# Patient Record
Sex: Male | Born: 1960 | Race: White | Hispanic: No | Marital: Married | Smoking: Former smoker
Health system: Southern US, Community
[De-identification: ages and names within clinical notes are randomized; demographics above are authoritative.]

## PROBLEM LIST (undated history)

## (undated) DIAGNOSIS — I480 Paroxysmal atrial fibrillation: Secondary | ICD-10-CM

## (undated) DIAGNOSIS — R918 Other nonspecific abnormal finding of lung field: Secondary | ICD-10-CM

## (undated) DIAGNOSIS — I340 Nonrheumatic mitral (valve) insufficiency: Secondary | ICD-10-CM

## (undated) DIAGNOSIS — I700 Atherosclerosis of aorta: Secondary | ICD-10-CM

## (undated) DIAGNOSIS — I499 Cardiac arrhythmia, unspecified: Secondary | ICD-10-CM

## (undated) DIAGNOSIS — I493 Ventricular premature depolarization: Secondary | ICD-10-CM

## (undated) DIAGNOSIS — I350 Nonrheumatic aortic (valve) stenosis: Secondary | ICD-10-CM

## (undated) DIAGNOSIS — C801 Malignant (primary) neoplasm, unspecified: Secondary | ICD-10-CM

## (undated) DIAGNOSIS — J189 Pneumonia, unspecified organism: Secondary | ICD-10-CM

## (undated) DIAGNOSIS — I4891 Unspecified atrial fibrillation: Secondary | ICD-10-CM

## (undated) DIAGNOSIS — I10 Essential (primary) hypertension: Secondary | ICD-10-CM

## (undated) DIAGNOSIS — I77810 Thoracic aortic ectasia: Secondary | ICD-10-CM

## (undated) DIAGNOSIS — K219 Gastro-esophageal reflux disease without esophagitis: Secondary | ICD-10-CM

## (undated) DIAGNOSIS — R931 Abnormal findings on diagnostic imaging of heart and coronary circulation: Secondary | ICD-10-CM

## (undated) DIAGNOSIS — R221 Localized swelling, mass and lump, neck: Secondary | ICD-10-CM

## (undated) DIAGNOSIS — E119 Type 2 diabetes mellitus without complications: Secondary | ICD-10-CM

## (undated) DIAGNOSIS — I6529 Occlusion and stenosis of unspecified carotid artery: Secondary | ICD-10-CM

## (undated) DIAGNOSIS — E7800 Pure hypercholesterolemia, unspecified: Secondary | ICD-10-CM

## (undated) DIAGNOSIS — I2510 Atherosclerotic heart disease of native coronary artery without angina pectoris: Secondary | ICD-10-CM

## (undated) DIAGNOSIS — Z87442 Personal history of urinary calculi: Secondary | ICD-10-CM

## (undated) HISTORY — DX: Cardiac arrhythmia, unspecified: I49.9

## (undated) HISTORY — PX: BACK SURGERY: SHX140

## (undated) HISTORY — DX: Occlusion and stenosis of unspecified carotid artery: I65.29

## (undated) HISTORY — DX: Abnormal findings on diagnostic imaging of heart and coronary circulation: R93.1

## (undated) HISTORY — DX: Malignant (primary) neoplasm, unspecified: C80.1

---

## 2003-06-22 ENCOUNTER — Ambulatory Visit: Admission: RE | Admit: 2003-06-22 | Discharge: 2003-06-22 | Payer: Self-pay | Admitting: Neurosurgery

## 2003-07-04 ENCOUNTER — Ambulatory Visit (HOSPITAL_COMMUNITY): Admission: RE | Admit: 2003-07-04 | Discharge: 2003-07-04 | Payer: Self-pay | Admitting: Neurosurgery

## 2003-08-01 ENCOUNTER — Encounter (HOSPITAL_COMMUNITY): Admission: RE | Admit: 2003-08-01 | Discharge: 2003-08-31 | Payer: Self-pay | Admitting: Neurosurgery

## 2003-09-18 ENCOUNTER — Encounter (HOSPITAL_COMMUNITY): Admission: RE | Admit: 2003-09-18 | Discharge: 2003-10-18 | Payer: Self-pay | Admitting: Neurosurgery

## 2003-10-19 ENCOUNTER — Encounter (HOSPITAL_COMMUNITY): Admission: RE | Admit: 2003-10-19 | Discharge: 2003-11-18 | Payer: Self-pay | Admitting: Neurosurgery

## 2003-11-20 ENCOUNTER — Encounter (HOSPITAL_COMMUNITY): Admission: RE | Admit: 2003-11-20 | Discharge: 2003-12-20 | Payer: Self-pay | Admitting: Neurosurgery

## 2003-12-22 ENCOUNTER — Encounter (HOSPITAL_COMMUNITY): Admission: RE | Admit: 2003-12-22 | Discharge: 2004-01-21 | Payer: Self-pay | Admitting: Neurosurgery

## 2004-01-22 ENCOUNTER — Encounter (HOSPITAL_COMMUNITY): Admission: RE | Admit: 2004-01-22 | Discharge: 2004-02-02 | Payer: Self-pay | Admitting: Neurosurgery

## 2012-11-23 ENCOUNTER — Other Ambulatory Visit (HOSPITAL_COMMUNITY): Payer: Self-pay | Admitting: Internal Medicine

## 2012-11-23 DIAGNOSIS — R319 Hematuria, unspecified: Secondary | ICD-10-CM

## 2012-11-24 ENCOUNTER — Ambulatory Visit (HOSPITAL_COMMUNITY)
Admission: RE | Admit: 2012-11-24 | Discharge: 2012-11-24 | Disposition: A | Discharge status: Home / Self Care | Payer: BC Managed Care – PPO | Source: Ambulatory Visit | Attending: Internal Medicine | Admitting: Internal Medicine

## 2012-11-24 DIAGNOSIS — R933 Abnormal findings on diagnostic imaging of other parts of digestive tract: Secondary | ICD-10-CM | POA: Insufficient documentation

## 2012-11-24 DIAGNOSIS — E278 Other specified disorders of adrenal gland: Secondary | ICD-10-CM | POA: Insufficient documentation

## 2012-11-24 DIAGNOSIS — R319 Hematuria, unspecified: Secondary | ICD-10-CM

## 2012-11-24 DIAGNOSIS — N400 Enlarged prostate without lower urinary tract symptoms: Secondary | ICD-10-CM | POA: Insufficient documentation

## 2012-11-24 LAB — POCT I-STAT, CHEM 8
Calcium, Ion: 1.19 mmol/L (ref 1.12–1.23)
Glucose, Bld: 118 mg/dL — ABNORMAL HIGH (ref 70–99)
HCT: 47 % (ref 39.0–52.0)
Hemoglobin: 16 g/dL (ref 13.0–17.0)
TCO2: 27 mmol/L (ref 0–100)

## 2012-11-24 MED ORDER — IOHEXOL 300 MG/ML  SOLN: 150.0000 mL | Freq: Once | INTRAMUSCULAR | Status: DC | PRN

## 2012-11-24 MED ORDER — IOHEXOL 300 MG/ML  SOLN
125.0000 mL | Freq: Once | INTRAMUSCULAR | Status: AC | PRN
  Administered 2012-11-24: 125 mL via INTRAVENOUS

## 2015-10-18 ENCOUNTER — Encounter (HOSPITAL_COMMUNITY): Payer: Self-pay

## 2015-10-18 ENCOUNTER — Emergency Department (HOSPITAL_COMMUNITY): Payer: BLUE CROSS/BLUE SHIELD

## 2015-10-18 ENCOUNTER — Emergency Department (HOSPITAL_COMMUNITY)
Admission: EM | Admit: 2015-10-18 | Discharge: 2015-10-18 | Disposition: A | Discharge status: Home / Self Care | Payer: BLUE CROSS/BLUE SHIELD | Attending: Emergency Medicine | Admitting: Emergency Medicine

## 2015-10-18 DIAGNOSIS — Z79891 Long term (current) use of opiate analgesic: Secondary | ICD-10-CM | POA: Insufficient documentation

## 2015-10-18 DIAGNOSIS — W182XXA Fall in (into) shower or empty bathtub, initial encounter: Secondary | ICD-10-CM | POA: Insufficient documentation

## 2015-10-18 DIAGNOSIS — F17200 Nicotine dependence, unspecified, uncomplicated: Secondary | ICD-10-CM | POA: Insufficient documentation

## 2015-10-18 DIAGNOSIS — S0081XA Abrasion of other part of head, initial encounter: Secondary | ICD-10-CM | POA: Diagnosis not present

## 2015-10-18 DIAGNOSIS — Y939 Activity, unspecified: Secondary | ICD-10-CM | POA: Diagnosis not present

## 2015-10-18 DIAGNOSIS — S299XXA Unspecified injury of thorax, initial encounter: Secondary | ICD-10-CM | POA: Diagnosis present

## 2015-10-18 DIAGNOSIS — E119 Type 2 diabetes mellitus without complications: Secondary | ICD-10-CM | POA: Diagnosis not present

## 2015-10-18 DIAGNOSIS — Z79899 Other long term (current) drug therapy: Secondary | ICD-10-CM | POA: Diagnosis not present

## 2015-10-18 DIAGNOSIS — S2242XA Multiple fractures of ribs, left side, initial encounter for closed fracture: Secondary | ICD-10-CM | POA: Diagnosis not present

## 2015-10-18 DIAGNOSIS — Y929 Unspecified place or not applicable: Secondary | ICD-10-CM | POA: Insufficient documentation

## 2015-10-18 DIAGNOSIS — Z7984 Long term (current) use of oral hypoglycemic drugs: Secondary | ICD-10-CM | POA: Diagnosis not present

## 2015-10-18 DIAGNOSIS — S2232XA Fracture of one rib, left side, initial encounter for closed fracture: Secondary | ICD-10-CM

## 2015-10-18 DIAGNOSIS — Y999 Unspecified external cause status: Secondary | ICD-10-CM | POA: Insufficient documentation

## 2015-10-18 HISTORY — DX: Type 2 diabetes mellitus without complications: E11.9

## 2015-10-18 MED ORDER — METHOCARBAMOL 500 MG PO TABS
1000.0000 mg | ORAL_TABLET | Freq: Once | ORAL | Status: AC
  Administered 2015-10-18: 1000 mg via ORAL
  Filled 2015-10-18: qty 2

## 2015-10-18 MED ORDER — HYDROCODONE-ACETAMINOPHEN 7.5-325 MG PO TABS: 1.0000 | ORAL_TABLET | ORAL | Status: DC | PRN

## 2015-10-18 MED ORDER — METHOCARBAMOL 500 MG PO TABS: 500.0000 mg | ORAL_TABLET | Freq: Three times a day (TID) | ORAL | Status: DC

## 2015-10-18 MED ORDER — HYDROCODONE-ACETAMINOPHEN 5-325 MG PO TABS
2.0000 | ORAL_TABLET | Freq: Once | ORAL | Status: AC
  Administered 2015-10-18: 2 via ORAL
  Filled 2015-10-18: qty 2

## 2015-10-18 NOTE — ED Provider Notes (Signed)
CSN: [id number]     Arrival date & time 10/18/15  1253 History   First MD Initiated Contact with Patient 10/18/15 1432     Chief Complaint  Patient presents with  . Fall     (Consider location/radiation/quality/duration/timing/severity/associated sxs/prior Treatment) HPI Comments: Patient is a 55 year old male who presents to the emergency department with a complaint of left rib pain, an abrasion to the left face.  The patient states that on last evening he slipped in the bathtub, fell and injured the left ribs. He also struck his left face and sustained an abrasion. He states that today he has a very minimal discomfort as far as his face is concerned, but has increasing pain through the night and into today in the left rib area. He denies any hemoptysis. He denies any difficulty with breathing, but states that it hurts to take a deep breath or cough. He denies being on any anticoagulation medications.  Patient is a 55 y.o. male presenting with fall. The history is provided by the patient.  Fall This is a new problem. The current episode started yesterday.    Past Medical History  Diagnosis Date  . Diabetes mellitus without complication [facility])    Past Surgical History  Procedure Laterality Date  . Back surgery     No family history on file. Social History  Substance Use Topics  . Smoking status: Current Every Day Smoker  . Smokeless tobacco: None  . Alcohol Use: No    Review of Systems  Respiratory:       Chest wall pain  Musculoskeletal: Positive for back pain.  All other systems reviewed and are negative.     Allergies  Review of patient's allergies indicates no known allergies.  Home Medications   Prior to Admission medications   Medication Sig Start Date End Date Taking? Authorizing Provider  ALPRAZolam Duanne Moron) 1 MG tablet Take 1 mg by mouth 3 (three) times daily.  09/25/15  Yes Historical Provider, MD  atorvastatin (LIPITOR) 10 MG tablet Take 10 mg by mouth  daily. 10/10/15  Yes Historical Provider, MD  esomeprazole (NEXIUM) 40 MG capsule Take 40 mg by mouth daily. 10/10/15  Yes Historical Provider, MD  HYDROcodone-acetaminophen (NORCO) 10-325 MG tablet take 1 tablet by mouth every 6 hours if needed 09/18/15  Yes Historical Provider, MD  lisinopril (PRINIVIL,ZESTRIL) 20 MG tablet Take 20 mg by mouth at bedtime.  09/28/15  Yes Historical Provider, MD  metFORMIN (GLUCOPHAGE) 1000 MG tablet Take 1,000 mg by mouth 2 (two) times daily. 10/10/15  Yes Historical Provider, MD  pioglitazone (ACTOS) 15 MG tablet Take 15 mg by mouth at bedtime.  09/21/15  Yes Historical Provider, MD   BP 140/78 mmHg  Pulse 83  Temp(Src) 97.8 F (36.6 C) (Oral)  Resp 18  Ht 5\' 10"  (1.778 m)  Wt 96.163 kg  BMI 30.42 kg/m2  SpO2 98% Physical Exam  Constitutional: He is oriented to person, place, and time. He appears well-developed and well-nourished.  Non-toxic appearance.  HENT:  Head: Normocephalic.  Right Ear: Tympanic membrane and external ear normal.  Left Ear: Tympanic membrane and external ear normal.  There is an abrasion of the left cheek. There is no deformity appreciated.     Eyes: EOM and lids are normal. Pupils are equal, round, and reactive to light.  Neck: Normal range of motion. Neck supple. Carotid bruit is not present.  Cardiovascular: Normal rate, regular rhythm, normal heart sounds, intact distal pulses and normal pulses.  Pulmonary/Chest: Breath sounds normal. No respiratory distress.  There is a bruise to the left lateral chest wall on. There is pain to palpation of the left lateral and anterior ribs on. There is symmetrical rise and fall of the chest. There is pain with deep breathing on the left side.  Abdominal: Soft. Bowel sounds are normal. There is no tenderness. There is no guarding.  Musculoskeletal: Normal range of motion.  Lymphadenopathy:       Head (right side): No submandibular adenopathy present.       Head (left side): No submandibular  adenopathy present.    He has no cervical adenopathy.  Neurological: He is alert and oriented to person, place, and time. He has normal strength. No cranial nerve deficit or sensory deficit.  Skin: Skin is warm and dry.  Psychiatric: He has a normal mood and affect. His speech is normal.  Nursing note and vitals reviewed.   ED Course  Procedures (including critical care time)  FRACTURE CARE Patient sustained a fall on last evening and unfortunately broke to possibly 3 ribs on the left. I discussed the fractures with the patient in terms which he understands. His pulse oximetry is 98% on room air. He speaks in complete sentences without problem.  Discussed with the patient the need for incentive spirometry as part of his treatment for the rib fractures. The patient identified by arm band. Incentive spirometer ordered for the patient. Patient treated for pain with Robaxin and Norco. The patient expresses understanding of the discharge instructions. Labs Review Labs Reviewed - No data to display  Imaging Review Dg Ribs Unilateral W/chest Left  10/18/2015  CLINICAL DATA:  [place] out of tub last night, left posterior rib pain EXAM: LEFT RIBS AND CHEST - 3+ VIEW COMPARISON:  None. FINDINGS: Three views left ribs submitted. No infiltrate or pulmonary edema. There is no pneumothorax. There is nondisplaced fracture of the left seventh rib. Mild displaced fracture of the left eighth rib. Question nondisplaced fracture of of the left ninth rib air. IMPRESSION: No infiltrate or pulmonary edema. Nondisplaced fracture of the left seventh rib. Mild displaced fracture of the left eighth rib. Question nondisplaced fracture of the left ninth rib. No pneumothorax. Electronically Signed   By: Lahoma Crocker M.D.   On: 10/18/2015 13:29   I have personally reviewed and evaluated these images and lab results as part of my medical decision-making.   EKG Interpretation None      MDM  Vital signs within normal  limits. The pulse oximetry is 98% on room air. The x-ray reveals a fracture of the left seventh and eighth rib. There is also a question of fracture of the ninth rib.  The patient will be treated with an incentive spirometer, antispasmodic, and pain medication. I discussed with the patient the importance of frequent deep breaths. Also discussed with him that it'll probably take 6-8 weeks for this to heal. The patient is to follow-up with his primary physician to monitor this while healing. The patient is in agreement with this discharge plan.    Final diagnoses:  None    *I have reviewed nursing notes, vital signs, and all appropriate lab and imaging results for this patient.**    Lily Kocher, PA-C 10/18/15 [place], PA-C 10/18/15 1517  Virgel Manifold, MD 10/30/15 [phone]

## 2015-10-18 NOTE — ED Notes (Signed)
Patient stating he is wanting to leave. Nurse made multiple attempts to obtain incentive spirometer from respiratory. Instructed patient to cough turn and deep breath every 3-4 hours. Patient verbalizes understanding.

## 2015-10-18 NOTE — Discharge Instructions (Signed)
Your x-ray reveals a fracture of the left seventh and eighth rib. There is also a suspicion of an area on the ninth rib. Please use the incentive spirometer 3 or 4 times each hour. Please see Dr. Willey Blade, or member of his team to monitor your healing, and also to assist you with pain management. Please use 600 mg of ibuprofen with breakfast, lunch, and dinner. Use Robaxin 3 times daily for the spasm pain associated with your injury. May use Norco for more severe pain. Robaxin and Norco may cause drowsiness, please use these medications with caution. Rib Fracture A rib fracture is a break or crack in one of the bones of the ribs. The ribs are like a cage that goes around your upper chest. A broken or cracked rib is often painful, but most do not cause other problems. Most rib fractures heal on their own in 1-3 months. HOME CARE  Avoid activities that cause pain to the injured area. Protect your injured area.  Slowly increase activity as told by your doctor.  Take medicine as told by your doctor.  Put ice on the injured area for the first 1-2 days after you have been treated or as told by your doctor.  Put ice in a plastic bag.  Place a towel between your skin and the bag.  Leave the ice on for 15-20 minutes at a time, every 2 hours while you are awake.  Do deep breathing as told by your doctor. You may be told to:  Take deep breaths many times a day.  Cough many times a day while hugging a pillow.  Use a device (incentive spirometer) to perform deep breathing many times a day.  Drink enough fluids to keep your pee (urine) clear or pale yellow.   Do not wear a rib belt or binder. These do not allow you to breathe deeply. GET HELP RIGHT AWAY IF:   You have a fever.  You have trouble breathing.   You cannot stop coughing.  You cough up thick or bloody spit (mucus).   You feel sick to your stomach (nauseous), throw up (vomit), or have belly (abdominal) pain.   Your pain gets  worse and medicine does not help.  MAKE SURE YOU:   Understand these instructions.  Will watch your condition.  Will get help right away if you are not doing well or get worse.   This information is not intended to replace advice given to you by your health care provider. Make sure you discuss any questions you have with your health care provider.   Document Released: 01/29/2008 Document Revised: 08/16/2012 Document Reviewed: 06/23/2012 Elsevier Interactive Patient Education [employer].

## 2015-10-18 NOTE — ED Notes (Signed)
Pt reports slipped in the tub and fell and c/o pain to left ribs.  Pt also has abrasion to left side of face but says the pain in his side is worse.  Denies any LOC.

## 2015-10-24 ENCOUNTER — Emergency Department (HOSPITAL_COMMUNITY): Payer: BLUE CROSS/BLUE SHIELD

## 2015-10-24 ENCOUNTER — Encounter (HOSPITAL_COMMUNITY): Payer: Self-pay

## 2015-10-24 ENCOUNTER — Emergency Department (HOSPITAL_COMMUNITY)
Admission: EM | Admit: 2015-10-24 | Discharge: 2015-10-24 | Disposition: A | Discharge status: Home / Self Care | Payer: BLUE CROSS/BLUE SHIELD | Attending: Emergency Medicine | Admitting: Emergency Medicine

## 2015-10-24 DIAGNOSIS — S299XXA Unspecified injury of thorax, initial encounter: Secondary | ICD-10-CM | POA: Diagnosis present

## 2015-10-24 DIAGNOSIS — S2242XD Multiple fractures of ribs, left side, subsequent encounter for fracture with routine healing: Secondary | ICD-10-CM | POA: Insufficient documentation

## 2015-10-24 DIAGNOSIS — Y92002 Bathroom of unspecified non-institutional (private) residence single-family (private) house as the place of occurrence of the external cause: Secondary | ICD-10-CM | POA: Insufficient documentation

## 2015-10-24 DIAGNOSIS — R109 Unspecified abdominal pain: Secondary | ICD-10-CM | POA: Diagnosis not present

## 2015-10-24 DIAGNOSIS — W19XXXD Unspecified fall, subsequent encounter: Secondary | ICD-10-CM | POA: Insufficient documentation

## 2015-10-24 DIAGNOSIS — Z7984 Long term (current) use of oral hypoglycemic drugs: Secondary | ICD-10-CM | POA: Insufficient documentation

## 2015-10-24 DIAGNOSIS — Y9301 Activity, walking, marching and hiking: Secondary | ICD-10-CM | POA: Diagnosis not present

## 2015-10-24 DIAGNOSIS — F17200 Nicotine dependence, unspecified, uncomplicated: Secondary | ICD-10-CM | POA: Insufficient documentation

## 2015-10-24 DIAGNOSIS — Y999 Unspecified external cause status: Secondary | ICD-10-CM | POA: Insufficient documentation

## 2015-10-24 DIAGNOSIS — E119 Type 2 diabetes mellitus without complications: Secondary | ICD-10-CM | POA: Diagnosis not present

## 2015-10-24 LAB — CBC WITH DIFFERENTIAL/PLATELET
BASOS ABS: 0 10*3/uL (ref 0.0–0.1)
BASOS PCT: 0 %
EOS ABS: 0.9 10*3/uL — AB (ref 0.0–0.7)
Eosinophils Relative: 8 %
HEMATOCRIT: 44.6 % (ref 39.0–52.0)
HEMOGLOBIN: 15.5 g/dL (ref 13.0–17.0)
Lymphocytes Relative: 24 %
Lymphs Abs: 2.7 10*3/uL (ref 0.7–4.0)
MCH: 31.8 pg (ref 26.0–34.0)
MCHC: 34.8 g/dL (ref 30.0–36.0)
MCV: 91.6 fL (ref 78.0–100.0)
Monocytes Absolute: 0.7 10*3/uL (ref 0.1–1.0)
Monocytes Relative: 6 %
NEUTROS ABS: 7 10*3/uL (ref 1.7–7.7)
NEUTROS PCT: 62 %
Platelets: 288 10*3/uL (ref 150–400)
RBC: 4.87 MIL/uL (ref 4.22–5.81)
RDW: 13.5 % (ref 11.5–15.5)
WBC: 11.3 10*3/uL — AB (ref 4.0–10.5)

## 2015-10-24 LAB — COMPREHENSIVE METABOLIC PANEL
ALBUMIN: 4.4 g/dL (ref 3.5–5.0)
ALK PHOS: 71 U/L (ref 38–126)
ALT: 27 U/L (ref 17–63)
AST: 22 U/L (ref 15–41)
Anion gap: 10 (ref 5–15)
BILIRUBIN TOTAL: 0.6 mg/dL (ref 0.3–1.2)
BUN: 17 mg/dL (ref 6–20)
CALCIUM: 9.3 mg/dL (ref 8.9–10.3)
CO2: 25 mmol/L (ref 22–32)
Chloride: 99 mmol/L — ABNORMAL LOW (ref 101–111)
Creatinine, Ser: 0.95 mg/dL (ref 0.61–1.24)
GFR calc Af Amer: >60 mL/min (ref >60)
GFR calc non Af Amer: >60 mL/min (ref >60)
GLUCOSE: 171 mg/dL — AB (ref 65–99)
POTASSIUM: 4.3 mmol/L (ref 3.5–5.1)
SODIUM: 134 mmol/L — AB (ref 135–145)
TOTAL PROTEIN: 8.1 g/dL (ref 6.5–8.1)

## 2015-10-24 LAB — URINALYSIS, ROUTINE W REFLEX MICROSCOPIC
BILIRUBIN URINE: NEGATIVE
GLUCOSE, UA: NEGATIVE mg/dL
HGB URINE DIPSTICK: NEGATIVE
KETONES UR: NEGATIVE mg/dL
LEUKOCYTES UA: NEGATIVE
Nitrite: NEGATIVE
PH: 5.5 (ref 5.0–8.0)
Protein, ur: NEGATIVE mg/dL
Specific Gravity, Urine: 1.025 (ref 1.005–1.030)

## 2015-10-24 MED ORDER — IOPAMIDOL (ISOVUE-300) INJECTION 61%
100.0000 mL | Freq: Once | INTRAVENOUS | Status: AC | PRN
  Administered 2015-10-24: 100 mL via INTRAVENOUS

## 2015-10-24 NOTE — ED Notes (Signed)
Pt left ED, ambulatory with no signs of distress. Pt verbalized discharge instructions. 

## 2015-10-24 NOTE — ED Notes (Signed)
Pt has large dark bruise to left lower back and another bruise to left mid back.

## 2015-10-24 NOTE — ED Provider Notes (Signed)
CSN: [id number]     Arrival date & time 10/24/15  0755 History   First MD Initiated Contact with Patient 10/24/15 0804     Chief Complaint  Patient presents with  . Back Pain     (Consider location/radiation/quality/duration/timing/severity/associated sxs/prior Treatment) HPI   Trevor Steele is a 55 y.o. male who presents to the Emergency Department complaining of new onset of bruising to the left flank secondary to a fall in the bathroom that occurred one week ago.  He was seen here previously and diagnosed with left rib fractures.  He noticed a large bruise develop to his left lower back and states the area has a "knot" there.  He has been taking pain medications with significant relief.  He denies abdominal pain, vomiting, hematuria, difficulty urinating, pain numbness or weakness of the LE's.     Past Medical History  Diagnosis Date  . Diabetes mellitus without complication [facility])    Past Surgical History  Procedure Laterality Date  . Back surgery     No family history on file. Social History  Substance Use Topics  . Smoking status: Current Every Day Smoker  . Smokeless tobacco: None  . Alcohol Use: No    Review of Systems  Constitutional: Negative for fever and chills.  Respiratory: Negative for chest tightness and shortness of breath.   Cardiovascular: Negative for chest pain (left rib pain).  Gastrointestinal: Negative for nausea, vomiting and abdominal pain.  Genitourinary: Positive for flank pain. Negative for dysuria, hematuria, penile swelling, scrotal swelling, difficulty urinating and testicular pain.  Skin: Negative for rash.  Neurological: Negative for dizziness, weakness and numbness.  All other systems reviewed and are negative.     Allergies  Review of patient's allergies indicates no known allergies.  Home Medications   Prior to Admission medications   Medication Sig Start Date End Date Taking? Authorizing Provider  ALPRAZolam Duanne Moron) 1 MG  tablet Take 1 mg by mouth 3 (three) times daily.  09/25/15   Historical Provider, MD  atorvastatin (LIPITOR) 10 MG tablet Take 10 mg by mouth daily. 10/10/15   Historical Provider, MD  esomeprazole (NEXIUM) 40 MG capsule Take 40 mg by mouth daily. 10/10/15   Historical Provider, MD  HYDROcodone-acetaminophen (NORCO) 7.5-325 MG tablet Take 1 tablet by mouth every 4 (four) hours as needed. 10/18/15   Lily Kocher, PA-C  lisinopril (PRINIVIL,ZESTRIL) 20 MG tablet Take 20 mg by mouth at bedtime.  09/28/15   Historical Provider, MD  metFORMIN (GLUCOPHAGE) 1000 MG tablet Take 1,000 mg by mouth 2 (two) times daily. 10/10/15   Historical Provider, MD  methocarbamol (ROBAXIN) 500 MG tablet Take 1 tablet (500 mg total) by mouth 3 (three) times daily. 10/18/15   Lily Kocher, PA-C  pioglitazone (ACTOS) 15 MG tablet Take 15 mg by mouth at bedtime.  09/21/15   Historical Provider, MD   BP 122/82 mmHg  Pulse 72  Temp(Src) 97.7 F (36.5 C) (Oral)  Resp 18  Ht 5\' 10"  (1.778 m)  Wt 96.163 kg  BMI 30.42 kg/m2  SpO2 96% Physical Exam  Constitutional: He is oriented to person, place, and time. He appears well-developed and well-nourished. No distress.  HENT:  Head: Normocephalic and atraumatic.  Neck: Normal range of motion. Neck supple.  Cardiovascular: Normal rate, regular rhythm and intact distal pulses.   No murmur heard. Pulmonary/Chest: Effort normal and breath sounds normal. No respiratory distress. He exhibits tenderness (ttp of the left lateral chest wall, no guarding or crepitus).  Abdominal:  Soft. He exhibits no distension. There is no rebound and no guarding.  ttp of the left flank with large ecchymotic area just above the iliac crest with small central area of induration.  No tenderness over the spleen   Musculoskeletal: He exhibits tenderness. He exhibits no edema.       Lumbar back: He exhibits tenderness and pain. He exhibits normal range of motion, no swelling, no deformity, no laceration and normal  pulse.   No spinal tenderness.  DP pulses are brisk and symmetrical.  Distal sensation intact.  Neg SLR bilaterally.  Pt has 5/5 strength against resistance of bilateral lower extremities.     Neurological: He is alert and oriented to person, place, and time. He has normal strength. No sensory deficit. He exhibits normal muscle tone. Coordination and gait normal.  Reflex Scores:      Patellar reflexes are 2+ on the right side and 2+ on the left side.      Achilles reflexes are 2+ on the right side and 2+ on the left side. Skin: Skin is warm and dry. No rash noted.  Nursing note and vitals reviewed.   ED Course  Procedures (including critical care time) Labs Review Labs Reviewed  CBC WITH DIFFERENTIAL/PLATELET - Abnormal; Notable for the following:    WBC 11.3 (*)    Eosinophils Absolute 0.9 (*)    All other components within normal limits  COMPREHENSIVE METABOLIC PANEL - Abnormal; Notable for the following:    Sodium 134 (*)    Chloride 99 (*)    Glucose, Bld 171 (*)    All other components within normal limits  URINALYSIS, ROUTINE W REFLEX MICROSCOPIC (NOT AT [facility])    Imaging Review Ct Abdomen Pelvis W Contrast  10/24/2015  CLINICAL DATA:  Status post fall, pain, bruising, swelling of the low back EXAM: CT ABDOMEN AND PELVIS WITH CONTRAST TECHNIQUE: Multidetector CT imaging of the abdomen and pelvis was performed using the standard protocol following bolus administration of intravenous contrast. CONTRAST:  1100mL ISOVUE-300 IOPAMIDOL (ISOVUE-300) INJECTION 61% COMPARISON:  CT abdomen 11/24/2012 FINDINGS: Lower chest:  Small left pleural effusion.  Normal heart size. Hepatobiliary: Normal liver.  Normal gallbladder. Pancreas: Normal. Spleen: Normal. Adrenals/Urinary Tract: . Stable 2.6 x 2.8 cm left adrenal mass most consistent with an adrenal adenoma. Bilateral adrenal thickening consistent with adrenal hyperplasia. Normal kidneys. No obstructive uropathy. No urolithiasis or obstructive  uropathy. Normal bladder. Stomach/Bowel: No bowel wall thickening or bowel dilatation. No pneumatosis, pneumoperitoneum or portal venous gas. No abdominal or pelvic free fluid. Vascular/Lymphatic: Normal caliber abdominal aorta with atherosclerosis. No lymphadenopathy. Other: No fluid collection or hematoma. Musculoskeletal: No aggressive lytic or sclerotic osseous lesion. Nondisplaced fracture of the left posterior twelfth rib at the costovertebral junction. Nondisplaced fracture of the left posterolateral ninth rib. Displaced fracture of the left posterolateral eighth rib. Nondisplaced fracture of the left posterolateral seventh rib. Degenerative changes of the left SI joint with a small anterior bridging osteophyte. Degenerative disc disease with disc height loss and broad-based disc osteophyte complex at L4-5 and L5-S1. Bilateral facet arthropathy at L4-5 and L5-S1. IMPRESSION: 1. Nondisplaced fracture of the left posterior twelfth rib at the costovertebral junction. Nondisplaced fracture of the left posterolateral ninth rib. Displaced fracture of the left posterolateral eighth rib. Nondisplaced fracture of the left posterolateral seventh rib. Small left pleural effusion. 2. Stable left adrenal mass consistent with adrenal adenoma. Electronically Signed   By: Kathreen Devoid   On: 10/24/2015 11:03   I have personally  reviewed and evaluated these images and lab results as part of my medical decision-making.   EKG Interpretation None      MDM   Final diagnoses:  Multiple rib fractures, left, with routine healing, subsequent encounter    Previous ER records reviewed by me and inserted my MDM.  Patient is well-appearing and nontoxic. No distress. CT scan of abdomen and pelvis revealed fractures of seventh eighth and ninth rib as well as the 12th rib. No pneumothorax.  Patient appears stable for discharge, he has pain medication and Robaxin at home. He agrees to PMD follow-up in ER return if needed,  advised to take deep breaths and use firm pressure when coughing.  Kem Parkinson, PA-C 10/26/15 1404  Julianne Rice, MD 10/27/15 [phone]

## 2015-10-24 NOTE — ED Notes (Signed)
Patient ambulated to bathroom with no assistance or difficulty. 

## 2015-10-24 NOTE — ED Notes (Signed)
Pt fell a week ago and has noticed pain, bruising, and swelling to lower back.  Pt wants back evaluated.  Reports has rib fracture.

## 2015-10-24 NOTE — Discharge Instructions (Signed)

## 2016-05-26 DIAGNOSIS — F17210 Nicotine dependence, cigarettes, uncomplicated: Secondary | ICD-10-CM | POA: Diagnosis not present

## 2016-05-26 DIAGNOSIS — Z6830 Body mass index (BMI) 30.0-30.9, adult: Secondary | ICD-10-CM | POA: Diagnosis not present

## 2016-05-26 DIAGNOSIS — I10 Essential (primary) hypertension: Secondary | ICD-10-CM | POA: Diagnosis not present

## 2016-05-26 DIAGNOSIS — E119 Type 2 diabetes mellitus without complications: Secondary | ICD-10-CM | POA: Diagnosis not present

## 2016-05-26 DIAGNOSIS — Z23 Encounter for immunization: Secondary | ICD-10-CM | POA: Diagnosis not present

## 2016-09-25 DIAGNOSIS — Z79899 Other long term (current) drug therapy: Secondary | ICD-10-CM | POA: Diagnosis not present

## 2016-09-25 DIAGNOSIS — Z125 Encounter for screening for malignant neoplasm of prostate: Secondary | ICD-10-CM | POA: Diagnosis not present

## 2016-09-25 DIAGNOSIS — E119 Type 2 diabetes mellitus without complications: Secondary | ICD-10-CM | POA: Diagnosis not present

## 2016-09-25 DIAGNOSIS — I10 Essential (primary) hypertension: Secondary | ICD-10-CM | POA: Diagnosis not present

## 2016-10-02 DIAGNOSIS — I10 Essential (primary) hypertension: Secondary | ICD-10-CM | POA: Diagnosis not present

## 2016-10-02 DIAGNOSIS — E119 Type 2 diabetes mellitus without complications: Secondary | ICD-10-CM | POA: Diagnosis not present

## 2016-12-23 DIAGNOSIS — M25512 Pain in left shoulder: Secondary | ICD-10-CM | POA: Diagnosis not present

## 2017-02-21 DIAGNOSIS — E119 Type 2 diabetes mellitus without complications: Secondary | ICD-10-CM | POA: Diagnosis not present

## 2017-02-27 DIAGNOSIS — L723 Sebaceous cyst: Secondary | ICD-10-CM | POA: Diagnosis not present

## 2017-02-27 DIAGNOSIS — Z23 Encounter for immunization: Secondary | ICD-10-CM | POA: Diagnosis not present

## 2017-02-27 DIAGNOSIS — E119 Type 2 diabetes mellitus without complications: Secondary | ICD-10-CM | POA: Diagnosis not present

## 2017-07-18 DIAGNOSIS — E119 Type 2 diabetes mellitus without complications: Secondary | ICD-10-CM | POA: Diagnosis not present

## 2017-07-24 DIAGNOSIS — E119 Type 2 diabetes mellitus without complications: Secondary | ICD-10-CM | POA: Diagnosis not present

## 2017-07-24 DIAGNOSIS — I10 Essential (primary) hypertension: Secondary | ICD-10-CM | POA: Diagnosis not present

## 2017-07-24 DIAGNOSIS — Z6831 Body mass index (BMI) 31.0-31.9, adult: Secondary | ICD-10-CM | POA: Diagnosis not present

## 2018-01-13 DIAGNOSIS — M791 Myalgia, unspecified site: Secondary | ICD-10-CM | POA: Diagnosis not present

## 2018-01-14 DIAGNOSIS — M2550 Pain in unspecified joint: Secondary | ICD-10-CM | POA: Diagnosis not present

## 2018-03-01 DIAGNOSIS — Z125 Encounter for screening for malignant neoplasm of prostate: Secondary | ICD-10-CM | POA: Diagnosis not present

## 2018-03-01 DIAGNOSIS — K219 Gastro-esophageal reflux disease without esophagitis: Secondary | ICD-10-CM | POA: Diagnosis not present

## 2018-03-01 DIAGNOSIS — E119 Type 2 diabetes mellitus without complications: Secondary | ICD-10-CM | POA: Diagnosis not present

## 2018-03-01 DIAGNOSIS — E785 Hyperlipidemia, unspecified: Secondary | ICD-10-CM | POA: Diagnosis not present

## 2018-03-01 DIAGNOSIS — F419 Anxiety disorder, unspecified: Secondary | ICD-10-CM | POA: Diagnosis not present

## 2018-03-01 DIAGNOSIS — I10 Essential (primary) hypertension: Secondary | ICD-10-CM | POA: Diagnosis not present

## 2018-03-08 DIAGNOSIS — Z23 Encounter for immunization: Secondary | ICD-10-CM | POA: Diagnosis not present

## 2018-03-08 DIAGNOSIS — E1169 Type 2 diabetes mellitus with other specified complication: Secondary | ICD-10-CM | POA: Diagnosis not present

## 2018-03-08 DIAGNOSIS — E785 Hyperlipidemia, unspecified: Secondary | ICD-10-CM | POA: Diagnosis not present

## 2018-08-13 DIAGNOSIS — E1159 Type 2 diabetes mellitus with other circulatory complications: Secondary | ICD-10-CM | POA: Diagnosis not present

## 2018-08-19 DIAGNOSIS — N529 Male erectile dysfunction, unspecified: Secondary | ICD-10-CM | POA: Diagnosis not present

## 2018-08-19 DIAGNOSIS — E1159 Type 2 diabetes mellitus with other circulatory complications: Secondary | ICD-10-CM | POA: Diagnosis not present

## 2018-12-08 DIAGNOSIS — H6692 Otitis media, unspecified, left ear: Secondary | ICD-10-CM | POA: Diagnosis not present

## 2019-01-03 ENCOUNTER — Other Ambulatory Visit: Payer: Self-pay

## 2019-01-03 DIAGNOSIS — Z20822 Contact with and (suspected) exposure to covid-19: Secondary | ICD-10-CM

## 2019-01-04 DIAGNOSIS — J111 Influenza due to unidentified influenza virus with other respiratory manifestations: Secondary | ICD-10-CM | POA: Diagnosis not present

## 2019-01-05 LAB — NOVEL CORONAVIRUS, NAA: SARS-CoV-2, NAA: NOT DETECTED

## 2019-02-09 DIAGNOSIS — Z79899 Other long term (current) drug therapy: Secondary | ICD-10-CM | POA: Diagnosis not present

## 2019-02-09 DIAGNOSIS — E1159 Type 2 diabetes mellitus with other circulatory complications: Secondary | ICD-10-CM | POA: Diagnosis not present

## 2019-02-15 DIAGNOSIS — E1159 Type 2 diabetes mellitus with other circulatory complications: Secondary | ICD-10-CM | POA: Diagnosis not present

## 2019-02-15 DIAGNOSIS — Z23 Encounter for immunization: Secondary | ICD-10-CM | POA: Diagnosis not present

## 2019-02-15 DIAGNOSIS — I10 Essential (primary) hypertension: Secondary | ICD-10-CM | POA: Diagnosis not present

## 2019-03-08 ENCOUNTER — Other Ambulatory Visit: Payer: Self-pay | Admitting: *Deleted

## 2019-03-08 DIAGNOSIS — Z20822 Contact with and (suspected) exposure to covid-19: Secondary | ICD-10-CM

## 2019-03-09 LAB — NOVEL CORONAVIRUS, NAA: SARS-CoV-2, NAA: NOT DETECTED

## 2019-06-15 ENCOUNTER — Ambulatory Visit: Payer: BLUE CROSS/BLUE SHIELD | Attending: Internal Medicine

## 2019-06-15 ENCOUNTER — Other Ambulatory Visit: Payer: Self-pay

## 2019-06-15 DIAGNOSIS — Z20822 Contact with and (suspected) exposure to covid-19: Secondary | ICD-10-CM

## 2019-06-16 LAB — NOVEL CORONAVIRUS, NAA: SARS-CoV-2, NAA: NOT DETECTED

## 2019-07-29 ENCOUNTER — Ambulatory Visit: Payer: BLUE CROSS/BLUE SHIELD | Attending: Internal Medicine

## 2019-07-29 DIAGNOSIS — Z23 Encounter for immunization: Secondary | ICD-10-CM

## 2019-07-29 NOTE — Progress Notes (Signed)
   Covid-19 Vaccination Clinic  Name:  Trevor Steele    MRN: [id number] DOB: 06-Dec-1960  07/29/2019  [name] was observed post Covid-19 immunization for 15 minutes without incident. He was provided with Vaccine Information Sheet and instruction to access the V-Safe system.   [name] was instructed to call 911 with any severe reactions post vaccine: Marland Kitchen Difficulty breathing  . Swelling of face and throat  . A fast heartbeat  . A bad rash all over body  . Dizziness and weakness   Immunizations Administered    Name Date Dose VIS Date Route   Moderna COVID-19 Vaccine 07/29/2019  9:25 AM 0.5 mL 04/05/2019 Intramuscular   Manufacturer: Moderna   Lot: [id number]   [id number]

## 2019-08-31 ENCOUNTER — Ambulatory Visit: Payer: BLUE CROSS/BLUE SHIELD | Attending: Internal Medicine

## 2019-08-31 DIAGNOSIS — Z23 Encounter for immunization: Secondary | ICD-10-CM

## 2019-08-31 NOTE — Progress Notes (Signed)
   Covid-19 Vaccination Clinic  Name:  Trevor Steele    MRN: [id number] DOB: 1960/07/15  08/31/2019  [name] was observed post Covid-19 immunization for 15 minutes without incident. He was provided with Vaccine Information Sheet and instruction to access the V-Safe system.   [name] was instructed to call 911 with any severe reactions post vaccine: Marland Kitchen Difficulty breathing  . Swelling of face and throat  . A fast heartbeat  . A bad rash all over body  . Dizziness and weakness   Immunizations Administered    Name Date Dose VIS Date Route   Moderna COVID-19 Vaccine 08/31/2019  9:45 AM 0.5 mL 04/2019 Intramuscular   Manufacturer: Moderna   Lot: [id number]   [id number]

## 2019-12-09 DIAGNOSIS — M25512 Pain in left shoulder: Secondary | ICD-10-CM | POA: Diagnosis not present

## 2020-01-06 DIAGNOSIS — Z79899 Other long term (current) drug therapy: Secondary | ICD-10-CM | POA: Diagnosis not present

## 2020-01-06 DIAGNOSIS — N529 Male erectile dysfunction, unspecified: Secondary | ICD-10-CM | POA: Diagnosis not present

## 2020-01-06 DIAGNOSIS — Z125 Encounter for screening for malignant neoplasm of prostate: Secondary | ICD-10-CM | POA: Diagnosis not present

## 2020-01-06 DIAGNOSIS — E1159 Type 2 diabetes mellitus with other circulatory complications: Secondary | ICD-10-CM | POA: Diagnosis not present

## 2020-01-06 DIAGNOSIS — E785 Hyperlipidemia, unspecified: Secondary | ICD-10-CM | POA: Diagnosis not present

## 2020-01-13 DIAGNOSIS — E785 Hyperlipidemia, unspecified: Secondary | ICD-10-CM | POA: Diagnosis not present

## 2020-01-13 DIAGNOSIS — R7309 Other abnormal glucose: Secondary | ICD-10-CM | POA: Diagnosis not present

## 2020-01-13 DIAGNOSIS — E1100 Type 2 diabetes mellitus with hyperosmolarity without nonketotic hyperglycemic-hyperosmolar coma (NKHHC): Secondary | ICD-10-CM | POA: Diagnosis not present

## 2020-01-13 DIAGNOSIS — I10 Essential (primary) hypertension: Secondary | ICD-10-CM | POA: Diagnosis not present

## 2020-04-11 DIAGNOSIS — Z79899 Other long term (current) drug therapy: Secondary | ICD-10-CM | POA: Diagnosis not present

## 2020-04-11 DIAGNOSIS — E1159 Type 2 diabetes mellitus with other circulatory complications: Secondary | ICD-10-CM | POA: Diagnosis not present

## 2020-04-11 DIAGNOSIS — E785 Hyperlipidemia, unspecified: Secondary | ICD-10-CM | POA: Diagnosis not present

## 2020-04-11 DIAGNOSIS — I10 Essential (primary) hypertension: Secondary | ICD-10-CM | POA: Diagnosis not present

## 2020-04-16 DIAGNOSIS — E785 Hyperlipidemia, unspecified: Secondary | ICD-10-CM | POA: Diagnosis not present

## 2020-04-16 DIAGNOSIS — R7309 Other abnormal glucose: Secondary | ICD-10-CM | POA: Diagnosis not present

## 2020-04-16 DIAGNOSIS — M5412 Radiculopathy, cervical region: Secondary | ICD-10-CM | POA: Diagnosis not present

## 2020-04-16 DIAGNOSIS — E1122 Type 2 diabetes mellitus with diabetic chronic kidney disease: Secondary | ICD-10-CM | POA: Diagnosis not present

## 2020-05-01 ENCOUNTER — Other Ambulatory Visit (HOSPITAL_COMMUNITY): Payer: Self-pay | Admitting: Internal Medicine

## 2020-05-01 DIAGNOSIS — M5412 Radiculopathy, cervical region: Secondary | ICD-10-CM | POA: Diagnosis not present

## 2020-05-01 DIAGNOSIS — M25512 Pain in left shoulder: Secondary | ICD-10-CM

## 2020-10-26 DIAGNOSIS — M5412 Radiculopathy, cervical region: Secondary | ICD-10-CM | POA: Diagnosis not present

## 2020-11-06 DIAGNOSIS — E1159 Type 2 diabetes mellitus with other circulatory complications: Secondary | ICD-10-CM | POA: Diagnosis not present

## 2020-11-09 ENCOUNTER — Other Ambulatory Visit (HOSPITAL_COMMUNITY): Payer: Self-pay | Admitting: Internal Medicine

## 2020-11-09 DIAGNOSIS — I10 Essential (primary) hypertension: Secondary | ICD-10-CM | POA: Diagnosis not present

## 2020-11-09 DIAGNOSIS — E1100 Type 2 diabetes mellitus with hyperosmolarity without nonketotic hyperglycemic-hyperosmolar coma (NKHHC): Secondary | ICD-10-CM | POA: Diagnosis not present

## 2020-11-09 DIAGNOSIS — R7309 Other abnormal glucose: Secondary | ICD-10-CM | POA: Diagnosis not present

## 2020-11-09 DIAGNOSIS — F17210 Nicotine dependence, cigarettes, uncomplicated: Secondary | ICD-10-CM

## 2020-12-04 ENCOUNTER — Ambulatory Visit (HOSPITAL_COMMUNITY)
Admission: RE | Admit: 2020-12-04 | Discharge: 2020-12-04 | Disposition: A | Discharge status: Home / Self Care | Payer: BC Managed Care – PPO | Source: Ambulatory Visit | Attending: Internal Medicine | Admitting: Internal Medicine

## 2020-12-04 ENCOUNTER — Other Ambulatory Visit: Payer: Self-pay

## 2020-12-04 DIAGNOSIS — F17210 Nicotine dependence, cigarettes, uncomplicated: Secondary | ICD-10-CM | POA: Diagnosis not present

## 2020-12-24 DIAGNOSIS — M7532 Calcific tendinitis of left shoulder: Secondary | ICD-10-CM | POA: Diagnosis not present

## 2021-02-08 DIAGNOSIS — M25511 Pain in right shoulder: Secondary | ICD-10-CM | POA: Diagnosis not present

## 2021-05-20 DIAGNOSIS — E1100 Type 2 diabetes mellitus with hyperosmolarity without nonketotic hyperglycemic-hyperosmolar coma (NKHHC): Secondary | ICD-10-CM | POA: Diagnosis not present

## 2021-05-20 DIAGNOSIS — I10 Essential (primary) hypertension: Secondary | ICD-10-CM | POA: Diagnosis not present

## 2021-05-20 DIAGNOSIS — J111 Influenza due to unidentified influenza virus with other respiratory manifestations: Secondary | ICD-10-CM | POA: Diagnosis not present

## 2021-05-20 DIAGNOSIS — M25511 Pain in right shoulder: Secondary | ICD-10-CM | POA: Diagnosis not present

## 2021-05-28 ENCOUNTER — Ambulatory Visit: Payer: BC Managed Care – PPO

## 2021-05-28 ENCOUNTER — Encounter: Payer: Self-pay | Admitting: Orthopaedic Surgery

## 2021-05-28 ENCOUNTER — Ambulatory Visit: Payer: BC Managed Care – PPO | Admitting: Orthopaedic Surgery

## 2021-05-28 ENCOUNTER — Other Ambulatory Visit: Payer: Self-pay

## 2021-05-28 VITALS — BP 166/83 | HR 49 | Ht 71.0 in | Wt 220.2 lb

## 2021-05-28 DIAGNOSIS — M7531 Calcific tendinitis of right shoulder: Secondary | ICD-10-CM | POA: Diagnosis not present

## 2021-05-28 DIAGNOSIS — M25511 Pain in right shoulder: Secondary | ICD-10-CM

## 2021-05-28 DIAGNOSIS — G8929 Other chronic pain: Secondary | ICD-10-CM

## 2021-05-28 DIAGNOSIS — M7530 Calcific tendinitis of unspecified shoulder: Secondary | ICD-10-CM

## 2021-05-28 MED ORDER — HYDROCODONE-ACETAMINOPHEN 5-325 MG PO TABS: ORAL_TABLET | ORAL | 0 refills | Status: DC

## 2021-05-28 NOTE — Progress Notes (Signed)
Subjective:    Patient ID: Trevor Steele, male    DOB: Aug 21, 1960, 61 y.o.   MRN: [id number]  HPI He has had right shoulder pain since he got his COVID booster on February 13, 2021.  He has had worsening pain and decreasing motion.  He has tried ice, rubs, heat, Tylenol and talked to Dr. Willey Steele about this.  He has no trauma.  He has no swelling, no redness, no numbness.  He is tired of hurting most of the time.  I have read Dr. Ria Steele notes.   Review of Systems  Constitutional:  Positive for activity change.  Musculoskeletal:  Positive for arthralgias and myalgias.  All other systems reviewed and are negative. For Review of Systems, all other systems reviewed and are negative.  The following is a summary of the past history medically, past history surgically, known current medicines, social history and family history.  This information is gathered electronically by the computer from prior information and documentation.  I review this each visit and have found including this information at this point in the chart is beneficial and informative.   Past Medical History:  Diagnosis Date   Diabetes mellitus without complication (Trevor Steele)     Past Surgical History:  Procedure Laterality Date   BACK SURGERY      Current Outpatient Medications on File Prior to Visit  Medication Sig Dispense Refill   atorvastatin (LIPITOR) 10 MG tablet Take 10 mg by mouth daily.  12   esomeprazole (NEXIUM) 40 MG capsule Take 40 mg by mouth daily.  12   lisinopril (PRINIVIL,ZESTRIL) 20 MG tablet Take 20 mg by mouth at bedtime.   1   metFORMIN (GLUCOPHAGE) 1000 MG tablet Take 1,000 mg by mouth 2 (two) times daily.  12   pioglitazone (ACTOS) 15 MG tablet Take 15 mg by mouth at bedtime.   1   No current facility-administered medications on file prior to visit.    Social History   Socioeconomic History   Marital status: Married    Spouse name: Not on file   Number of children: Not on file   Years of  education: Not on file   Highest education level: Not on file  Occupational History   Not on file  Tobacco Use   Smoking status: Every Day   Smokeless tobacco: Not on file  Substance and Sexual Activity   Alcohol use: No   Drug use: No   Sexual activity: Not on file  Other Topics Concern   Not on file  Social History Narrative   Not on file   Social Determinants of Health   Financial Resource Strain: Not on file  Food Insecurity: Not on file  Transportation Needs: Not on file  Physical Activity: Not on file  Stress: Not on file  Social Connections: Not on file  Intimate Partner Violence: Not on file    No family history on file.  BP (!) 166/83    Pulse (!) 49    Ht 5\' 11"  (1.803 m)    Wt 220 lb 3.2 oz (99.9 kg)    BMI 30.71 kg/m   Body mass index is 30.71 kg/m.     Objective:   Physical Exam Vitals and nursing note reviewed. Exam conducted with a chaperone present.  Constitutional:      Appearance: He is well-developed.  HENT:     Head: Normocephalic and atraumatic.  Eyes:     Conjunctiva/sclera: Conjunctivae normal.     Pupils: Pupils  are equal, round, and reactive to light.  Cardiovascular:     Rate and Rhythm: Normal rate and regular rhythm.  Pulmonary:     Effort: Pulmonary effort is normal.  Abdominal:     Palpations: Abdomen is soft.  Musculoskeletal:       Arms:     Cervical back: Normal range of motion and neck supple.  Skin:    General: Skin is warm and dry.  Neurological:     Mental Status: He is alert and oriented to person, place, and time.     Cranial Nerves: No cranial nerve deficit.     Motor: No abnormal muscle tone.     Coordination: Coordination normal.     Deep Tendon Reflexes: Reflexes are normal and symmetric. Reflexes normal.  Psychiatric:        Behavior: Behavior normal.        Thought Content: Thought content normal.        Judgment: Judgment normal.  X-rays were done of the right shoulder, reported separately.         Assessment & Plan:   Encounter Diagnoses  Name Primary?   Chronic right shoulder pain Yes   Calcific bursitis of shoulder    PROCEDURE NOTE:  The patient request injection, verbal consent was obtained.  The right shoulder was prepped appropriately after time out was performed.   Sterile technique was observed and injection of 1 cc of DepoMedrol 40mg  with several cc's of plain xylocaine. Anesthesia was provided by ethyl chloride and a 20-gauge needle was used to inject the shoulder area. A posterior approach was used.  The injection was tolerated well.  A band aid dressing was applied.  The patient was advised to apply ice later today and tomorrow to the injection sight as needed.  I will call in pain medicine.  I have reviewed the Trevor Steele web site prior to prescribing narcotic medicine for this patient.  Take two Aleve bid pc.  Return in two weeks.  Call if any problem.  Precautions discussed.  Electronically Signed Trevor Kava, MD 1/24/20239:20 AM

## 2021-06-11 ENCOUNTER — Ambulatory Visit: Payer: BC Managed Care – PPO | Admitting: Orthopaedic Surgery

## 2021-06-20 ENCOUNTER — Other Ambulatory Visit: Payer: Self-pay

## 2021-06-20 ENCOUNTER — Encounter: Payer: Self-pay | Admitting: Orthopaedic Surgery

## 2021-06-20 ENCOUNTER — Ambulatory Visit (INDEPENDENT_AMBULATORY_CARE_PROVIDER_SITE_OTHER): Payer: BC Managed Care – PPO | Admitting: Orthopaedic Surgery

## 2021-06-20 VITALS — BP 167/82 | HR 52 | Ht 71.5 in | Wt 220.0 lb

## 2021-06-20 DIAGNOSIS — M7531 Calcific tendinitis of right shoulder: Secondary | ICD-10-CM

## 2021-06-20 DIAGNOSIS — M25511 Pain in right shoulder: Secondary | ICD-10-CM

## 2021-06-20 DIAGNOSIS — M7530 Calcific tendinitis of unspecified shoulder: Secondary | ICD-10-CM

## 2021-06-20 DIAGNOSIS — G8929 Other chronic pain: Secondary | ICD-10-CM

## 2021-06-20 MED ORDER — HYDROCODONE-ACETAMINOPHEN 5-325 MG PO TABS: ORAL_TABLET | ORAL | 0 refills | Status: DC

## 2021-06-20 NOTE — Progress Notes (Signed)
I have less pain.  His right shoulder has less pain after the injection last time.  He still hurts but feels better.  He is doing his exercises and taking the Aleve and pain medicine.  He has no new trauma, no weakness or numbness.  Right shoulder has much better ROM and less pain, no effusion, NV intact.  Encounter Diagnoses  Name Primary?   Chronic right shoulder pain Yes   Calcific bursitis of shoulder    I will refill his medicine for pain.  I have reviewed the [place] web site prior to prescribing narcotic medicine for this patient.  Return in two weeks.  Continue exercises and Aleve.  Call if any problem.  Precautions discussed.  Electronically Signed Sanjuana Kava, MD 2/16/20238:50 AM

## 2021-06-21 DIAGNOSIS — E1159 Type 2 diabetes mellitus with other circulatory complications: Secondary | ICD-10-CM | POA: Diagnosis not present

## 2021-06-28 ENCOUNTER — Other Ambulatory Visit: Payer: Self-pay

## 2021-06-28 ENCOUNTER — Encounter (HOSPITAL_COMMUNITY): Payer: Self-pay

## 2021-06-28 ENCOUNTER — Emergency Department (HOSPITAL_COMMUNITY): Payer: BC Managed Care – PPO

## 2021-06-28 ENCOUNTER — Emergency Department (HOSPITAL_COMMUNITY)
Admission: EM | Admit: 2021-06-28 | Discharge: 2021-06-28 | Disposition: A | Discharge status: Home / Self Care | Payer: BC Managed Care – PPO | Attending: Emergency Medicine | Admitting: Emergency Medicine

## 2021-06-28 DIAGNOSIS — Z7982 Long term (current) use of aspirin: Secondary | ICD-10-CM | POA: Diagnosis not present

## 2021-06-28 DIAGNOSIS — Z7901 Long term (current) use of anticoagulants: Secondary | ICD-10-CM | POA: Insufficient documentation

## 2021-06-28 DIAGNOSIS — I480 Paroxysmal atrial fibrillation: Secondary | ICD-10-CM | POA: Diagnosis not present

## 2021-06-28 DIAGNOSIS — I4891 Unspecified atrial fibrillation: Secondary | ICD-10-CM | POA: Diagnosis not present

## 2021-06-28 DIAGNOSIS — R002 Palpitations: Secondary | ICD-10-CM | POA: Diagnosis not present

## 2021-06-28 DIAGNOSIS — R7309 Other abnormal glucose: Secondary | ICD-10-CM | POA: Diagnosis not present

## 2021-06-28 DIAGNOSIS — U071 COVID-19: Secondary | ICD-10-CM | POA: Diagnosis not present

## 2021-06-28 DIAGNOSIS — E785 Hyperlipidemia, unspecified: Secondary | ICD-10-CM | POA: Diagnosis not present

## 2021-06-28 HISTORY — DX: Pure hypercholesterolemia, unspecified: E78.00

## 2021-06-28 HISTORY — DX: COVID-19: U07.1

## 2021-06-28 HISTORY — DX: Essential (primary) hypertension: I10

## 2021-06-28 LAB — COMPREHENSIVE METABOLIC PANEL
ALT: 21 U/L (ref 0–44)
AST: 23 U/L (ref 15–41)
Albumin: 3.9 g/dL (ref 3.5–5.0)
Alkaline Phosphatase: 77 U/L (ref 38–126)
Anion gap: 9 (ref 5–15)
BUN: 15 mg/dL (ref 8–23)
CO2: 23 mmol/L (ref 22–32)
Calcium: 9.1 mg/dL (ref 8.9–10.3)
Chloride: 103 mmol/L (ref 98–111)
Creatinine, Ser: 0.97 mg/dL (ref 0.61–1.24)
GFR, Estimated: >60 mL/min (ref >60)
Glucose, Bld: 147 mg/dL — ABNORMAL HIGH (ref 70–99)
Potassium: 4.3 mmol/L (ref 3.5–5.1)
Sodium: 135 mmol/L (ref 135–145)
Total Bilirubin: 0.4 mg/dL (ref 0.3–1.2)
Total Protein: 7.5 g/dL (ref 6.5–8.1)

## 2021-06-28 LAB — CBC WITH DIFFERENTIAL/PLATELET
Abs Immature Granulocytes: 0.02 10*3/uL (ref 0.00–0.07)
Basophils Absolute: 0 10*3/uL (ref 0.0–0.1)
Basophils Relative: 1 %
Eosinophils Absolute: 0.4 10*3/uL (ref 0.0–0.5)
Eosinophils Relative: 5 %
HCT: 43.6 % (ref 39.0–52.0)
Hemoglobin: 15 g/dL (ref 13.0–17.0)
Immature Granulocytes: 0 %
Lymphocytes Relative: 31 %
Lymphs Abs: 2.6 10*3/uL (ref 0.7–4.0)
MCH: 31.8 pg (ref 26.0–34.0)
MCHC: 34.4 g/dL (ref 30.0–36.0)
MCV: 92.6 fL (ref 80.0–100.0)
Monocytes Absolute: 0.9 10*3/uL (ref 0.1–1.0)
Monocytes Relative: 11 %
Neutro Abs: 4.4 10*3/uL (ref 1.7–7.7)
Neutrophils Relative %: 52 %
Platelets: 240 10*3/uL (ref 150–400)
RBC: 4.71 MIL/uL (ref 4.22–5.81)
RDW: 13.2 % (ref 11.5–15.5)
WBC: 8.3 10*3/uL (ref 4.0–10.5)
nRBC: 0 % (ref 0.0–0.2)

## 2021-06-28 LAB — RESP PANEL BY RT-PCR (FLU A&B, COVID) ARPGX2
Influenza A by PCR: NEGATIVE
Influenza B by PCR: NEGATIVE
SARS Coronavirus 2 by RT PCR: POSITIVE — AB

## 2021-06-28 LAB — TROPONIN I (HIGH SENSITIVITY)
Troponin I (High Sensitivity): 6 ng/L (ref <18)
Troponin I (High Sensitivity): 6 ng/L (ref <18)

## 2021-06-28 MED ORDER — APIXABAN 5 MG PO TABS
5.0000 mg | ORAL_TABLET | Freq: Two times a day (BID) | ORAL | Status: DC
  Administered 2021-06-28: 5 mg via ORAL
  Filled 2021-06-28: qty 1

## 2021-06-28 MED ORDER — DILTIAZEM LOAD VIA INFUSION
10.0000 mg | Freq: Once | INTRAVENOUS | Status: AC
  Administered 2021-06-28: 10 mg via INTRAVENOUS
  Filled 2021-06-28: qty 10

## 2021-06-28 MED ORDER — SODIUM CHLORIDE 0.9 % IV BOLUS
500.0000 mL | Freq: Once | INTRAVENOUS | Status: AC
  Administered 2021-06-28: 500 mL via INTRAVENOUS

## 2021-06-28 MED ORDER — SODIUM CHLORIDE 0.9 % IV SOLN: INTRAVENOUS | Status: DC

## 2021-06-28 MED ORDER — APIXABAN 5 MG PO TABS: 5.0000 mg | ORAL_TABLET | Freq: Two times a day (BID) | ORAL | 0 refills | Status: DC

## 2021-06-28 MED ORDER — DILTIAZEM HCL-DEXTROSE 125-5 MG/125ML-% IV SOLN (PREMIX)
5.0000 mg/h | INTRAVENOUS | Status: DC
  Administered 2021-06-28: 5 mg/h via INTRAVENOUS
  Filled 2021-06-28: qty 125

## 2021-06-28 MED ORDER — METOPROLOL TARTRATE 5 MG/5ML IV SOLN
5.0000 mg | Freq: Once | INTRAVENOUS | Status: AC
  Administered 2021-06-28: 5 mg via INTRAVENOUS
  Filled 2021-06-28: qty 5

## 2021-06-28 MED ORDER — METOPROLOL SUCCINATE ER 50 MG PO TB24: 50.0000 mg | ORAL_TABLET | Freq: Every day | ORAL | 1 refills | Status: DC

## 2021-06-28 NOTE — ED Triage Notes (Signed)
Pt to er, pt states that he was at his doctor earlier today and they found that he was in a fib with a rate of 137, states that he doesn't have any chest pain or sob, pt states that he is feeling well.  States that he is here because they told him to come to the er.

## 2021-06-28 NOTE — Discharge Instructions (Addendum)

## 2021-06-28 NOTE — ED Notes (Signed)
Patient Alert and oriented to baseline. Stable and ambulatory to baseline. Patient verbalized understanding of the discharge instructions.  Patient belongings were taken by the patient.   

## 2021-06-28 NOTE — ED Provider Notes (Signed)
°  Face-to-face evaluation   History: Patient went to see his doctor for a checkup on his diabetes and was found to be in rapid atrial fibrillation.  He was therefore sent here for evaluation.  Patient denies sensation of palpitations, shortness of breath, weakness or dizziness.  No prior history of atrial fibrillation.  Physical exam: Obese, elderly appearing, alert and cooperative.  No dysarthria or aphasia.  Heart irregular tachycardia.  Faint pulse left wrist.  He does not perfuse all of his beats.  MDM: Evaluation for  Chief Complaint  Patient presents with   Atrial Fibrillation     Patient presenting with incidental A-fib with RVR, found during a routine office evaluation today.  Has history of diabetes and hypertension.  He does not know of prior cardiac disease.  No prior cardiac echo.  Patient required ED resuscitation with rate control, supplementation of intravascular volume and consideration for hospitalization.   Medical screening examination/treatment/procedure(s) were conducted as a shared visit with non-physician practitioner(s) and myself.  I personally evaluated the patient during the encounter    Daleen Bo, MD 07/01/21 1325

## 2021-06-29 NOTE — ED Provider Notes (Signed)
[facility] EMERGENCY DEPARTMENT Provider Note   CSN: [id number] Arrival date & time: 06/28/21  1252     History  Chief Complaint  Patient presents with   Atrial Fibrillation    Trevor Steele is a 61 y.o. male.  Pt went to Dr. Ria Comment office today for physical and was noted to have an elevated heart rate.  Pt had an EKG and was diagnosed with rapid atrial fibrillation   The history is provided by the patient, the spouse and a caregiver. No language interpreter was used.  Atrial Fibrillation This is a new problem. The current episode started more than 1 week ago. The problem has not changed since onset.Associated symptoms include chest pain. Pertinent negatives include no shortness of breath. Nothing aggravates the symptoms. Nothing relieves the symptoms. He has tried nothing for the symptoms.      Home Medications Prior to Admission medications   Medication Sig Start Date End Date Taking? Authorizing Provider  apixaban (ELIQUIS) 5 MG TABS tablet Take 1 tablet (5 mg total) by mouth 2 (two) times daily. 06/28/21 07/28/21 Yes Fransico Meadow, PA-C  aspirin EC 81 MG tablet Take 81 mg by mouth daily. Swallow whole.   Yes [provider]  atorvastatin (LIPITOR) 20 MG tablet Take 10 mg by mouth daily. 10/10/15  Yes [provider]  esomeprazole (NEXIUM) 40 MG capsule Take 40 mg by mouth daily. 10/10/15  Yes [provider]  glimepiride (AMARYL) 1 MG tablet Take 1 mg by mouth every evening.   Yes [provider]  HYDROcodone-acetaminophen (NORCO/VICODIN) 5-325 MG tablet One tablet every six hours for pain.  Limit 7 days. Patient taking differently: Take 1 tablet by mouth daily. One tablet every six hours for pain.  Limit 7 days. 06/20/21  Yes Sanjuana Kava, MD  lisinopril (PRINIVIL,ZESTRIL) 20 MG tablet Take 20 mg by mouth daily. 09/28/15  Yes [provider]  metFORMIN (GLUCOPHAGE) 1000 MG tablet Take 1,000 mg by mouth 2 (two) times daily. 10/10/15   Yes [provider]  metoprolol succinate (TOPROL XL) 50 MG 24 hr tablet Take 1 tablet (50 mg total) by mouth daily. Take with or immediately following a meal. 06/28/21 06/28/22 Yes Fransico Meadow, PA-C  Multiple Vitamin (MULTIVITAMIN WITH MINERALS) TABS tablet Take 1 tablet by mouth daily.   Yes [provider]  pioglitazone (ACTOS) 15 MG tablet Take 15 mg by mouth at bedtime.  09/21/15  Yes [provider]      Allergies    Patient has no known allergies.    Review of Systems   Review of Systems  Respiratory:  Negative for shortness of breath.   Cardiovascular:  Positive for chest pain.  All other systems reviewed and are negative.  Physical Exam Updated Vital Signs BP 129/78    Pulse 75    Temp 98.4 F (36.9 C) (Oral)    Resp 16    Ht 5' 11.5" (1.816 m)    Wt 93.4 kg    SpO2 98%    BMI 28.33 kg/m  Physical Exam Vitals reviewed.  Constitutional:      Appearance: Normal appearance.  HENT:     Head: Normocephalic.     Mouth/Throat:     Mouth: Mucous membranes are moist.  Eyes:     Extraocular Movements: Extraocular movements intact.     Pupils: Pupils are equal, round, and reactive to light.  Cardiovascular:     Rate and Rhythm: Tachycardia present.  Pulses: Normal pulses.  Pulmonary:     Effort: Pulmonary effort is normal.  Abdominal:     General: Abdomen is flat.  Musculoskeletal:        General: Normal range of motion.  Skin:    General: Skin is warm.  Neurological:     General: No focal deficit present.     Mental Status: He is alert.  Psychiatric:        Mood and Affect: Mood normal.    ED Results / Procedures / Treatments   Labs (all labs ordered are listed, but only abnormal results are displayed) Labs Reviewed  RESP PANEL BY RT-PCR (FLU A&B, COVID) ARPGX2 - Abnormal; Notable for the following components:      Result Value   SARS Coronavirus 2 by RT PCR POSITIVE (*)    All other components within normal limits  COMPREHENSIVE  METABOLIC PANEL - Abnormal; Notable for the following components:   Glucose, Bld 147 (*)    All other components within normal limits  CBC WITH DIFFERENTIAL/PLATELET  TROPONIN I (HIGH SENSITIVITY)  TROPONIN I (HIGH SENSITIVITY)    EKG EKG Interpretation  Date/Time:  Friday June 28 2021 14:35:12 EST Ventricular Rate:  96 PR Interval:  171 QRS Duration: 81 QT Interval:  329 QTC Calculation: 416 R Axis:   38 Text Interpretation: Sinus rhythm LAE, consider biatrial enlargement Abnormal R-wave progression, early transition Baseline wander in lead(s) V1 Since last tracing rate slower and Atrial fibrillation resolved Confirmed by Daleen Bo [id number]) on 06/28/2021 2:45:10 PM  Radiology DG Chest Port 1 View  Result Date: 06/28/2021 CLINICAL DATA:  Palpitations EXAM: PORTABLE CHEST 1 VIEW COMPARISON:  10/18/2015, 12/04/2020 FINDINGS: The heart size and mediastinal contours are within normal limits. Mildly coarsened interstitial markings bilaterally. No focal airspace consolidation, pleural effusion, or pneumothorax. The visualized skeletal structures are unremarkable. IMPRESSION: Mildly coarsened interstitial markings bilaterally, which may reflect bronchitic type lung changes. No focal airspace consolidation. Electronically Signed   By: Davina Poke D.O.   On: 06/28/2021 13:25    Procedures .Critical Care Performed by: Fransico Meadow, PA-C Authorized by: Fransico Meadow, PA-C   Critical care provider statement:    Critical care time (minutes):  30   Critical care start time:  06/28/2021 1:00 PM   Critical care end time:  06/28/2021 3:00 PM   Critical care was necessary to treat or prevent imminent or life-threatening deterioration of the following conditions:  Cardiac failure and circulatory failure   Critical care was time spent personally by me on the following activities:  Interpretation of cardiac output measurements, examination of patient, evaluation of patient's response to  treatment, discussions with consultants, discussions with primary provider, review of old charts, re-evaluation of patient's condition, pulse oximetry, ordering and review of radiographic studies and ordering and review of laboratory studies    Medications Ordered in ED Medications  sodium chloride 0.9 % bolus 500 mL (0 mLs Intravenous Stopped 06/28/21 1356)  diltiazem (CARDIZEM) 1 mg/mL load via infusion 10 mg (10 mg Intravenous Bolus from Bag 06/28/21 1358)  metoprolol tartrate (LOPRESSOR) injection 5 mg (5 mg Intravenous Given 06/28/21 1637)    ED Course/ Medical Decision Making/ A&P                           Medical Decision Making Patient found to be in rapid atrial for up at Dr. Ria Comment office  Amount and/or Complexity of Data Reviewed Independent Historian: spouse Labs:  ordered. Radiology: ordered. ECG/medicine tests: ordered and independent interpretation performed. Decision-making details documented in ED Course.    Details: Afib with RVRR on initail EKG.  repeat EKg after Cardizem shows bigeminy Discussion of management or test interpretation with external provider(s): Dr. Willey Blade called and spoke with Dr. Eulis Foster before pt's arrival.  I spoke to Dr. Domenic Polite on call for Cardiology who advised metoprolol and start pt on eliquis.  Patient was given a dosage of Eliquis here in the emergency department he was started on metoprolol here.  Resume drip was stopped and patient remained in sinus rhythm.  Bigeminy decreased with discontinuation of Cardizem patient continued to have occasional PVCs.  Is advised to call and schedule to see Dr. Domenic Polite for further evaluation he is to return to the emergency department if any problems  Risk Prescription drug management. Decision regarding hospitalization.           Final Clinical Impression(s) / ED Diagnoses Final diagnoses:  Atrial fibrillation, unspecified type ([place])  COVID    Rx / DC Orders ED Discharge Orders          Ordered     apixaban (ELIQUIS) 5 MG TABS tablet  2 times daily        06/28/21 1737    metoprolol succinate (TOPROL XL) 50 MG 24 hr tablet  Daily        06/28/21 1737           An After Visit Summary was printed and given to the patient.    Fransico Meadow, Hershal Coria 06/29/21 2241    Daleen Bo, MD 07/01/21 1326

## 2021-07-04 ENCOUNTER — Ambulatory Visit: Payer: BC Managed Care – PPO | Admitting: Orthopaedic Surgery

## 2021-07-09 ENCOUNTER — Encounter: Payer: Self-pay | Admitting: Orthopaedic Surgery

## 2021-07-09 ENCOUNTER — Ambulatory Visit (INDEPENDENT_AMBULATORY_CARE_PROVIDER_SITE_OTHER): Payer: BC Managed Care – PPO | Admitting: Orthopaedic Surgery

## 2021-07-09 ENCOUNTER — Other Ambulatory Visit: Payer: Self-pay

## 2021-07-09 VITALS — BP 153/76 | HR 82 | Ht 71.5 in | Wt 212.8 lb

## 2021-07-09 DIAGNOSIS — G8929 Other chronic pain: Secondary | ICD-10-CM

## 2021-07-09 DIAGNOSIS — M25511 Pain in right shoulder: Secondary | ICD-10-CM | POA: Diagnosis not present

## 2021-07-09 DIAGNOSIS — Z7901 Long term (current) use of anticoagulants: Secondary | ICD-10-CM

## 2021-07-09 MED ORDER — HYDROCODONE-ACETAMINOPHEN 5-325 MG PO TABS: ORAL_TABLET | ORAL | 0 refills | Status: DC

## 2021-07-09 NOTE — Addendum Note (Signed)
Addended by: Willette Pa on: 07/09/2021 10:23 AM ? ? Modules accepted: Orders ? ?

## 2021-07-09 NOTE — Progress Notes (Signed)
I have developed atrial fib. ? ?He has seen Dr. Willey Blade and has atrial fib.  He is anticoagulated.  He has appointment to see cardiologist on 3-21.  I cannot give NSAIDs.   ? ?He has right shoulder pain that is getting a little worse.  He prefers not to have any injection until after he sees cardiologist.  He has pain with overhead use. ? ?Examination of right Upper Extremity is done. ? Inspection: ?  Overall:  Elbow non-tender without crepitus or defects, forearm non-tender without crepitus or defects, wrist non-tender without crepitus or defects, hand non-tender.  ?  Shoulder: with glenohumeral joint tenderness, without effusion. ?  Upper arm:  without swelling and tenderness ? ? Range of motion: ?  Overall:  Full range of motion of the elbow, full range of motion of wrist and full range of motion in fingers. ?  Shoulder:  right  125 degrees forward flexion; 100 degrees abduction; 20 degrees internal rotation, 20 degrees external rotation, 5 degrees extension, 40 degrees adduction. ? ? Stability: ?  Overall:  Shoulder, elbow and wrist stable ? ? Strength and Tone: ?  Overall full shoulder muscles strength, full upper arm strength and normal upper arm bulk and tone.  ? ?Encounter Diagnoses  ?Name Primary?  ? Chronic right shoulder pain Yes  ? Anticoagulated   ? ?Return in three weeks. ? ?Re-evaluate then. ? ?I have explained exercises to do. ? ?I have reviewed the [place] web site prior to prescribing narcotic medicine for this patient. ? ?Call if any problem. ? ?Precautions discussed. ? ?Electronically Signed ?Sanjuana Kava, MD ?3/7/20239:18 AM ? ?

## 2021-07-12 DIAGNOSIS — K047 Periapical abscess without sinus: Secondary | ICD-10-CM | POA: Diagnosis not present

## 2021-07-12 DIAGNOSIS — I480 Paroxysmal atrial fibrillation: Secondary | ICD-10-CM | POA: Diagnosis not present

## 2021-07-13 ENCOUNTER — Other Ambulatory Visit: Payer: Self-pay

## 2021-07-13 ENCOUNTER — Emergency Department (HOSPITAL_COMMUNITY)
Admission: EM | Admit: 2021-07-13 | Discharge: 2021-07-13 | Disposition: A | Discharge status: Home / Self Care | Payer: BC Managed Care – PPO | Attending: Emergency Medicine | Admitting: Emergency Medicine

## 2021-07-13 ENCOUNTER — Encounter (HOSPITAL_COMMUNITY): Payer: Self-pay | Admitting: Emergency Medicine

## 2021-07-13 DIAGNOSIS — Z79899 Other long term (current) drug therapy: Secondary | ICD-10-CM | POA: Insufficient documentation

## 2021-07-13 DIAGNOSIS — Z7901 Long term (current) use of anticoagulants: Secondary | ICD-10-CM | POA: Insufficient documentation

## 2021-07-13 DIAGNOSIS — I4891 Unspecified atrial fibrillation: Secondary | ICD-10-CM | POA: Insufficient documentation

## 2021-07-13 DIAGNOSIS — Z7982 Long term (current) use of aspirin: Secondary | ICD-10-CM | POA: Diagnosis not present

## 2021-07-13 HISTORY — DX: Unspecified atrial fibrillation: I48.91

## 2021-07-13 LAB — CBC
HCT: 44.9 % (ref 39.0–52.0)
Hemoglobin: 15.1 g/dL (ref 13.0–17.0)
MCH: 31.3 pg (ref 26.0–34.0)
MCHC: 33.6 g/dL (ref 30.0–36.0)
MCV: 93 fL (ref 80.0–100.0)
Platelets: 247 10*3/uL (ref 150–400)
RBC: 4.83 MIL/uL (ref 4.22–5.81)
RDW: 13.2 % (ref 11.5–15.5)
WBC: 10 10*3/uL (ref 4.0–10.5)
nRBC: 0 % (ref 0.0–0.2)

## 2021-07-13 LAB — BASIC METABOLIC PANEL
Anion gap: 10 (ref 5–15)
BUN: 21 mg/dL (ref 8–23)
CO2: 22 mmol/L (ref 22–32)
Calcium: 9.3 mg/dL (ref 8.9–10.3)
Chloride: 104 mmol/L (ref 98–111)
Creatinine, Ser: 1.05 mg/dL (ref 0.61–1.24)
GFR, Estimated: >60 mL/min (ref >60)
Glucose, Bld: 175 mg/dL — ABNORMAL HIGH (ref 70–99)
Potassium: 3.8 mmol/L (ref 3.5–5.1)
Sodium: 136 mmol/L (ref 135–145)

## 2021-07-13 LAB — MAGNESIUM: Magnesium: 1.7 mg/dL (ref 1.7–2.4)

## 2021-07-13 MED ORDER — DILTIAZEM HCL 25 MG/5ML IV SOLN
20.0000 mg | Freq: Once | INTRAVENOUS | Status: AC
  Administered 2021-07-13: 20 mg via INTRAVENOUS
  Filled 2021-07-13: qty 5

## 2021-07-13 NOTE — ED Triage Notes (Signed)
Patient recently diagnosed with afib x15 days ago. Patient placed on Eliquis and Metoprolol. Per patient was told to come back to ED if resting HR went over 110. Per patient was checking Hr and HR kept fluctuating into the 140s. Patient denies any symptoms. Patient states "I never had any symptoms the first time. I was over at my doctors getting my A1C and when he listened to my heart he told me he thought I was in Afib and sent me here." Per patient HR was in 180s and was placed on a cardizem drip while here in ED. Patient also states that HR dropped to 45 when he went home on metoprolol and they decreased it from "whole pill to half." Patient states "It was so high that I went ahead and took a whole one." ?

## 2021-07-13 NOTE — ED Provider Notes (Addendum)
?[facility] ?Provider Note ? ? ?CSN: [id number] ?Arrival date & time: 07/13/21  1718 ? ?  ? ?History ? ?Chief Complaint  ?Patient presents with  ? Atrial Fibrillation  ? ? ?Trevor Steele is a 61 y.o. male. ? ? ?Atrial Fibrillation ?Pertinent negatives include no chest pain and no shortness of breath.  ? ? Pt has history of a fib.  Felt like he went back into it today.  Noticed that his heart rate is elevated.  Pt is on eliquis and metoprolol.  He denies any chest pain.  No fevers or chills.  He is not having any dizziness.  Patient states he does not really notice that he goes back into A-fib and that is why he has been monitoring it on his watch. ? ?Patient has been taking his medications as directed.  He is supposed to follow-up with cardiology but has not seen them yet ? ?Home Medications ?Prior to Admission medications   ?Medication Sig Start Date End Date Taking? Authorizing Provider  ?apixaban (ELIQUIS) 5 MG TABS tablet Take 1 tablet (5 mg total) by mouth 2 (two) times daily. 06/28/21 07/28/21  Fransico Meadow, PA-C  ?aspirin EC 81 MG tablet Take 81 mg by mouth daily. Swallow whole.    [provider]  ?atorvastatin (LIPITOR) 20 MG tablet Take 10 mg by mouth daily. 10/10/15   [provider]  ?esomeprazole (NEXIUM) 40 MG capsule Take 40 mg by mouth daily. 10/10/15   [provider]  ?glimepiride (AMARYL) 1 MG tablet Take 1 mg by mouth every evening.    [provider]  ?HYDROcodone-acetaminophen (NORCO/VICODIN) 5-325 MG tablet One tablet every six hours for pain.  Limit 7 days. 07/09/21   Sanjuana Kava, MD  ?lisinopril (PRINIVIL,ZESTRIL) 20 MG tablet Take 20 mg by mouth daily. 09/28/15   [provider]  ?metFORMIN (GLUCOPHAGE) 1000 MG tablet Take 1,000 mg by mouth 2 (two) times daily. 10/10/15   [provider]  ?metoprolol succinate (TOPROL XL) 50 MG 24 hr tablet Take 1 tablet (50 mg total) by mouth daily. Take with or immediately following a  meal. 06/28/21 06/28/22  Fransico Meadow, PA-C  ?Multiple Vitamin (MULTIVITAMIN WITH MINERALS) TABS tablet Take 1 tablet by mouth daily.    [provider]  ?pioglitazone (ACTOS) 15 MG tablet Take 15 mg by mouth at bedtime.  09/21/15   [provider]  ?   ? ?Allergies    ?Patient has no known allergies.   ? ?Review of Systems   ?Review of Systems  ?Constitutional:  Negative for fever.  ?Respiratory:  Negative for shortness of breath.   ?Cardiovascular:  Negative for chest pain.  ? ?Physical Exam ?Updated Vital Signs ?BP 110/70   Pulse 79   Temp 97.8 ?F (36.6 ?C) (Oral)   Resp 13   Ht 1.829 m (6')   Wt 94 kg   SpO2 100%   BMI 28.12 kg/m?  ?Physical Exam ?Vitals and nursing note reviewed.  ?Constitutional:   ?   General: He is not in acute distress. ?   Appearance: He is well-developed.  ?HENT:  ?   Head: Normocephalic and atraumatic.  ?   Right Ear: External ear normal.  ?   Left Ear: External ear normal.  ?Eyes:  ?   General: No scleral icterus.    ?   Right eye: No discharge.     ?   Left eye: No discharge.  ?   Conjunctiva/sclera: Conjunctivae  normal.  ?Neck:  ?   Trachea: No tracheal deviation.  ?Cardiovascular:  ?   Rate and Rhythm: Tachycardia present. Rhythm irregular.  ?Pulmonary:  ?   Effort: Pulmonary effort is normal. No respiratory distress.  ?   Breath sounds: Normal breath sounds. No stridor. No wheezing or rales.  ?Abdominal:  ?   General: Bowel sounds are normal. There is no distension.  ?   Palpations: Abdomen is soft.  ?   Tenderness: There is no abdominal tenderness. There is no guarding or rebound.  ?Musculoskeletal:     ?   General: No tenderness or deformity.  ?   Cervical back: Neck supple.  ?Skin: ?   General: Skin is warm and dry.  ?   Findings: No rash.  ?Neurological:  ?   General: No focal deficit present.  ?   Mental Status: He is alert.  ?   Cranial Nerves: No cranial nerve deficit (no facial droop, extraocular movements intact, no slurred speech).  ?   Sensory: No  sensory deficit.  ?   Motor: No abnormal muscle tone or seizure activity.  ?   Coordination: Coordination normal.  ?Psychiatric:     ?   Mood and Affect: Mood normal.  ? ? ?ED Results / Procedures / Treatments   ?Labs ?(all labs ordered are listed, but only abnormal results are displayed) ?Labs Reviewed  ?BASIC METABOLIC PANEL - Abnormal; Notable for the following components:  ?    Result Value  ? Glucose, Bld 175 (*)   ? All other components within normal limits  ?CBC  ?MAGNESIUM  ? ? ?EKG ?EKG Interpretation ? ?Date/Time:  Saturday July 13 2021 18:05:10 EST ?Ventricular Rate:  156 ?PR Interval:    ?QRS Duration: 78 ?QT Interval:  286 ?QTC Calculation: 460 ?R Axis:   58 ?Text Interpretation: Atrial fibrillation with rapid ventricular response Minimal voltage criteria for LVH, may be normal variant ( Sokolow-Lyon ) Nonspecific ST and T wave abnormality Abnormal ECG a fib?  is new Confirmed by Dorie Rank [phone]) on 07/13/2021 6:37:40 PM ? ?Radiology ?No results found. ? ?Procedures ?Procedures  ? ? ?Medications Ordered in ED ?Medications  ?diltiazem (CARDIZEM) injection 20 mg (20 mg Intravenous Given 07/13/21 1855)  ? ? ?ED Course/ Medical Decision Making/ A&P ?Clinical Course as of 07/13/21 2019  ?Sat Jul 13, 2021  ?1926 CBC ?Normal [JK]  ?2197 Basic metabolic panel(!) ?Normal [JK]  ?1926 Magnesium ?Normal [JK]  ?2016 Patient still remains in A-fib but his heart rates down into the 90s. [JK]  ?  ?Clinical Course User Index ?[JK] Dorie Rank, MD  ? ?                        ?Medical Decision Making ?Amount and/or Complexity of Data Reviewed ?Labs: ordered. Decision-making details documented in ED Course. ? ?Risk ?Prescription drug management. ? ? ?Patient has a history of atrial fibrillation.  He was recently diagnosed with this and is currently taking anticoagulants and metoprolol.  Patient had cut back on his metoprolol dosing but did take an extra 1 this evening.  Patient was noted to be tachycardic to 130s.  He was  given a dose of Cardizem and his heart rate has decreased.  Patient has been monitored in the ED and remained stable.  I discussed the options with the patient of proceeding with cardioversion as he is on anticoagulation.  Patient would prefer to continue on his home regimen for now  and follow-up with cardiology.  He understands to return to the ED as needed for worsening symptoms or persistent tachycardia ? ? ?As the nurse was getting ready to discharge the patient he noticed that the patient's heart rhythm was regular on the monitor.  He appeared to be back in normal sinus rhythm.  EKG was performed and I agree the patient is back in sinus rhythm with a rate of 78. ? ? ? ? ?Final Clinical Impression(s) / ED Diagnoses ?Final diagnoses:  ?Atrial fibrillation, unspecified type ([place])  ? ? ?Rx / DC Orders ?ED Discharge Orders   ? ? None  ? ?  ? ? ?  ?Dorie Rank, MD ?07/13/21 2020 ? ?  ?Dorie Rank, MD ?07/13/21 2028 ? ?

## 2021-07-13 NOTE — Discharge Instructions (Addendum)
Continue your metoprolol and your Eliquis.  Return to the ED if you start having any symptoms of lightheadedness dizziness or persistent tachycardia.  Follow-up with the cardiologist as planned ?

## 2021-07-16 ENCOUNTER — Encounter: Payer: Self-pay | Admitting: Internal Medicine

## 2021-07-16 ENCOUNTER — Other Ambulatory Visit: Payer: Self-pay

## 2021-07-16 ENCOUNTER — Ambulatory Visit (INDEPENDENT_AMBULATORY_CARE_PROVIDER_SITE_OTHER): Payer: BC Managed Care – PPO | Admitting: Internal Medicine

## 2021-07-16 VITALS — BP 130/66 | HR 80 | Ht 71.0 in | Wt 208.0 lb

## 2021-07-16 DIAGNOSIS — E785 Hyperlipidemia, unspecified: Secondary | ICD-10-CM

## 2021-07-16 DIAGNOSIS — E119 Type 2 diabetes mellitus without complications: Secondary | ICD-10-CM | POA: Diagnosis not present

## 2021-07-16 DIAGNOSIS — I700 Atherosclerosis of aorta: Secondary | ICD-10-CM

## 2021-07-16 DIAGNOSIS — I152 Hypertension secondary to endocrine disorders: Secondary | ICD-10-CM

## 2021-07-16 DIAGNOSIS — I2510 Atherosclerotic heart disease of native coronary artery without angina pectoris: Secondary | ICD-10-CM

## 2021-07-16 DIAGNOSIS — E1169 Type 2 diabetes mellitus with other specified complication: Secondary | ICD-10-CM | POA: Diagnosis not present

## 2021-07-16 DIAGNOSIS — I480 Paroxysmal atrial fibrillation: Secondary | ICD-10-CM | POA: Diagnosis not present

## 2021-07-16 DIAGNOSIS — E1159 Type 2 diabetes mellitus with other circulatory complications: Secondary | ICD-10-CM

## 2021-07-16 MED ORDER — EMPAGLIFLOZIN 10 MG PO TABS: 10.0000 mg | ORAL_TABLET | Freq: Every day | ORAL | 3 refills | Status: DC

## 2021-07-16 MED ORDER — ATORVASTATIN CALCIUM 40 MG PO TABS: 40.0000 mg | ORAL_TABLET | Freq: Every day | ORAL | 3 refills | Status: DC

## 2021-07-16 NOTE — Patient Instructions (Addendum)
Medication Instructions:  ?1) Increase Atorvastatin to 40 mg daily  ? ?2) Start Jardiance 10 mg daily  ? ?*If you need a refill on your cardiac medications before your next appointment, please call your pharmacy* ? ? ?Lab Work: ?None ordered  ? ?If you have labs (blood work) drawn today and your tests are completely normal, you will receive your results only by: ?MyChart Message (if you have MyChart) OR ?A paper copy in the mail ?If you have any lab test that is abnormal or we need to change your treatment, we will call you to review the results. ? ? ?Testing/Procedures: ?Your physician has requested that you have an echocardiogram. Echocardiography is a painless test that uses sound waves to create images of your heart. It provides your doctor with information about the size and shape of your heart and how well your heart?s chambers and valves are working. This procedure takes approximately one hour. There are no restrictions for this procedure. ? ? ? ?Follow-Up: ?At [facility], you and your health needs are our priority.  As part of our continuing mission to provide you with exceptional heart care, we have created designated Provider Care Teams.  These Care Teams include your primary Cardiologist (physician) and Advanced Practice Providers (APPs -  Physician Assistants and Nurse Practitioners) who all work together to provide you with the care you need, when you need it. ? ?We recommend signing up for the patient portal called "MyChart".  Sign up information is provided on this After Visit Summary.  MyChart is used to connect with patients for Virtual Visits (Telemedicine).  Patients are able to view lab/test results, encounter notes, upcoming appointments, etc.  Non-urgent messages can be sent to your provider as well.   ?To learn more about what you can do with MyChart, go to [email or web address].   ? ?Your next appointment:   ?2 month(s) ? ?The format for your next appointment:   ?In Person ? ?Provider:    ?You may see first available doctor at the [place] office or one of the following Advanced Practice Providers on your designated Care Team:   ?Bernerd Pho, PA-C  ?Ermalinda Barrios, PA-C { ? ? ?Other Instructions ?None   ?

## 2021-07-16 NOTE — Progress Notes (Signed)
?Cardiology Office Note:   ? ?Date:  07/16/2021  ? ?ID:  EZANA HUBBERT, DOB 1960-08-11, MRN [id number] ? ?PCP:  [name], MD  ? ?[facility] Providers ?Cardiologist:  Lenna Sciara, MD ?Referring MD: [name], MD  ? ?Chief Complaint/Reason for Referral: ER follow-up for atrial fibrillation ? ?ASSESSMENT:   ? ?PAF (paroxysmal atrial fibrillation) ([place]) ? ?Type 2 diabetes mellitus without complication, without long-term current use of insulin ([place]) ? ?Hypertension associated with diabetes ([place]) ? ?Hyperlipidemia associated with type 2 diabetes mellitus ([place]) ? ?Aortic atherosclerosis ([place]) ? ?Coronary artery calcification seen on CAT scan ? ?PLAN:   ? ?In order of problems listed above: ?1.  We will obtain echocardiogram.  Continue Eliquis and Toprol for now; in NSR today.  Patient would like to follow-up in [place]; will arrange follow-up in 2 months. ?2.  Continue Eliquis in lieu of aspirin, continue lisinopril, increase atorvastatin to 40 mg, and start Jardiance 10 mg daily. ?3.  BP controlled, continue lisinopril. ?4.  Plan on checking lipid panel and LFTs in the future; goal LDL is less than 70. ?5.  He is having no signs or symptoms of angina, is active and walks quite a bit every week.  Continue Eliquis in lieu of aspirin, increase atorvastatin to 40 mg, and strict blood pressure control.  Goal LDL of less than 70. ?6.  See discussion #5 above. ? ? ?Dispo:  Return in about 2 months (around 09/15/2021).  ?  ? ?Medication Adjustments/Labs and Tests Ordered: ?Current medicines are reviewed at length with the patient today.  Concerns regarding medicines are outlined above.  ? ?Tests Ordered: ?No orders of the defined types were placed in this encounter. ? ? ?Medication Changes: ?No orders of the defined types were placed in this encounter. ? ? ?History of Present Illness:   ? ?FOCUSED PROBLEM LIST:   ?1.  Paroxysmal atrial fibrillation on Eliquis; CHADS2-VASC 2 ?2.  Hypertension ?3.  Type 2 diabetes on  metformin ?4.  Hyperlipidemia ?5.  Multivessel coronary calcification and aortic atherosclerosis on CT scan 2022 ? ?The patient is a 61 y.o. male with the indicated medical history here for emergency room follow-up.  He had presented to the emergency department twice within the last month for atrial fibrillation.  He was seen in the emergency department yesterday.  He was noted to be in a rapid ventricular rate.  He was prescribed diltiazem.  Repeat EKG demonstrated normal sinus rhythm.  He was discharged.  He was converted to Toprol-XL at 50 mg and found that his heart rate was quite low and so this was decreased to 25 mg.  He is relatively asymptomatic.  He does not feel palpitations even though he is in rapid atrial fibrillation.  He walks 3 to 4 miles a few days a week.  He denies any exertional angina.  He has had no presyncope or syncope.  He denies any peripheral edema, paroxysmal nocturnal dyspnea, or severe bleeding or bruising while on Eliquis.  He is otherwise well without significant complaints. ?  ?Previous Medical History: ?Past Medical History:  ?Diagnosis Date  ? A-fib ([place])   ? Diabetes mellitus without complication ([place])   ? High cholesterol   ? Hypertension   ? ? ? ?Current Medications: ?Current Meds  ?Medication Sig  ? apixaban (ELIQUIS) 5 MG TABS tablet Take 1 tablet (5 mg total) by mouth 2 (two) times daily.  ? atorvastatin (LIPITOR) 20 MG tablet Take 10 mg by mouth daily.  ?  esomeprazole (NEXIUM) 40 MG capsule Take 40 mg by mouth daily.  ? glimepiride (AMARYL) 1 MG tablet Take 1 mg by mouth every evening.  ? HYDROcodone-acetaminophen (NORCO/VICODIN) 5-325 MG tablet One tablet every six hours for pain.  Limit 7 days.  ? lisinopril (PRINIVIL,ZESTRIL) 20 MG tablet Take 20 mg by mouth daily.  ? metFORMIN (GLUCOPHAGE) 1000 MG tablet Take 1,000 mg by mouth 2 (two) times daily.  ? metoprolol succinate (TOPROL XL) 50 MG 24 hr tablet Take 1 tablet (50 mg total) by mouth daily. Take with or immediately  following a meal.  ? Multiple Vitamin (MULTIVITAMIN WITH MINERALS) TABS tablet Take 1 tablet by mouth daily.  ? pioglitazone (ACTOS) 15 MG tablet Take 15 mg by mouth at bedtime.   ?  ? ?Allergies:    ?Patient has no known allergies.  ? ?Social History:   ?Social History  ? ?Tobacco Use  ? Smoking status: Former  ?  Packs/day: 0.50  ?  Types: Cigarettes  ?  Quit date: 07/09/2021  ?  Years since quitting: 0.0  ?Vaping Use  ? Vaping Use: Never used  ?Substance Use Topics  ? Alcohol use: No  ? Drug use: No  ?  ? ?Family Hx: ?History reviewed. No pertinent family history.  ? ?Review of Systems:   ?Please see the history of present illness.    ?All other systems reviewed and are negative. ?  ? ? ?EKGs/Labs/Other Test Reviewed:   ? ?EKG: EKG yesterday demonstrates atrial fibrillation with a rapid ventricular rate. ? ?Prior CV studies: ?None available ? ?Imaging studies that I have independently reviewed today:  ? ?CT 2022 ?1. Lung-RADS 2, benign appearance or behavior. Continue annual ?screening with low-dose chest CT without contrast in 12 months. ?2. Distal esophageal wall thickening, suspicious for esophagitis. ?3. Aortic Atherosclerosis (ICD10-I70.0) and Emphysema (ICD10-J43.9). ?Coronary artery atherosclerosis. ? ?Recent Labs: ?06/28/2021: ALT 21 ?07/13/2021: BUN 21; Creatinine, Ser 1.05; Hemoglobin 15.1; Magnesium 1.7; Platelets 247; Potassium 3.8; Sodium 136  ? ?Recent Lipid Panel ?No results found for: CHOL, TRIG, HDL, LDLCALC, LDLDIRECT ? ?Risk Assessment/Calculations:   ? ? ?CHA2DS2-VASc Score = 2  ? This indicates a 2.2% annual risk of stroke. ?The patient's score is based upon: ?CHF History: 0 ?HTN History: 1 ?Diabetes History: 1 ?Stroke History: 0 ?Vascular Disease History: 0 ?Age Score: 0 ?Gender Score: 0 ?  ? ?    ? ?Physical Exam:   ? ?VS:  BP 130/66   Pulse 80   Ht '5\' 11"'$  (1.803 m)   Wt 208 lb (94.3 kg)   SpO2 99%   BMI 29.01 kg/m?    ?Wt Readings from Last 3 Encounters:  ?07/16/21 208 lb (94.3 kg)   ?07/13/21 207 lb 5 oz (94 kg)  ?07/09/21 212 lb 12.8 oz (96.5 kg)  ?  ?GENERAL:  No apparent distress, AOx3 ?HEENT:  No carotid bruits, +2 carotid impulses, no scleral icterus ?CAR: RRR no murmurs, gallops, rubs, or thrills ?RES:  Clear to auscultation bilaterally ?ABD:  Soft, nontender, nondistended, positive bowel sounds x 4 ?VASC:  +2 radial pulses, +2 carotid pulses, palpable pedal pulses ?NEURO:  CN 2-12 grossly intact; motor and sensory grossly intact ?PSYCH:  No active depression or anxiety ?EXT:  No edema, ecchymosis, or cyanosis ? ?Signed, ?Early Osmond, MD  ?07/16/2021 2:20 PM    ?[facility] ?[address], [place], [place]  [id number] ?Phone: [phone]; Fax: [phone]  ? ?Note:  This document was prepared  using Systems analyst and may include unintentional dictation errors. ?

## 2021-07-23 ENCOUNTER — Ambulatory Visit: Payer: BC Managed Care – PPO | Admitting: Cardiology

## 2021-07-26 DIAGNOSIS — R221 Localized swelling, mass and lump, neck: Secondary | ICD-10-CM | POA: Diagnosis not present

## 2021-07-26 DIAGNOSIS — I480 Paroxysmal atrial fibrillation: Secondary | ICD-10-CM | POA: Diagnosis not present

## 2021-07-30 ENCOUNTER — Other Ambulatory Visit: Payer: Self-pay | Admitting: Internal Medicine

## 2021-07-30 ENCOUNTER — Ambulatory Visit: Payer: BC Managed Care – PPO | Admitting: Orthopaedic Surgery

## 2021-07-30 ENCOUNTER — Encounter: Payer: Self-pay | Admitting: Cardiology

## 2021-07-30 ENCOUNTER — Other Ambulatory Visit (HOSPITAL_COMMUNITY): Payer: Self-pay | Admitting: Internal Medicine

## 2021-07-30 DIAGNOSIS — R221 Localized swelling, mass and lump, neck: Secondary | ICD-10-CM

## 2021-07-31 ENCOUNTER — Other Ambulatory Visit: Payer: Self-pay

## 2021-07-31 ENCOUNTER — Ambulatory Visit (HOSPITAL_COMMUNITY): Payer: BC Managed Care – PPO | Attending: Cardiology

## 2021-07-31 DIAGNOSIS — I480 Paroxysmal atrial fibrillation: Secondary | ICD-10-CM

## 2021-07-31 LAB — ECHOCARDIOGRAM COMPLETE
AR max vel: 1.57 cm2
AV Area VTI: 1.73 cm2
AV Area mean vel: 1.55 cm2
AV Mean grad: 18 mmHg
AV Peak grad: 31.8 mmHg
Ao pk vel: 2.82 m/s
Area-P 1/2: 3.5 cm2
P 1/2 time: 922 msec
S' Lateral: 4.6 cm

## 2021-08-01 ENCOUNTER — Telehealth: Payer: Self-pay | Admitting: Orthopaedic Surgery

## 2021-08-01 ENCOUNTER — Ambulatory Visit (HOSPITAL_COMMUNITY): Payer: BC Managed Care – PPO

## 2021-08-01 ENCOUNTER — Encounter (HOSPITAL_COMMUNITY): Payer: Self-pay

## 2021-08-01 MED ORDER — HYDROCODONE-ACETAMINOPHEN 5-325 MG PO TABS: ORAL_TABLET | ORAL | 0 refills | Status: DC

## 2021-08-01 NOTE — Telephone Encounter (Signed)
Patient called wants refill on his pain medicine  ? ?HYDROcodone-acetaminophen (NORCO/VICODIN) 5-325 MG tablet ? ?Pharmacy:  [facility] on [address],  ?

## 2021-08-02 ENCOUNTER — Encounter (HOSPITAL_COMMUNITY): Payer: Self-pay | Admitting: Emergency Medicine

## 2021-08-02 ENCOUNTER — Observation Stay (HOSPITAL_COMMUNITY)
Admission: EM | Admit: 2021-08-02 | Discharge: 2021-08-04 | Disposition: A | Discharge status: Home / Self Care | Payer: BC Managed Care – PPO | Attending: Family Medicine | Admitting: Family Medicine

## 2021-08-02 ENCOUNTER — Inpatient Hospital Stay (HOSPITAL_COMMUNITY): Payer: BC Managed Care – PPO

## 2021-08-02 ENCOUNTER — Emergency Department (HOSPITAL_COMMUNITY): Payer: BC Managed Care – PPO

## 2021-08-02 ENCOUNTER — Other Ambulatory Visit: Payer: Self-pay

## 2021-08-02 DIAGNOSIS — I429 Cardiomyopathy, unspecified: Secondary | ICD-10-CM | POA: Diagnosis present

## 2021-08-02 DIAGNOSIS — I509 Heart failure, unspecified: Secondary | ICD-10-CM | POA: Insufficient documentation

## 2021-08-02 DIAGNOSIS — I5021 Acute systolic (congestive) heart failure: Secondary | ICD-10-CM | POA: Diagnosis not present

## 2021-08-02 DIAGNOSIS — I10 Essential (primary) hypertension: Secondary | ICD-10-CM | POA: Diagnosis not present

## 2021-08-02 DIAGNOSIS — R590 Localized enlarged lymph nodes: Secondary | ICD-10-CM | POA: Diagnosis not present

## 2021-08-02 DIAGNOSIS — Z7901 Long term (current) use of anticoagulants: Secondary | ICD-10-CM | POA: Diagnosis not present

## 2021-08-02 DIAGNOSIS — I480 Paroxysmal atrial fibrillation: Secondary | ICD-10-CM | POA: Diagnosis not present

## 2021-08-02 DIAGNOSIS — I350 Nonrheumatic aortic (valve) stenosis: Secondary | ICD-10-CM

## 2021-08-02 DIAGNOSIS — E1169 Type 2 diabetes mellitus with other specified complication: Secondary | ICD-10-CM | POA: Diagnosis present

## 2021-08-02 DIAGNOSIS — R002 Palpitations: Secondary | ICD-10-CM | POA: Diagnosis not present

## 2021-08-02 DIAGNOSIS — E785 Hyperlipidemia, unspecified: Secondary | ICD-10-CM

## 2021-08-02 DIAGNOSIS — R079 Chest pain, unspecified: Secondary | ICD-10-CM | POA: Diagnosis not present

## 2021-08-02 DIAGNOSIS — Z7982 Long term (current) use of aspirin: Secondary | ICD-10-CM | POA: Diagnosis not present

## 2021-08-02 DIAGNOSIS — Z720 Tobacco use: Secondary | ICD-10-CM | POA: Diagnosis present

## 2021-08-02 DIAGNOSIS — Z79899 Other long term (current) drug therapy: Secondary | ICD-10-CM

## 2021-08-02 DIAGNOSIS — E7800 Pure hypercholesterolemia, unspecified: Secondary | ICD-10-CM | POA: Diagnosis present

## 2021-08-02 DIAGNOSIS — Z7984 Long term (current) use of oral hypoglycemic drugs: Secondary | ICD-10-CM

## 2021-08-02 DIAGNOSIS — F17210 Nicotine dependence, cigarettes, uncomplicated: Secondary | ICD-10-CM | POA: Diagnosis present

## 2021-08-02 DIAGNOSIS — I493 Ventricular premature depolarization: Secondary | ICD-10-CM | POA: Diagnosis present

## 2021-08-02 DIAGNOSIS — E119 Type 2 diabetes mellitus without complications: Secondary | ICD-10-CM

## 2021-08-02 DIAGNOSIS — I4891 Unspecified atrial fibrillation: Secondary | ICD-10-CM | POA: Diagnosis not present

## 2021-08-02 DIAGNOSIS — I5022 Chronic systolic (congestive) heart failure: Secondary | ICD-10-CM | POA: Diagnosis present

## 2021-08-02 DIAGNOSIS — I110 Hypertensive heart disease with heart failure: Secondary | ICD-10-CM | POA: Diagnosis not present

## 2021-08-02 DIAGNOSIS — R221 Localized swelling, mass and lump, neck: Secondary | ICD-10-CM | POA: Diagnosis present

## 2021-08-02 DIAGNOSIS — J341 Cyst and mucocele of nose and nasal sinus: Secondary | ICD-10-CM | POA: Diagnosis not present

## 2021-08-02 DIAGNOSIS — Z20822 Contact with and (suspected) exposure to covid-19: Secondary | ICD-10-CM | POA: Diagnosis present

## 2021-08-02 DIAGNOSIS — I4892 Unspecified atrial flutter: Secondary | ICD-10-CM | POA: Diagnosis present

## 2021-08-02 LAB — TROPONIN I (HIGH SENSITIVITY)
Troponin I (High Sensitivity): 6 ng/L (ref <18)
Troponin I (High Sensitivity): 6 ng/L (ref <18)

## 2021-08-02 LAB — MAGNESIUM: Magnesium: 1.8 mg/dL (ref 1.7–2.4)

## 2021-08-02 LAB — CBC
HCT: 44.9 % (ref 39.0–52.0)
Hemoglobin: 15.1 g/dL (ref 13.0–17.0)
MCH: 31.1 pg (ref 26.0–34.0)
MCHC: 33.6 g/dL (ref 30.0–36.0)
MCV: 92.4 fL (ref 80.0–100.0)
Platelets: 241 10*3/uL (ref 150–400)
RBC: 4.86 MIL/uL (ref 4.22–5.81)
RDW: 13.2 % (ref 11.5–15.5)
WBC: 10.5 10*3/uL (ref 4.0–10.5)
nRBC: 0 % (ref 0.0–0.2)

## 2021-08-02 LAB — HEMOGLOBIN A1C
Hgb A1c MFr Bld: 6.8 % — ABNORMAL HIGH (ref 4.8–5.6)
Mean Plasma Glucose: 148.46 mg/dL

## 2021-08-02 LAB — BASIC METABOLIC PANEL
Anion gap: 9 (ref 5–15)
BUN: 26 mg/dL — ABNORMAL HIGH (ref 8–23)
CO2: 22 mmol/L (ref 22–32)
Calcium: 9.2 mg/dL (ref 8.9–10.3)
Chloride: 106 mmol/L (ref 98–111)
Creatinine, Ser: 1.04 mg/dL (ref 0.61–1.24)
GFR, Estimated: >60 mL/min (ref >60)
Glucose, Bld: 151 mg/dL — ABNORMAL HIGH (ref 70–99)
Potassium: 3.7 mmol/L (ref 3.5–5.1)
Sodium: 137 mmol/L (ref 135–145)

## 2021-08-02 LAB — GLUCOSE, CAPILLARY
Glucose-Capillary: 208 mg/dL — ABNORMAL HIGH (ref 70–99)
Glucose-Capillary: 149 mg/dL — ABNORMAL HIGH (ref 70–99)

## 2021-08-02 LAB — HIV ANTIBODY (ROUTINE TESTING W REFLEX): HIV Screen 4th Generation wRfx: NONREACTIVE

## 2021-08-02 LAB — TSH: TSH: 0.481 u[IU]/mL (ref 0.350–4.500)

## 2021-08-02 LAB — MRSA NEXT GEN BY PCR, NASAL: MRSA by PCR Next Gen: NOT DETECTED

## 2021-08-02 MED ORDER — ONDANSETRON HCL 4 MG/2ML IJ SOLN: 4.0000 mg | Freq: Four times a day (QID) | INTRAMUSCULAR | Status: DC | PRN

## 2021-08-02 MED ORDER — ACETAMINOPHEN 650 MG RE SUPP: 650.0000 mg | Freq: Four times a day (QID) | RECTAL | Status: DC | PRN

## 2021-08-02 MED ORDER — HYDROCODONE-ACETAMINOPHEN 5-325 MG PO TABS: 1.0000 | ORAL_TABLET | Freq: Four times a day (QID) | ORAL | Status: DC | PRN

## 2021-08-02 MED ORDER — APIXABAN 5 MG PO TABS
5.0000 mg | ORAL_TABLET | Freq: Two times a day (BID) | ORAL | Status: DC
  Administered 2021-08-02 – 2021-08-04 (×4): 5 mg via ORAL
  Filled 2021-08-02 (×5): qty 1

## 2021-08-02 MED ORDER — EMPAGLIFLOZIN 10 MG PO TABS
10.0000 mg | ORAL_TABLET | Freq: Every day | ORAL | Status: DC
Start: 2021-08-03 — End: 2021-08-04
  Administered 2021-08-03 – 2021-08-04 (×2): 10 mg via ORAL
  Filled 2021-08-02 (×3): qty 1

## 2021-08-02 MED ORDER — IOHEXOL 300 MG/ML  SOLN
75.0000 mL | Freq: Once | INTRAMUSCULAR | Status: AC | PRN
  Administered 2021-08-02: 75 mL via INTRAVENOUS

## 2021-08-02 MED ORDER — ONDANSETRON HCL 4 MG PO TABS: 4.0000 mg | ORAL_TABLET | Freq: Four times a day (QID) | ORAL | Status: DC | PRN

## 2021-08-02 MED ORDER — CHLORHEXIDINE GLUCONATE CLOTH 2 % EX PADS: 6.0000 | MEDICATED_PAD | Freq: Every day | CUTANEOUS | Status: DC

## 2021-08-02 MED ORDER — SODIUM CHLORIDE 0.9 % IV BOLUS
500.0000 mL | Freq: Once | INTRAVENOUS | Status: AC
Start: 2021-08-02 — End: 2021-08-02
  Administered 2021-08-02: 500 mL via INTRAVENOUS

## 2021-08-02 MED ORDER — ADULT MULTIVITAMIN W/MINERALS CH
1.0000 | ORAL_TABLET | Freq: Every day | ORAL | Status: DC
  Administered 2021-08-02 – 2021-08-04 (×3): 1 via ORAL
  Filled 2021-08-02 (×3): qty 1

## 2021-08-02 MED ORDER — BISACODYL 5 MG PO TBEC: 5.0000 mg | DELAYED_RELEASE_TABLET | Freq: Every day | ORAL | Status: DC | PRN

## 2021-08-02 MED ORDER — MAGNESIUM SULFATE 2 GM/50ML IV SOLN
2.0000 g | Freq: Once | INTRAVENOUS | Status: AC
  Administered 2021-08-02: 2 g via INTRAVENOUS
  Filled 2021-08-02: qty 50

## 2021-08-02 MED ORDER — ATORVASTATIN CALCIUM 40 MG PO TABS
40.0000 mg | ORAL_TABLET | Freq: Every evening | ORAL | Status: DC
  Filled 2021-08-02: qty 1

## 2021-08-02 MED ORDER — INSULIN ASPART 100 UNIT/ML IJ SOLN
0.0000 [IU] | Freq: Three times a day (TID) | INTRAMUSCULAR | Status: DC
  Administered 2021-08-02: 8 [IU] via SUBCUTANEOUS
  Administered 2021-08-03 – 2021-08-04 (×3): 3 [IU] via SUBCUTANEOUS

## 2021-08-02 MED ORDER — NICOTINE 21 MG/24HR TD PT24
21.0000 mg | MEDICATED_PATCH | Freq: Every day | TRANSDERMAL | Status: DC
  Filled 2021-08-02 (×3): qty 1

## 2021-08-02 MED ORDER — ATORVASTATIN CALCIUM 40 MG PO TABS
40.0000 mg | ORAL_TABLET | Freq: Every day | ORAL | Status: DC
  Administered 2021-08-03 – 2021-08-04 (×2): 40 mg via ORAL
  Filled 2021-08-02 (×2): qty 1

## 2021-08-02 MED ORDER — INSULIN ASPART 100 UNIT/ML IJ SOLN
3.0000 [IU] | Freq: Three times a day (TID) | INTRAMUSCULAR | Status: DC
  Administered 2021-08-02 – 2021-08-04 (×4): 3 [IU] via SUBCUTANEOUS

## 2021-08-02 MED ORDER — ACETAMINOPHEN 325 MG PO TABS: 650.0000 mg | ORAL_TABLET | Freq: Four times a day (QID) | ORAL | Status: DC | PRN

## 2021-08-02 MED ORDER — PANTOPRAZOLE SODIUM 40 MG PO TBEC
40.0000 mg | DELAYED_RELEASE_TABLET | Freq: Every day | ORAL | Status: DC
  Administered 2021-08-03 – 2021-08-04 (×2): 40 mg via ORAL
  Filled 2021-08-02 (×3): qty 1

## 2021-08-02 MED ORDER — DILTIAZEM HCL 25 MG/5ML IV SOLN
10.0000 mg | Freq: Once | INTRAVENOUS | Status: AC
  Administered 2021-08-02: 10 mg via INTRAVENOUS

## 2021-08-02 MED ORDER — DILTIAZEM HCL-DEXTROSE 125-5 MG/125ML-% IV SOLN (PREMIX)
5.0000 mg/h | INTRAVENOUS | Status: DC
  Administered 2021-08-02: 5 mg/h via INTRAVENOUS
  Filled 2021-08-02: qty 125

## 2021-08-02 MED ORDER — POTASSIUM CHLORIDE CRYS ER 20 MEQ PO TBCR
40.0000 meq | EXTENDED_RELEASE_TABLET | Freq: Once | ORAL | Status: AC
  Administered 2021-08-02: 40 meq via ORAL
  Filled 2021-08-02: qty 2

## 2021-08-02 MED ORDER — METOPROLOL TARTRATE 25 MG PO TABS
25.0000 mg | ORAL_TABLET | Freq: Four times a day (QID) | ORAL | Status: DC
  Administered 2021-08-02 (×3): 25 mg via ORAL
  Filled 2021-08-02 (×3): qty 1

## 2021-08-02 NOTE — Progress Notes (Incomplete)
Patient seen and discussed with PA Ahmed Prima, I agree with her documentation. 61 yo male history of PAF, DM2, HTN, HL ? ? ?06/27/21 ER visit with afib from pcp office, new diagnosis at that time. Self converted with diltiazem, discharged from ER. Noted PVCs and bigeminy that admission.  ? ?07/13/21 ER visit with afib with RVR. Was on toprol '50mg'$  daily at the time. Self converted after IV diltiazem, discharged from ER ? ?Presents again with tachycardia. In afib with RVR in ER. Given IV dilt '10mg'$  x 1, then started on drip.  ? ?K 3.7 CR 1.04 BUN 26 WBC 10.5 Hgb 15.1 Plt 241 ?Trop 6-->6 ?EKG afib 130 ?CXR no acute process ? ?07/31/21 echo: LVEF 16-96%, grade I diastolic dysfunction, mild RV dysfunction, mild LAE LAVI 37, mild MR, mild AI, mild to mod AS mean grad 18, AVA VTI 1.73 ? ?12/2020 CT chest: coronary atherosclerosis ? ? ?Recurrent afib with RVR, multiple ER visits over the last several weeks. Has been on toprol '25mg'$  bid. Reports some low HRs by home monitoring but unclear if could be related to PVCs. Would monitor rates here with attempts at more aggressive rate control. If fails then would need to consider antiarrhythmic therapy. With mild LV dysfunction and coronary calcifications would likely avoid class IC agents. Could be a candidate for sotalol vs dofetilide but would need EP evaluation to commit to that strategy. I think if can tolerate high av nodal agents inpatient then could see EP outpatient, if not able would need to consider inpatient evaluation ? ?Has converted to SR with PVCs, bigeminy. Start lopressor '25mg'$  every 6 hours and wean dilt drip. Can consolidate back to toprol if tolerates higher lopressor dosing, if tolerates lopressor '25mg'$  every 6 hours could transition to toprol '50mg'$  bid.  ?*must measure HRs by tele, dynamap likely to underestimate HR due to bigeminy and PVCs.  ? ?Keep K at 4, Mg at 2. Will give 40 mEq oral KCl ? ? ?Carlyle Dolly MD ?

## 2021-08-02 NOTE — Assessment & Plan Note (Addendum)
--  strongly advised to stop all tobacco use ?--Pt is cutting down to 0.5ppd ?--offered nicotine patch while in hospital  ?

## 2021-08-02 NOTE — Assessment & Plan Note (Signed)
--  resume home atorvastatin 40 mg daily  ?

## 2021-08-02 NOTE — Hospital Course (Addendum)
61 year old gentleman with recently diagnosed paroxysmal atrial fibrillation, type 2 diabetes mellitus, hypertension, tobacco use, hyperlipidemia, coronary artery calcifications, pulmonary nodules presented to the emergency department with recurrent palpitations.  He has had several recent ED visits associated with the diagnosis of his paroxysmal atrial fibrillation.  He has been on Cardizem that was subsequently discontinued and apixaban and has been maintained on Toprol-XL 25 mg twice daily.  He is followed by Dr. Ali Lowe and had a recent TTE with findings of EF 45-50% and grade 1 DD, mildly reduced RV function and mild to moderate aortic valve stenosis.   ? ?He started having palpitations this morning and his smart watch reported elevated heart rates.  He has been fully compliant with his toprol and apixaban.  He has cut back on cigarette smoking.  He has been physically active at baseline.  He denies having chest pain symptoms.  His EKG shows Afib with RVR HR 120-130.  He was started on IV cardizem infusion and cardiology was consulted who requested admission to hospital.  Pt also reported that he recently saw his PCP and was scheduled for an outpatient CT soft tissue neck for a newly discovered left neck mass.  ? ?08/03/2021: Pt tolerated higher dose metoprolol overnight.  He remains rate controlled. Will start on toprol XL 50 mg BID and transfer to telemetry bed. If HR can remain controlled on this regimen likely can discharge home 4/2.  Wife updated at bedside. Pt noted to have possible metastatic lesion on lymph node from neck CT.  Will get CT chest/abd pelv to look for primary malignancy.  ? ?08/04/2021:  HR has remained well controlled on toprol XL 50 mg BID.  DC home.  Follow up with cardiology as scheduled.  CT scan chest/abd/pelvis did not show any findings of occult malignancy. Ambulatory referral to ENT made for further evaluation of neck lesions.  ?

## 2021-08-02 NOTE — Consult Note (Addendum)
?Cardiology Consultation:  ? ?Patient ID: Trevor Steele ?MRN: [id number]; DOB: November 29, 1960 ? ?Admit date: 08/02/2021 ?Date of Consult: 08/02/2021 ? ?PCP:  [name], MD ?  ?[facility] Prov[facility] ?Cardiologist:  Early Osmond, MD      ? ? ?Patient Profile:  ? ?Trevor Steele is a 61 y.o. male with a hx of paroxysmal atrial fibrillation (diagnosed in 06/2021), HTN, HLD, pulmonary nodules and coronary artery calcifications by prior CT in 12/2020 who is being seen 08/02/2021 for the evaluation of atrial fibrillation at the request of Dr. Roderic Palau. ? ?History of Present Illness:  ? ?Trevor Steele previously presented to [facility] ED on 06/28/2021 for evaluation of tachycardia after having been evaluated by his PCP earlier in the day and found to be in atrial fibrillation with RVR. He received IV Cardizem and IV Lopressor and converted back to normal sinus rhythm with ventricular bigeminy noted on his tracings. Cardizem was then discontinued and his ectopy improved. He was discharged on Eliquis 5 mg twice daily and Toprol-XL 50 mg daily. This was subsequently reduced to 25 mg daily as he had bradycardia when in normal sinus rhythm. ? ?He presented back to the ED on 07/13/2021 for recurrent tachycardia which was noted on his smart watch and he denied any associated symptoms at that time. He did receive IV Cardizem with improvement in his rates and converted back to normal sinus rhythm prior to discharge. ? ?He did follow-up with Dr. Ali Lowe on 07/16/2021 and reported overall being asymptomatic when in atrial fibrillation.  He was active at baseline, walking 3 to 4 miles each day.  It was recommended to an echocardiogram for further evaluation and he was started on Jardiance 10 mg daily.  His echocardiogram was performed on 07/31/2021 and showed a preserved EF of 45 to 50% with grade 1 diastolic dysfunction and mildly reduced RV function.  He did have mild to moderate aortic valve stenosis and mild aortic  regurgitation. ? ?He presented back to the ED this morning for recurrent tachycardia.  He has noticed this on his smart watch and reports his heart rate has been elevated since last night. He reports that he might have felt an occasional "flutter" in his chest last night but denies any persistent palpitations and denies any symptoms at this time. He does walk for several miles a day and denies any recent chest pain or dyspnea on exertion with these activities.  No recent orthopnea, PND or pitting edema. He does consume several caffeinated beverages a day but denies any alcohol use. He has significantly reduced his tobacco use as he was previously smoking 1.5 ppd and is now down to less than 0.5 ppd.  ? ?Initial labs showed WBC 10.5, Hgb 15.1, platelets 241, Na+ 137, K+ 3.7 and creatinine 1.04. Initial and repeat Hs Troponin have been flat at 6. CXR showing hyperexpansion without acute cardiopulmonary findings. EKG shows atrial fibrillation with RVR, HR 127 with LVH. ? ?He received IV Cardizem '10mg'$  while in the ED and has been started on a Cardizem drip. Rates have improved into the low-100's to 110's but he is having frequent ectopy with PVC's and ventricular bigeminy and couplets.  ? ? ?Past Medical History:  ?Diagnosis Date  ? A-fib ([place])   ? Diabetes mellitus without complication ([place])   ? High cholesterol   ? Hypertension   ? ? ?Past Surgical History:  ?Procedure Laterality Date  ? BACK SURGERY    ?  ? ?Home Medications:  ?Prior  to Admission medications   ?Medication Sig Start Date End Date Taking? Authorizing Provider  ?acetaminophen (TYLENOL) 325 MG tablet Take 650 mg by mouth every 6 (six) hours as needed for mild pain.   Yes [provider]  ?apixaban (ELIQUIS) 5 MG TABS tablet Take 1 tablet (5 mg total) by mouth 2 (two) times daily. 06/28/21 08/02/21 Yes Fransico Meadow, PA-C  ?atorvastatin (LIPITOR) 40 MG tablet Take 1 tablet (40 mg total) by mouth daily. 07/16/21  Yes Early Osmond, MD   ?empagliflozin (JARDIANCE) 10 MG TABS tablet Take 1 tablet (10 mg total) by mouth daily before breakfast. 07/16/21  Yes Early Osmond, MD  ?esomeprazole (NEXIUM) 40 MG capsule Take 40 mg by mouth daily. 10/10/15  Yes [provider]  ?glimepiride (AMARYL) 1 MG tablet Take 1 mg by mouth every evening.   Yes [provider]  ?HYDROcodone-acetaminophen (NORCO/VICODIN) 5-325 MG tablet One tablet every six hours for pain.  Limit 7 days. ?Patient taking differently: Take 1 tablet by mouth every 6 (six) hours as needed for moderate pain. One tablet every six hours for pain.  Limit 7 days. 08/01/21  Yes Sanjuana Kava, MD  ?lisinopril (PRINIVIL,ZESTRIL) 20 MG tablet Take 20 mg by mouth daily. 09/28/15  Yes [provider]  ?metFORMIN (GLUCOPHAGE) 1000 MG tablet Take 1,000 mg by mouth 2 (two) times daily. 10/10/15  Yes [provider]  ?metoprolol succinate (TOPROL-XL) 25 MG 24 hr tablet Take 25 mg by mouth in the morning and at bedtime.   Yes [provider]  ?Multiple Vitamin (MULTIVITAMIN WITH MINERALS) TABS tablet Take 1 tablet by mouth daily.   Yes [provider]  ?pioglitazone (ACTOS) 15 MG tablet Take 15 mg by mouth at bedtime.  09/21/15  Yes [provider]  ?aspirin EC 81 MG tablet Take 81 mg by mouth daily. Swallow whole. ?Patient not taking: Reported on 07/16/2021    [provider]  ? ? ?Inpatient Medications: ?Scheduled Meds: ? ?Continuous Infusions: ? diltiazem (CARDIZEM) infusion 12.5 mg/hr (08/02/21 0849)  ? ?PRN Meds: ? ? ?Allergies:   No Known Allergies ? ?Social History:   ?Social History  ? ?Socioeconomic History  ? Marital status: Married  ?  Spouse name: Not on file  ? Number of children: Not on file  ? Years of education: Not on file  ? Highest education level: Not on file  ?Occupational History  ? Not on file  ?Tobacco Use  ? Smoking status: Former  ?  Packs/day: 0.50  ?  Types: Cigarettes  ?  Quit date: 07/09/2021  ?  Years since  quitting: 0.0  ? Smokeless tobacco: Not on file  ?Vaping Use  ? Vaping Use: Never used  ?Substance and Sexual Activity  ? Alcohol use: No  ? Drug use: No  ? Sexual activity: Not on file  ?Other Topics Concern  ? Not on file  ?Social History Narrative  ? Not on file  ? ?Social Determinants of Health  ? ?Financial Resource Strain: Not on file  ?Food Insecurity: Not on file  ?Transportation Needs: Not on file  ?Physical Activity: Not on file  ?Stress: Not on file  ?Social Connections: Not on file  ?Intimate Partner Violence: Not on file  ?  ?Family History:   ?No family history on file. No known cardiac arrhythmias.  ? ?ROS:  ?Please see the history of present illness.  ? ?All other ROS reviewed and negative.    ? ?Physical Exam/Data:  ? ?  Vitals:  ? 08/02/21 1000 08/02/21 1030 08/02/21 1035 08/02/21 1113  ?BP: (!) 133/98 (!) 83/68 107/75   ?Pulse: (!) 55 (!) 122 (!) 119 (!) 120  ?Resp: '16 16 19 16  '$ ?Temp:      ?TempSrc:      ?SpO2: 100% 100% 100% 100%  ?Weight:      ?Height:      ? ? ?Intake/Output Summary (Last 24 hours) at 08/02/2021 1115 ?Last data filed at 08/02/2021 0849 ?Gross per 24 hour  ?Intake 500 ml  ?Output --  ?Net 500 ml  ? ? ?  08/02/2021  ?  7:06 AM 07/16/2021  ?  1:46 PM 07/13/2021  ?  5:58 PM  ?Last 3 Weights  ?Weight (lbs) 202 lb 208 lb 207 lb 5 oz  ?Weight (kg) 91.627 kg 94.348 kg 94.036 kg  ?   ?Body mass index is 28.17 kg/m?.  ?General:  Well nourished, well developed male appearing in no acute distress. ?HEENT: normal ?Neck: no JVD. Mobile swelling along left neck.  ?Vascular: No carotid bruits; Distal pulses 2+ bilaterally ?Cardiac:  normal S1, S2; Irregularly irregular, tachycardiac. 2/6 SEM along RUSB.  ?Lungs:  clear to auscultation bilaterally, no wheezing, rhonchi or rales  ?Abd: soft, nontender, no hepatomegaly  ?Ext: no pitting edema ?Musculoskeletal:  No deformities, BUE and BLE strength normal and equal ?Skin: warm and dry  ?Neuro:  CNs 2-12 intact, no focal abnormalities noted ?Psych:   Normal affect  ? ?EKG:  The EKG was personally reviewed and demonstrates: Atrial fibrillation with RVR, HR 127 with LVH. ? ? ?Relevant CV Studies: ? ?Echocardiogram: 07/31/2021 ?IMPRESSIONS  ? ? ? 1. Left ventricular ejection fraction,

## 2021-08-02 NOTE — ED Triage Notes (Signed)
Pt reports palpitations that started last night. Pt reports new onset A-fib in February. Pt reports taking eliquis and metoprolol.  ?

## 2021-08-02 NOTE — Assessment & Plan Note (Signed)
--  continue apixaban 5 mg BID for full anticoagulation ?

## 2021-08-02 NOTE — Assessment & Plan Note (Addendum)
--  pt was scheduled outpatient to have a CT soft tissue neck ?--CT suspicious for metastatic lesion in lymph node.  ?--CT chest/abd/pelvis did not show a primary malignancy ?--ambulatory referral to ENT made ?

## 2021-08-02 NOTE — H&P (Addendum)
?History and Physical  ?[place] ? ?Trevor Steele DOB: August 26, 1960 DOA: 08/02/2021 ? ?PCP: [name], MD  ?Patient coming from: Home  ?Level of care: Stepdown ? ?I have personally briefly reviewed patient's old medical records in [place] ? ?Chief Complaint: palpitations ? ?HPI: Trevor Steele is 61 year old gentleman with recently diagnosed paroxysmal atrial fibrillation, type 2 diabetes mellitus, hypertension, tobacco use, hyperlipidemia, coronary artery calcifications, pulmonary nodules presented to the emergency department with recurrent palpitations.  He has had several recent ED visits associated with the diagnosis of his paroxysmal atrial fibrillation.  He has been on Cardizem that was subsequently discontinued and apixaban and has been maintained on Toprol-XL 25 mg twice daily.  He is followed by Dr. Ali Lowe and had a recent TTE with findings of EF 45-50% and grade 1 DD, mildly reduced RV function and mild to moderate aortic valve stenosis.   ? ?He started having palpitations this morning and his smart watch reported elevated heart rates.  He has been fully compliant with his toprol and apixaban.  He has cut back on cigarette smoking.  He has been physically active at baseline.  He denies having chest pain symptoms.  His EKG shows Afib with RVR HR 120-130.  He was started on IV cardizem infusion and cardiology was consulted who requested admission to hospital.  Pt also reported that he recently saw his PCP and was scheduled for an outpatient CT soft tissue neck for a newly discovered left neck mass.  ? ?Review of Systems: Review of Systems  ?Constitutional: Negative.   ?HENT: Negative.    ?Eyes: Negative.   ?Respiratory: Negative.    ?Cardiovascular:  Positive for palpitations and leg swelling. Negative for chest pain.  ?Gastrointestinal: Negative.   ?Genitourinary: Negative.   ?Musculoskeletal: Negative.   ?Skin: Negative.   ?Neurological: Negative.    ?Endo/Heme/Allergies: Negative.   ?Psychiatric/Behavioral: Negative.    ?  ?Past Medical History:  ?Diagnosis Date  ? A-fib ([place])   ? Diabetes mellitus without complication ([place])   ? High cholesterol   ? Hypertension   ? ? ?Past Surgical History:  ?Procedure Laterality Date  ? BACK SURGERY    ? ? ? reports that he quit smoking about 3 weeks ago. His smoking use included cigarettes. He smoked an average of .5 packs per day. He does not have any smokeless tobacco history on file. He reports that he does not drink alcohol and does not use drugs. ? ?No Known Allergies ? ?No family history on file. ? ?Prior to Admission medications   ?Medication Sig Start Date End Date Taking? Authorizing Provider  ?acetaminophen (TYLENOL) 325 MG tablet Take 650 mg by mouth every 6 (six) hours as needed for mild pain.   Yes [provider]  ?apixaban (ELIQUIS) 5 MG TABS tablet Take 1 tablet (5 mg total) by mouth 2 (two) times daily. 06/28/21 08/02/21 Yes Fransico Meadow, PA-C  ?atorvastatin (LIPITOR) 40 MG tablet Take 1 tablet (40 mg total) by mouth daily. 07/16/21  Yes Early Osmond, MD  ?empagliflozin (JARDIANCE) 10 MG TABS tablet Take 1 tablet (10 mg total) by mouth daily before breakfast. 07/16/21  Yes Early Osmond, MD  ?esomeprazole (NEXIUM) 40 MG capsule Take 40 mg by mouth daily. 10/10/15  Yes [provider]  ?glimepiride (AMARYL) 1 MG tablet Take 1 mg by mouth every evening.   Yes [provider]  ?HYDROcodone-acetaminophen (NORCO/VICODIN) 5-325 MG tablet One tablet every six hours for pain.  Limit 7 days. ?Patient taking differently: Take 1 tablet by mouth every 6 (six) hours as needed for moderate pain. One tablet every six hours for pain.  Limit 7 days. 08/01/21  Yes Sanjuana Kava, MD  ?lisinopril (PRINIVIL,ZESTRIL) 20 MG tablet Take 20 mg by mouth daily. 09/28/15  Yes [provider]  ?metFORMIN (GLUCOPHAGE) 1000 MG tablet Take 1,000 mg by mouth 2 (two) times daily. 10/10/15  Yes [provider]  ?metoprolol succinate (TOPROL-XL) 25 MG 24 hr tablet Take 25 mg by mouth in the morning and at bedtime.   Yes [provider]  ?Multiple Vitamin (MULTIVITAMIN WITH MINERALS) TABS tablet Take 1 tablet by mouth daily.   Yes [provider]  ?pioglitazone (ACTOS) 15 MG tablet Take 15 mg by mouth at bedtime.  09/21/15  Yes [provider]  ?aspirin EC 81 MG tablet Take 81 mg by mouth daily. Swallow whole. ?Patient not taking: Reported on 07/16/2021    [provider]  ? ? ?Physical Exam: ?Vitals:  ? 08/02/21 1030 08/02/21 1035 08/02/21 1113 08/02/21 1133  ?BP: (!) 83/68 107/75  135/64  ?Pulse: (!) 122 (!) 119 (!) 120 (!) 110  ?Resp: '16 19 16 13  '$ ?Temp:   98 ?F (36.7 ?C)   ?TempSrc:   Oral   ?SpO2: 100% 100% 100% 100%  ?Weight:    91.3 kg  ?Height:    '5\' 11"'$  (1.803 m)  ? ? ?Constitutional: strong tobacco odor, NAD, calm, comfortable ?Eyes: PERRL, lids and conjunctivae normal ?ENMT: Mucous membranes are moist. Posterior pharynx clear of any exudate or lesions.Normal dentition.  ?Neck: left sided lymphadenopathy normal, supple, no right sided neck masses, no thyromegaly ?Respiratory: clear to auscultation bilaterally, no wheezing, no crackles. Normal respiratory effort. No accessory muscle use.  ?Cardiovascular: irregularly irregular tachycardic, normal s1, s2 sounds, no murmurs / rubs / gallops. trace extremity edema. 2+ pedal pulses. No carotid bruits.  ?Abdomen: no tenderness, no masses palpated. No hepatosplenomegaly. Bowel sounds positive.  ?Musculoskeletal: no clubbing / cyanosis. No joint deformity upper and lower extremities. Good ROM, no contractures. Normal muscle tone.  ?Skin: no rashes, lesions, ulcers. No induration ?Neurologic: CN 2-12 grossly intact. Sensation intact, DTR normal. Strength 5/5 in all 4.  ?Psychiatric: Normal judgment and insight. Alert and oriented x 3. Normal mood.  ? ?Labs on Admission: I have personally reviewed following labs and imaging  studies ? ?CBC: ?Recent Labs  ?Lab 08/02/21 ?0715  ?WBC 10.5  ?HGB 15.1  ?HCT 44.9  ?MCV 92.4  ?PLT 241  ? ?Basic Metabolic Panel: ?Recent Labs  ?Lab 08/02/21 ?0715  ?NA 137  ?K 3.7  ?CL 106  ?CO2 22  ?GLUCOSE 151*  ?BUN 26*  ?CREATININE 1.04  ?CALCIUM 9.2  ? ?GFR: ?Estimated Creatinine Clearance: 85.1 mL/min (by C-G formula based on SCr of 1.04 mg/dL). ?Liver Function Tests: ?No results for input(s): AST, ALT, ALKPHOS, BILITOT, PROT, ALBUMIN in the last 168 hours. ?No results for input(s): LIPASE, AMYLASE in the last 168 hours. ?No results for input(s): AMMONIA in the last 168 hours. ?Coagulation Profile: ?No results for input(s): INR, PROTIME in the last 168 hours. ?Cardiac Enzymes: ?No results for input(s): CKTOTAL, CKMB, CKMBINDEX, TROPONINI in the last 168 hours. ?BNP (last 3 results) ?No results for input(s): PROBNP in the last 8760 hours. ?HbA1C: ?No results for input(s): HGBA1C in the last 72 hours. ?CBG: ?No results for input(s): GLUCAP in the last 168 hours. ?Lipid Profile: ?No results for input(s): CHOL, HDL, LDLCALC, TRIG, CHOLHDL,  LDLDIRECT in the last 72 hours. ?Thyroid Function Tests: ?No results for input(s): TSH, T4TOTAL, FREET4, T3FREE, THYROIDAB in the last 72 hours. ?Anemia Panel: ?No results for input(s): VITAMINB12, FOLATE, FERRITIN, TIBC, IRON, RETICCTPCT in the last 72 hours. ?Urine analysis: ?   ?Component Value Date/Time  ? [place] YELLOW 10/24/2015 0852  ? APPEARANCEUR CLEAR 10/24/2015 0852  ? LABSPEC 1.025 10/24/2015 0852  ? PHURINE 5.5 10/24/2015 0852  ? [place] NEGATIVE 10/24/2015 0852  ? [place] NEGATIVE 10/24/2015 0852  ? [place] NEGATIVE 10/24/2015 0852  ? [place] NEGATIVE 10/24/2015 0852  ? [place] NEGATIVE 10/24/2015 0852  ? NITRITE NEGATIVE 10/24/2015 0852  ? LEUKOCYTESUR NEGATIVE 10/24/2015 0852  ? ? ?Radiological Exams on Admission: ?DG Chest 2 View ? ?Result Date: 08/02/2021 ?CLINICAL DATA:  New onset AFib.  Chest pain. EXAM: CHEST - 2 VIEW COMPARISON:  06/28/2021  FINDINGS: Lungs are hyperexpanded. The lungs are clear without focal pneumonia, edema, pneumothorax or pleural effusion. Interstitial markings are diffusely coarsened with chronic features. The cardiopericardial silhouette is

## 2021-08-02 NOTE — ED Notes (Signed)
Patient transported to X-ray 

## 2021-08-02 NOTE — ED Provider Notes (Signed)
?[place] ?Provider Note ? ? ?CSN: [id number] ?Arrival date & time: 08/02/21  5170 ? ?  ? ?History ? ?Chief Complaint  ?Patient presents with  ? Palpitations  ? ? ?Trevor Steele is a 61 y.o. male. ? ?Patient complains of palpitations and indigestion.  Patient has a history of atrial fibs, and diabetes and hypertension.  Patient has a past medical history of hypertension ? ?The history is provided by the patient and medical records. No language interpreter was used.  ?Palpitations ?Palpitations quality:  Irregular ?Onset quality:  Sudden ?Timing:  Constant ?Progression:  Worsening ?Chronicity:  Recurrent ?Context: not anxiety   ?Relieved by:  Nothing ?Worsened by:  Nothing ?Associated symptoms: no back pain, no chest pain and no cough   ? ?  ? ?Home Medications ?Prior to Admission medications   ?Medication Sig Start Date End Date Taking? Authorizing Provider  ?apixaban (ELIQUIS) 5 MG TABS tablet Take 1 tablet (5 mg total) by mouth 2 (two) times daily. 06/28/21 07/28/21  Fransico Meadow, PA-C  ?aspirin EC 81 MG tablet Take 81 mg by mouth daily. Swallow whole. ?Patient not taking: Reported on 07/16/2021    [provider]  ?atorvastatin (LIPITOR) 40 MG tablet Take 1 tablet (40 mg total) by mouth daily. 07/16/21   Early Osmond, MD  ?empagliflozin (JARDIANCE) 10 MG TABS tablet Take 1 tablet (10 mg total) by mouth daily before breakfast. 07/16/21   Early Osmond, MD  ?esomeprazole (NEXIUM) 40 MG capsule Take 40 mg by mouth daily. 10/10/15   [provider]  ?glimepiride (AMARYL) 1 MG tablet Take 1 mg by mouth every evening.    [provider]  ?HYDROcodone-acetaminophen (NORCO/VICODIN) 5-325 MG tablet One tablet every six hours for pain.  Limit 7 days. 08/01/21   Sanjuana Kava, MD  ?lisinopril (PRINIVIL,ZESTRIL) 20 MG tablet Take 20 mg by mouth daily. 09/28/15   [provider]  ?metFORMIN (GLUCOPHAGE) 1000 MG tablet Take 1,000 mg by mouth 2 (two) times daily.  10/10/15   [provider]  ?metoprolol succinate (TOPROL-XL) 25 MG 24 hr tablet Take 25 mg by mouth daily.    [provider]  ?Multiple Vitamin (MULTIVITAMIN WITH MINERALS) TABS tablet Take 1 tablet by mouth daily.    [provider]  ?pioglitazone (ACTOS) 15 MG tablet Take 15 mg by mouth at bedtime.  09/21/15   [provider]  ?   ? ?Allergies    ?Patient has no known allergies.   ? ?Review of Systems   ?Review of Systems  ?Constitutional:  Negative for appetite change and fatigue.  ?HENT:  Negative for congestion, ear discharge and sinus pressure.   ?Eyes:  Negative for discharge.  ?Respiratory:  Negative for cough.   ?Cardiovascular:  Positive for palpitations. Negative for chest pain.  ?Gastrointestinal:  Negative for abdominal pain and diarrhea.  ?Genitourinary:  Negative for frequency and hematuria.  ?Musculoskeletal:  Negative for back pain.  ?Skin:  Negative for rash.  ?Neurological:  Negative for seizures and headaches.  ?Psychiatric/Behavioral:  Negative for hallucinations.   ? ?Physical Exam ?Updated Vital Signs ?BP (!) 133/98   Pulse (!) 55   Temp 97.9 ?F (36.6 ?C) (Oral)   Resp 16   Ht '5\' 11"'$  (1.803 m)   Wt 91.6 kg   SpO2 100%   BMI 28.17 kg/m?  ?Physical Exam ?Vitals and nursing note reviewed.  ?Constitutional:   ?   Appearance: He is well-developed.  ?HENT:  ?  Head: Normocephalic.  ?   Nose: Nose normal.  ?Eyes:  ?   General: No scleral icterus. ?   Conjunctiva/sclera: Conjunctivae normal.  ?Neck:  ?   Thyroid: No thyromegaly.  ?Cardiovascular:  ?   Rate and Rhythm: Tachycardia present. Rhythm irregular.  ?   Heart sounds: No murmur heard. ?  No friction rub. No gallop.  ?Pulmonary:  ?   Breath sounds: No stridor. No wheezing or rales.  ?Chest:  ?   Chest wall: No tenderness.  ?Abdominal:  ?   General: There is no distension.  ?   Tenderness: There is no abdominal tenderness. There is no rebound.  ?Musculoskeletal:     ?   General: Normal range of motion.   ?   Cervical back: Neck supple.  ?Lymphadenopathy:  ?   Cervical: No cervical adenopathy.  ?Skin: ?   Findings: No erythema or rash.  ?Neurological:  ?   Mental Status: He is alert and oriented to person, place, and time.  ?   Motor: No abnormal muscle tone.  ?   Coordination: Coordination normal.  ?Psychiatric:     ?   Behavior: Behavior normal.  ? ? ?ED Results / Procedures / Treatments   ?Labs ?(all labs ordered are listed, but only abnormal results are displayed) ?Labs Reviewed  ?BASIC METABOLIC PANEL - Abnormal; Notable for the following components:  ?    Result Value  ? Glucose, Bld 151 (*)   ? BUN 26 (*)   ? All other components within normal limits  ?CBC  ?TROPONIN I (HIGH SENSITIVITY)  ?TROPONIN I (HIGH SENSITIVITY)  ? ? ?EKG ?None ? ?Radiology ?DG Chest 2 View ? ?Result Date: 08/02/2021 ?CLINICAL DATA:  New onset AFib.  Chest pain. EXAM: CHEST - 2 VIEW COMPARISON:  06/28/2021 FINDINGS: Lungs are hyperexpanded. The lungs are clear without focal pneumonia, edema, pneumothorax or pleural effusion. Interstitial markings are diffusely coarsened with chronic features. The cardiopericardial silhouette is within normal limits for size. Telemetry leads overlie the chest. Old left rib fractures noted. IMPRESSION: Hyperexpansion without acute cardiopulmonary findings. Electronically Signed   By: Misty Stanley M.D.   On: 08/02/2021 07:36   ? ?Procedures ?Procedures  ? ? ?Medications Ordered in ED ?Medications  ?diltiazem (CARDIZEM) 125 mg in dextrose 5% 125 mL (1 mg/mL) infusion (12.5 mg/hr Intravenous Rate/Dose Change 08/02/21 0849)  ?diltiazem (CARDIZEM) injection 10 mg (10 mg Intravenous Given 08/02/21 0735)  ?sodium chloride 0.9 % bolus 500 mL (0 mLs Intravenous Stopped 08/02/21 0849)  ? ? ?ED Course/ Medical Decision Making/ A&P ? CRITICAL CARE ?Performed by: Milton Ferguson ?Total critical care time: 40 minutes ?Critical care time was exclusive of separately billable procedures and treating other patients. ?Critical  care was necessary to treat or prevent imminent or life-threatening deterioration. ?Critical care was time spent personally by me on the following activities: development of treatment plan with patient and/or surrogate as well as nursing, discussions with consultants, evaluation of patient's response to treatment, examination of patient, obtaining history from patient or surrogate, ordering and performing treatments and interventions, ordering and review of laboratory studies, ordering and review of radiographic studies, pulse oximetry and re-evaluation of patient's condition. ? ?                        ?Medical Decision Making ?Amount and/or Complexity of Data Reviewed ?Labs: ordered. ?Radiology: ordered. ? ?Risk ?Prescription drug management. ?Decision regarding hospitalization. ? ?This patient presents to the ED for  concern of palpitation, this involves an extensive number of treatment options, and is a complaint that carries with it a high risk of complications and morbidity.  The differential diagnosis includes rapid A-fib, PVCs ? ? ?Co morbidities that complicate the patient evaluation ? ?Coronary artery disease. atrial fibs.  Also hypertension ? ? ?Additional history obtained: ? ?Additional history obtained from patient ?External records from outside source obtained and reviewed including hospital record ? ? ?Lab Tests: ? ?I Ordered, and personally interpreted labs.  The pertinent results include: CBC and chemistries that showed mild elevated glucose at one-point ? ? ?Imaging Studies ordered: ? ?I ordered imaging studies including chest x-ray ?I independently visualized and interpreted imaging which showed unremarkable ?I agree with the radiologist interpretation ? ? ?Cardiac Monitoring: / EKG: ? ?The patient was maintained on a cardiac monitor.  I personally viewed and interpreted the cardiac monitored which showed an underlying rhythm of: Rapid atrial fibs with some PVCs ? ? ?Consultations Obtained: ? ?I  requested consultation with the cardiology and hospital,  and discussed lab and imaging findings as well as pertinent plan - they recommend: Admission and continue Cardizem IV ? ? ?Problem List / ED Course / Crit

## 2021-08-02 NOTE — Assessment & Plan Note (Addendum)
--  secondary to uncontrolled atrial fibrillation with RVR ?--HR is now controlled, he is on toprolXL 50 mg BID  ?

## 2021-08-02 NOTE — Assessment & Plan Note (Addendum)
--  appreciate cardiology consultation and recommendations ?--Pt now OFF IV dilitiazem and on Toprol XL 50 mg BID  ?--follow up TSH : 0.481 ?--DC home 4/2 with follow up with cardiology outpatient ?

## 2021-08-02 NOTE — ED Notes (Signed)
Patient returned from X-ray 

## 2021-08-02 NOTE — Plan of Care (Signed)
Patient admitted with a medical diagnosis of "atrial fibrillation with RVR". Patient has since been weaned off of diltiazem infusion. No O2 requirement. Denies SOB, CP, palpitations, and pain. No obvious distress. ? ? ?Problem: Education: ?Goal: Knowledge of General Education information will improve ?Description: Including pain rating scale, medication(s)/side effects and non-pharmacologic comfort measures ?Outcome: Progressing ?  ?Problem: Health Behavior/Discharge Planning: ?Goal: Ability to manage health-related needs will improve ?Outcome: Progressing ?  ?Problem: Clinical Measurements: ?Goal: Ability to maintain clinical measurements within normal limits will improve ?Outcome: Progressing ?Goal: Will remain free from infection ?Outcome: Progressing ?Goal: Diagnostic test results will improve ?Outcome: Progressing ?Goal: Respiratory complications will improve ?Outcome: Progressing ?Goal: Cardiovascular complication will be avoided ?Outcome: Progressing ?  ?Problem: Activity: ?Goal: Risk for activity intolerance will decrease ?Outcome: Progressing ?  ?Problem: Nutrition: ?Goal: Adequate nutrition will be maintained ?Outcome: Progressing ?  ?Problem: Coping: ?Goal: Level of anxiety will decrease ?Outcome: Progressing ?  ?Problem: Elimination: ?Goal: Will not experience complications related to bowel motility ?Outcome: Progressing ?Goal: Will not experience complications related to urinary retention ?Outcome: Progressing ?  ?Problem: Pain Managment: ?Goal: General experience of comfort will improve ?Outcome: Progressing ?  ?Problem: Safety: ?Goal: Ability to remain free from injury will improve ?Outcome: Progressing ?  ?Problem: Skin Integrity: ?Goal: Risk for impaired skin integrity will decrease ?Outcome: Progressing ?  ? ?

## 2021-08-02 NOTE — Assessment & Plan Note (Addendum)
--  holding pioglitazone esp in setting of reduced EF ?--monitor for bleeding complications while on apixaban  ?

## 2021-08-02 NOTE — Assessment & Plan Note (Addendum)
--  continue Jardiance 10 mg daily ?--holding pioglitazone ?--CBG monitoring and SSI coverage ordered ?--no concentrated sweets ? ?CBG (last 3)  ?Recent Labs  ?  08/03/21 ?1607 08/03/21 ?2215 08/04/21 ?7782  ?GLUCAP 195* 183* 163*  ? ? ?

## 2021-08-02 NOTE — Assessment & Plan Note (Addendum)
--  BPs have rebounded, added lisinopril 10 mg on 4/1 ?

## 2021-08-02 NOTE — Assessment & Plan Note (Addendum)
--  LVEF 45-50% on recent TTE ?--he was recently started on Jardiance 10 mg daily by cardiology outpatient ?--lisinopril 10 mg daily started ?--holding pioglitazone especially in setting of reduced EF  ?

## 2021-08-03 ENCOUNTER — Inpatient Hospital Stay (HOSPITAL_COMMUNITY): Payer: BC Managed Care – PPO

## 2021-08-03 DIAGNOSIS — J439 Emphysema, unspecified: Secondary | ICD-10-CM | POA: Diagnosis not present

## 2021-08-03 DIAGNOSIS — I110 Hypertensive heart disease with heart failure: Secondary | ICD-10-CM | POA: Diagnosis not present

## 2021-08-03 DIAGNOSIS — I10 Essential (primary) hypertension: Secondary | ICD-10-CM | POA: Diagnosis not present

## 2021-08-03 DIAGNOSIS — K5730 Diverticulosis of large intestine without perforation or abscess without bleeding: Secondary | ICD-10-CM | POA: Diagnosis not present

## 2021-08-03 DIAGNOSIS — E785 Hyperlipidemia, unspecified: Secondary | ICD-10-CM | POA: Diagnosis not present

## 2021-08-03 DIAGNOSIS — Z79899 Other long term (current) drug therapy: Secondary | ICD-10-CM | POA: Diagnosis not present

## 2021-08-03 DIAGNOSIS — I4891 Unspecified atrial fibrillation: Secondary | ICD-10-CM | POA: Diagnosis not present

## 2021-08-03 DIAGNOSIS — Z7901 Long term (current) use of anticoagulants: Secondary | ICD-10-CM | POA: Diagnosis not present

## 2021-08-03 DIAGNOSIS — I509 Heart failure, unspecified: Secondary | ICD-10-CM | POA: Diagnosis not present

## 2021-08-03 DIAGNOSIS — K6389 Other specified diseases of intestine: Secondary | ICD-10-CM | POA: Diagnosis not present

## 2021-08-03 DIAGNOSIS — I2510 Atherosclerotic heart disease of native coronary artery without angina pectoris: Secondary | ICD-10-CM | POA: Diagnosis not present

## 2021-08-03 DIAGNOSIS — E1169 Type 2 diabetes mellitus with other specified complication: Secondary | ICD-10-CM | POA: Diagnosis not present

## 2021-08-03 DIAGNOSIS — R590 Localized enlarged lymph nodes: Secondary | ICD-10-CM | POA: Diagnosis not present

## 2021-08-03 DIAGNOSIS — I5021 Acute systolic (congestive) heart failure: Secondary | ICD-10-CM | POA: Diagnosis not present

## 2021-08-03 DIAGNOSIS — R002 Palpitations: Secondary | ICD-10-CM | POA: Diagnosis not present

## 2021-08-03 DIAGNOSIS — Z7982 Long term (current) use of aspirin: Secondary | ICD-10-CM | POA: Diagnosis not present

## 2021-08-03 LAB — BASIC METABOLIC PANEL
Anion gap: 5 (ref 5–15)
BUN: 23 mg/dL (ref 8–23)
CO2: 21 mmol/L — ABNORMAL LOW (ref 22–32)
Calcium: 8.5 mg/dL — ABNORMAL LOW (ref 8.9–10.3)
Chloride: 111 mmol/L (ref 98–111)
Creatinine, Ser: 0.95 mg/dL (ref 0.61–1.24)
GFR, Estimated: >60 mL/min (ref >60)
Glucose, Bld: 130 mg/dL — ABNORMAL HIGH (ref 70–99)
Potassium: 4.1 mmol/L (ref 3.5–5.1)
Sodium: 137 mmol/L (ref 135–145)

## 2021-08-03 LAB — GLUCOSE, CAPILLARY
Glucose-Capillary: 163 mg/dL — ABNORMAL HIGH (ref 70–99)
Glucose-Capillary: 112 mg/dL — ABNORMAL HIGH (ref 70–99)
Glucose-Capillary: 195 mg/dL — ABNORMAL HIGH (ref 70–99)
Glucose-Capillary: 183 mg/dL — ABNORMAL HIGH (ref 70–99)

## 2021-08-03 LAB — MAGNESIUM: Magnesium: 2.3 mg/dL (ref 1.7–2.4)

## 2021-08-03 MED ORDER — IOHEXOL 9 MG/ML PO SOLN
500.0000 mL | ORAL | Status: AC
  Administered 2021-08-03 (×2): 500 mL via ORAL

## 2021-08-03 MED ORDER — IOHEXOL 300 MG/ML  SOLN
100.0000 mL | Freq: Once | INTRAMUSCULAR | Status: AC | PRN
  Administered 2021-08-03: 100 mL via INTRAVENOUS

## 2021-08-03 MED ORDER — METOPROLOL SUCCINATE ER 50 MG PO TB24
50.0000 mg | ORAL_TABLET | Freq: Two times a day (BID) | ORAL | Status: DC
  Administered 2021-08-03 – 2021-08-04 (×3): 50 mg via ORAL
  Filled 2021-08-03 (×3): qty 1

## 2021-08-03 MED ORDER — LISINOPRIL 10 MG PO TABS
10.0000 mg | ORAL_TABLET | Freq: Every day | ORAL | Status: DC
  Administered 2021-08-03 – 2021-08-04 (×2): 10 mg via ORAL
  Filled 2021-08-03 (×2): qty 1

## 2021-08-03 NOTE — Progress Notes (Signed)
?PROGRESS NOTE ? ? ?Trevor Steele  PRF:[id number] DOB: [birth year] DOA: 08/02/2021 ?PCP: [name], MD  ? ?Chief Complaint  ?Patient presents with  ? Palpitations  ? ?Level of care: Telemetry ? ?Brief Admission History:  ?61 year old gentleman with recently diagnosed paroxysmal atrial fibrillation, type 2 diabetes mellitus, hypertension, tobacco use, hyperlipidemia, coronary artery calcifications, pulmonary nodules presented to the emergency department with recurrent palpitations.  He has had several recent ED visits associated with the diagnosis of his paroxysmal atrial fibrillation.  He has been on Cardizem that was subsequently discontinued and apixaban and has been maintained on Toprol-XL 25 mg twice daily.  He is followed by Dr. Ali Lowe and had a recent TTE with findings of EF 45-50% and grade 1 DD, mildly reduced RV function and mild to moderate aortic valve stenosis.   ? ?He started having palpitations this morning and his smart watch reported elevated heart rates.  He has been fully compliant with his toprol and apixaban.  He has cut back on cigarette smoking.  He has been physically active at baseline.  He denies having chest pain symptoms.  His EKG shows Afib with RVR HR 120-130.  He was started on IV cardizem infusion and cardiology was consulted who requested admission to hospital.  Pt also reported that he recently saw his PCP and was scheduled for an outpatient CT soft tissue neck for a newly discovered left neck mass.  ? ?08/03/2021: Pt tolerated higher dose metoprolol overnight.  He remains rate controlled. Will start on toprol XL 50 mg BID and transfer to telemetry bed. If HR can remain controlled on this regimen likely can discharge home 4/2.  Wife updated at bedside. Pt noted to have possible metastatic lesion on lymph node from neck CT.  Will get CT chest/abd pelv to look for primary malignancy.  ?  ?Assessment and Plan: ?* Atrial fibrillation with RVR ([place]) ?--appreciate cardiology consultation  and recommendations ?--Pt now OFF IV dilitiazem and on Toprol XL 50 mg BID  ?--follow up TSH : 0.481 ?--if stable, plan to DC home 4/2 with follow up with cardiology outpatient ? ?HFmrEF ?--LVEF 45-50% on recent TTE ?--he was recently started on Jardiance 10 mg daily by cardiology outpatient ?--lisinopril 10 mg daily started ?--holding pioglitazone especially in setting of reduced EF  ? ?Intermittent heart palpitations ?--secondary to uncontrolled atrial fibrillation with RVR ?--HR is now controlled, he is on toprolXL 50 mg BID  ? ?Anticoagulated (apixaban) ?--continue apixaban 5 mg BID for full anticoagulation ? ?High risk medication use (pioglitazone) ?--holding pioglitazone esp in setting of reduced EF ?--monitor for bleeding complications while on apixaban  ? ?Hyperlipidemia associated with type 2 diabetes mellitus ([place]) ?--resume home atorvastatin 40 mg daily  ? ?Essential hypertension ?--BPs have rebounded, added lisinopril 10 mg on 4/1 ? ?Type 2 diabetes mellitus ([place]) ?--continue Jardiance 10 mg daily ?--holding pioglitazone ?--CBG monitoring and SSI coverage ordered ?--no concentrated sweets ? ?CBG (last 3)  ?Recent Labs  ?  08/02/21 ?2128 08/03/21 ?0728 08/03/21 ?1207  ?GLUCAP 149* 163* 112*  ? ? ? ?Mass of left side of neck ?--pt was scheduled outpatient to have a CT soft tissue neck ?--CT suspicious for metastatic lesion in lymph node.  ?--check CT chest/abd/pelvis to check for a primary malignancy ? ?Tobacco use ?--strongly advised to stop all tobacco use ?--Pt is cutting down to 0.5ppd ?--will offer nicotine patch while in hospital  ? ?DVT prophylaxis: apixaban ?Code Status: Full  ?Family Communication: wife at bedside ?Disposition: Status is:  Inpatient ?Remains inpatient appropriate because: transitioning off IV infusion to new oral regimen for heart rate control ?  ?Consultants:  ?Cardiology  ?Procedures:  ? ?Antimicrobials:  ?  ?Subjective: ?Pt had some mild headache but no CP and no palpitations.   ?Objective: ?Vitals:  ? 08/03/21 0755 08/03/21 0800 08/03/21 0905 08/03/21 1000  ?BP: (!) 157/107 (!) 159/130  (!) 136/99  ?Pulse: 84 (!) 50 73 75  ?Resp: '13 20  20  '$ ?Temp: (!) 97.4 ?F (36.3 ?C)   (!) 97.5 ?F (36.4 ?C)  ?TempSrc: Oral   Axillary  ?SpO2: 98% 99%  100%  ?Weight:      ?Height:      ? ? ?Intake/Output Summary (Last 24 hours) at 08/03/2021 1247 ?Last data filed at 08/02/2021 1900 ?Gross per 24 hour  ?Intake 197.12 ml  ?Output --  ?Net 197.12 ml  ? ?Filed Weights  ? 08/02/21 0706 08/02/21 1133 08/03/21 0427  ?Weight: 91.6 kg 91.3 kg 90.3 kg  ? ?Examination: ? ?General exam: Appears calm and comfortable  ?Respiratory system: Clear to auscultation. Respiratory effort normal. ?Cardiovascular system: normal S1 & S2 heard. No JVD, murmurs, rubs, gallops or clicks. No pedal edema. ?Gastrointestinal system: Abdomen is nondistended, soft and nontender. No organomegaly or masses felt. Normal bowel sounds heard. ?Central nervous system: Alert and oriented. No focal neurological deficits. ?Extremities: Symmetric 5 x 5 power. ?Skin: No rashes, lesions or ulcers. ?Psychiatry: Judgement and insight appear normal. Mood & affect appropriate.  ? ?Data Reviewed: I have personally reviewed following labs and imaging studies ? ?CBC: ?Recent Labs  ?Lab 08/02/21 ?0715  ?WBC 10.5  ?HGB 15.1  ?HCT 44.9  ?MCV 92.4  ?PLT 241  ? ? ?Basic Metabolic Panel: ?Recent Labs  ?Lab 08/02/21 ?0715 08/03/21 ?0411  ?NA 137 137  ?K 3.7 4.1  ?CL 106 111  ?CO2 22 21*  ?GLUCOSE 151* 130*  ?BUN 26* 23  ?CREATININE 1.04 0.95  ?CALCIUM 9.2 8.5*  ?MG 1.8 2.3  ? ? ?CBG: ?Recent Labs  ?Lab 08/02/21 ?1602 08/02/21 ?2128 08/03/21 ?1610 08/03/21 ?1207  ?GLUCAP 208* 149* 163* 112*  ? ? ?Recent Results (from the past 240 hour(s))  ?MRSA Next Gen by PCR, Nasal     Status: None  ? Collection Time: 08/02/21 11:27 AM  ? Specimen: Nasal Mucosa; Nasal Swab  ?Result Value Ref Range Status  ? MRSA by PCR Next Gen NOT DETECTED NOT DETECTED Final  ?  Comment:  (NOTE) ?The GeneXpert MRSA Assay (FDA approved for NASAL specimens only), ?is one component of a comprehensive MRSA colonization surveillance ?program. It is not intended to diagnose MRSA infection nor to guide ?or monitor treatment for MRSA infections. ?Test performance is not FDA approved in patients less than 2 years ?old. ?Performed at [facility], [address] [place], [place] [address] ?  ?  ? ?Radiology Studies: ?DG Chest 2 View ? ?Result Date: 08/02/2021 ?CLINICAL DATA:  New onset AFib.  Chest pain. EXAM: CHEST - 2 VIEW COMPARISON:  06/28/2021 FINDINGS: Lungs are hyperexpanded. The lungs are clear without focal pneumonia, edema, pneumothorax or pleural effusion. Interstitial markings are diffusely coarsened with chronic features. The cardiopericardial silhouette is within normal limits for size. Telemetry leads overlie the chest. Old left rib fractures noted. IMPRESSION: Hyperexpansion without acute cardiopulmonary findings. Electronically Signed   By: Misty Stanley M.D.   On: 08/02/2021 07:36  ? ?CT SOFT TISSUE NECK W CONTRAST ? ?Result Date: 08/02/2021 ?CLINICAL DATA:  Left mandibular submandibular neck mass or swelling.  EXAM: CT NECK WITH CONTRAST TECHNIQUE: Multidetector CT imaging of the neck was performed using the standard protocol following the bolus administration of intravenous contrast. RADIATION DOSE REDUCTION: This exam was performed according to the departmental dose-optimization program which includes automated exposure control, adjustment of the mA and/or kV according to patient size and/or use of iterative reconstruction technique. CONTRAST:  18m OMNIPAQUE IOHEXOL 300 MG/ML  SOLN COMPARISON:  None. FINDINGS: Pharynx and larynx: Symmetric prominence of the lingual tonsils. No mass or swelling. Salivary glands: No inflammation, mass, or obstructing stone. A calcification is noted in the posterior left parotid gland. Thyroid: Calcification within the left thyroid lobe, which appears similar  to the 12/04/2020 CT. Otherwise negative. Lymph nodes: Enlarged left level 2A lymph nodes with central low density, favored to represent 3 lymph nodes in conglomerate, which measure up to 1.6 x 1.5 x 1.3 cm (seri

## 2021-08-04 DIAGNOSIS — I5021 Acute systolic (congestive) heart failure: Secondary | ICD-10-CM | POA: Diagnosis not present

## 2021-08-04 DIAGNOSIS — R002 Palpitations: Secondary | ICD-10-CM | POA: Diagnosis not present

## 2021-08-04 DIAGNOSIS — E785 Hyperlipidemia, unspecified: Secondary | ICD-10-CM | POA: Diagnosis not present

## 2021-08-04 DIAGNOSIS — E1169 Type 2 diabetes mellitus with other specified complication: Secondary | ICD-10-CM | POA: Diagnosis not present

## 2021-08-04 DIAGNOSIS — I10 Essential (primary) hypertension: Secondary | ICD-10-CM | POA: Diagnosis not present

## 2021-08-04 DIAGNOSIS — I110 Hypertensive heart disease with heart failure: Secondary | ICD-10-CM | POA: Diagnosis not present

## 2021-08-04 DIAGNOSIS — I509 Heart failure, unspecified: Secondary | ICD-10-CM | POA: Diagnosis not present

## 2021-08-04 DIAGNOSIS — Z7982 Long term (current) use of aspirin: Secondary | ICD-10-CM | POA: Diagnosis not present

## 2021-08-04 DIAGNOSIS — I4891 Unspecified atrial fibrillation: Secondary | ICD-10-CM | POA: Diagnosis not present

## 2021-08-04 DIAGNOSIS — Z7901 Long term (current) use of anticoagulants: Secondary | ICD-10-CM | POA: Diagnosis not present

## 2021-08-04 DIAGNOSIS — Z79899 Other long term (current) drug therapy: Secondary | ICD-10-CM | POA: Diagnosis not present

## 2021-08-04 LAB — BASIC METABOLIC PANEL
Anion gap: 5 (ref 5–15)
BUN: 23 mg/dL (ref 8–23)
CO2: 24 mmol/L (ref 22–32)
Calcium: 8.8 mg/dL — ABNORMAL LOW (ref 8.9–10.3)
Chloride: 110 mmol/L (ref 98–111)
Creatinine, Ser: 0.99 mg/dL (ref 0.61–1.24)
GFR, Estimated: >60 mL/min (ref >60)
Glucose, Bld: 152 mg/dL — ABNORMAL HIGH (ref 70–99)
Potassium: 4 mmol/L (ref 3.5–5.1)
Sodium: 139 mmol/L (ref 135–145)

## 2021-08-04 LAB — MAGNESIUM: Magnesium: 2.2 mg/dL (ref 1.7–2.4)

## 2021-08-04 LAB — GLUCOSE, CAPILLARY: Glucose-Capillary: 163 mg/dL — ABNORMAL HIGH (ref 70–99)

## 2021-08-04 MED ORDER — METOPROLOL SUCCINATE ER 50 MG PO TB24: 50.0000 mg | ORAL_TABLET | Freq: Two times a day (BID) | ORAL | 1 refills | Status: DC

## 2021-08-04 MED ORDER — LISINOPRIL 10 MG PO TABS
10.0000 mg | ORAL_TABLET | Freq: Every day | ORAL | 1 refills | Status: AC
Start: 2021-08-04 — End: ?

## 2021-08-04 NOTE — Discharge Instructions (Signed)
IMPORTANT INFORMATION: PAY CLOSE ATTENTION   PHYSICIAN DISCHARGE INSTRUCTIONS  Follow with Primary care provider  Fagan, Roy, MD  and other consultants as instructed by your Hospitalist Physician  SEEK MEDICAL CARE OR RETURN TO EMERGENCY ROOM IF SYMPTOMS COME BACK, WORSEN OR NEW PROBLEM DEVELOPS   Please note: You were cared for by a hospitalist during your hospital stay. Every effort will be made to forward records to your primary care provider.  You can request that your primary care provider send for your hospital records if they have not received them.  Once you are discharged, your primary care physician will handle any further medical issues. Please note that NO REFILLS for any discharge medications will be authorized once you are discharged, as it is imperative that you return to your primary care physician (or establish a relationship with a primary care physician if you do not have one) for your post hospital discharge needs so that they can reassess your need for medications and monitor your lab values.  Please get a complete blood count and chemistry panel checked by your Primary MD at your next visit, and again as instructed by your Primary MD.  Get Medicines reviewed and adjusted: Please take all your medications with you for your next visit with your Primary MD  Laboratory/radiological data: Please request your Primary MD to go over all hospital tests and procedure/radiological results at the follow up, please ask your primary care provider to get all Hospital records sent to his/her office.  In some cases, they will be blood work, cultures and biopsy results pending at the time of your discharge. Please request that your primary care provider follow up on these results.  If you are diabetic, please bring your blood sugar readings with you to your follow up appointment with primary care.    Please call and make your follow up appointments as soon as possible.    Also Note the  following: If you experience worsening of your admission symptoms, develop shortness of breath, life threatening emergency, suicidal or homicidal thoughts you must seek medical attention immediately by calling 911 or calling your MD immediately  if symptoms less severe.  You must read complete instructions/literature along with all the possible adverse reactions/side effects for all the Medicines you take and that have been prescribed to you. Take any new Medicines after you have completely understood and accpet all the possible adverse reactions/side effects.   Do not drive when taking Pain medications or sleeping medications (Benzodiazepines)  Do not take more than prescribed Pain, Sleep and Anxiety Medications. It is not advisable to combine anxiety,sleep and pain medications without talking with your primary care practitioner  Special Instructions: If you have smoked or chewed Tobacco  in the last 2 yrs please stop smoking, stop any regular Alcohol  and or any Recreational drug use.  Wear Seat belts while driving.  Do not drive if taking any narcotic, mind altering or controlled substances or recreational drugs or alcohol.       

## 2021-08-04 NOTE — Discharge Summary (Signed)
Physician Discharge Summary  ?HANNA AULTMAN NOB:[id number] DOB: [birth year] DOA: 08/02/2021 ? ?PCP: [name], MD ?Cardiology: Karalee Height ? ?Admit date: 08/02/2021 ?Discharge date: 08/04/2021 ? ?Admitted From:  Home  ?Disposition: Home  ? ?Recommendations for Outpatient Follow-up:  ?Follow up with PCP in 1 weeks ?Follow up with ENT as soon as possible (ambulatory referral made) ?Please follow up with cardiology as scheduled  ? ?Discharge Condition: STABLE   ?CODE STATUS: FULL  ?DIET: resume prior home diet   ? ?Brief Hospitalization Summary: ?Please see all hospital notes, images, labs for full details of the hospitalization. ?61 year old gentleman with recently diagnosed paroxysmal atrial fibrillation, type 2 diabetes mellitus, hypertension, tobacco use, hyperlipidemia, coronary artery calcifications, pulmonary nodules presented to the emergency department with recurrent palpitations.  He has had several recent ED visits associated with the diagnosis of his paroxysmal atrial fibrillation.  He has been on Cardizem that was subsequently discontinued and apixaban and has been maintained on Toprol-XL 25 mg twice daily.  He is followed by Dr. Ali Lowe and had a recent TTE with findings of EF 45-50% and grade 1 DD, mildly reduced RV function and mild to moderate aortic valve stenosis.   ? ?He started having palpitations this morning and his smart watch reported elevated heart rates.  He has been fully compliant with his toprol and apixaban.  He has cut back on cigarette smoking.  He has been physically active at baseline.  He denies having chest pain symptoms.  His EKG shows Afib with RVR HR 120-130.  He was started on IV cardizem infusion and cardiology was consulted who requested admission to hospital.  Pt also reported that he recently saw his PCP and was scheduled for an outpatient CT soft tissue neck for a newly discovered left neck mass.  ? ?08/03/2021: Pt tolerated higher dose metoprolol overnight.  He  remains rate controlled. Will start on toprol XL 50 mg BID and transfer to telemetry bed. If HR can remain controlled on this regimen likely can discharge home 4/2.  Wife updated at bedside. Pt noted to have possible metastatic lesion on lymph node from neck CT.  Will get CT chest/abd pelv to look for primary malignancy.  ? ?08/04/2021:  HR has remained well controlled on toprol XL 50 mg BID.  DC home.  Follow up with cardiology as scheduled.  CT scan chest/abd/pelvis did not show any findings of occult malignancy. Ambulatory referral to ENT made for further evaluation of neck lesions.  ? ?HOSPITAL COURSE BY PROBLEM LIST ? ?Assessment and Plan: ?* Atrial fibrillation with RVR ([place]) ?--appreciate cardiology consultation and recommendations ?--Pt now OFF IV dilitiazem and on Toprol XL 50 mg BID  ?--follow up TSH : 0.481 ?--DC home 4/2 with follow up with cardiology outpatient ? ?HFmrEF ?--LVEF 45-50% on recent TTE ?--he was recently started on Jardiance 10 mg daily by cardiology outpatient ?--lisinopril 10 mg daily started ?--holding pioglitazone especially in setting of reduced EF  ? ?Intermittent heart palpitations ?--secondary to uncontrolled atrial fibrillation with RVR ?--HR is now controlled, he is on toprolXL 50 mg BID  ? ?Anticoagulated (apixaban) ?--continue apixaban 5 mg BID for full anticoagulation ? ?High risk medication use (pioglitazone) ?--holding pioglitazone esp in setting of reduced EF ?--monitor for bleeding complications while on apixaban  ? ?Hyperlipidemia associated with type 2 diabetes mellitus ([place]) ?--resume home atorvastatin 40 mg daily  ? ?Essential hypertension ?--BPs have rebounded, added lisinopril 10 mg on 4/1 ? ?Type 2 diabetes mellitus ([place]) ?--continue Jardiance 10 mg  daily ?--holding pioglitazone ?--CBG monitoring and SSI coverage ordered ?--no concentrated sweets ? ?CBG (last 3)  ?Recent Labs  ?  08/03/21 ?1607 08/03/21 ?2215 08/04/21 ?3338  ?GLUCAP 195* 183* 163*  ? ? ? ?Mass of left  side of neck ?--pt was scheduled outpatient to have a CT soft tissue neck ?--CT suspicious for metastatic lesion in lymph node.  ?--CT chest/abd/pelvis did not show a primary malignancy ?--ambulatory referral to ENT made ? ?Tobacco use ?--strongly advised to stop all tobacco use ?--Pt is cutting down to 0.5ppd ?--offered nicotine patch while in hospital  ? ?Discharge Diagnoses:  ?Principal Problem: ?  Atrial fibrillation with RVR ([place]) ?Active Problems: ?  Anticoagulated (apixaban) ?  Intermittent heart palpitations ?  HFmrEF ?  Type 2 diabetes mellitus ([place]) ?  Essential hypertension ?  Hyperlipidemia associated with type 2 diabetes mellitus ([place]) ?  High risk medication use (pioglitazone) ?  Tobacco use ?  Mass of left side of neck ? ? ?Discharge Instructions: ?Discharge Instructions   ? ? Ambulatory referral to ENT   Complete by: As directed ?  ? ?  ? ?Allergies as of 08/04/2021   ?No Known Allergies ?  ? ?  ?Medication List  ?  ? ?STOP taking these medications   ? ?pioglitazone 15 MG tablet ?Commonly known as: ACTOS ?  ? ?  ? ?TAKE these medications   ? ?acetaminophen 325 MG tablet ?Commonly known as: TYLENOL ?Take 650 mg by mouth every 6 (six) hours as needed for mild pain. ?  ?apixaban 5 MG Tabs tablet ?Commonly known as: ELIQUIS ?Take 1 tablet (5 mg total) by mouth 2 (two) times daily. ?  ?atorvastatin 40 MG tablet ?Commonly known as: LIPITOR ?Take 1 tablet (40 mg total) by mouth daily. ?  ?empagliflozin 10 MG Tabs tablet ?Commonly known as: Jardiance ?Take 1 tablet (10 mg total) by mouth daily before breakfast. ?  ?esomeprazole 40 MG capsule ?Commonly known as: [place] ?Take 40 mg by mouth daily. ?  ?glimepiride 1 MG tablet ?Commonly known as: AMARYL ?Take 1 mg by mouth every evening. ?  ?HYDROcodone-acetaminophen 5-325 MG tablet ?Commonly known as: NORCO/VICODIN ?One tablet every six hours for pain.  Limit 7 days. ?What changed:  ?how much to take ?how to take this ?when to take this ?reasons to take this ?   ?lisinopril 10 MG tablet ?Commonly known as: ZESTRIL ?Take 1 tablet (10 mg total) by mouth daily. ?What changed:  ?medication strength ?how much to take ?  ?metFORMIN 1000 MG tablet ?Commonly known as: GLUCOPHAGE ?Take 1,000 mg by mouth 2 (two) times daily. ?  ?metoprolol succinate 50 MG 24 hr tablet ?Commonly known as: TOPROL-XL ?Take 1 tablet (50 mg total) by mouth in the morning and at bedtime. ?What changed:  ?medication strength ?how much to take ?  ?multivitamin with minerals Tabs tablet ?Take 1 tablet by mouth daily. ?  ? ?  ? ? Follow-up Information   ? ? [name], MD. Schedule an appointment as soon as possible for a visit in 1 week(s).   ?Specialty: Internal Medicine ?Why: Hospital Follow Up ?Contact information: ?[address] ?[place] [address] ?[phone] ? ? ?  ?  ? ? Imogene Burn, PA-C. Go to.   ?Specialty: Cardiology ?Why: scheduled appointment for follow up ?Contact information: ?[address] ?[place] [address] ?[phone] ? ? ?  ?  ? ?  ?  ? ?  ? ?No Known Allergies ?Allergies as of 08/04/2021   ?  No Known Allergies ?  ? ?  ?Medication List  ?  ? ?STOP taking these medications   ? ?pioglitazone 15 MG tablet ?Commonly known as: ACTOS ?  ? ?  ? ?TAKE these medications   ? ?acetaminophen 325 MG tablet ?Commonly known as: TYLENOL ?Take 650 mg by mouth every 6 (six) hours as needed for mild pain. ?  ?apixaban 5 MG Tabs tablet ?Commonly known as: ELIQUIS ?Take 1 tablet (5 mg total) by mouth 2 (two) times daily. ?  ?atorvastatin 40 MG tablet ?Commonly known as: LIPITOR ?Take 1 tablet (40 mg total) by mouth daily. ?  ?empagliflozin 10 MG Tabs tablet ?Commonly known as: Jardiance ?Take 1 tablet (10 mg total) by mouth daily before breakfast. ?  ?esomeprazole 40 MG capsule ?Commonly known as: [place] ?Take 40 mg by mouth daily. ?  ?glimepiride 1 MG tablet ?Commonly known as: AMARYL ?Take 1 mg by mouth every evening. ?  ?HYDROcodone-acetaminophen 5-325 MG tablet ?Commonly known as:  NORCO/VICODIN ?One tablet every six hours for pain.  Limit 7 days. ?What changed:  ?how much to take ?how to take this ?when to take this ?reasons to take this ?  ?lisinopril 10 MG tablet ?Commonly known as: ZEST

## 2021-08-07 DIAGNOSIS — R221 Localized swelling, mass and lump, neck: Secondary | ICD-10-CM | POA: Diagnosis not present

## 2021-08-07 DIAGNOSIS — I480 Paroxysmal atrial fibrillation: Secondary | ICD-10-CM | POA: Diagnosis not present

## 2021-08-12 ENCOUNTER — Emergency Department (HOSPITAL_COMMUNITY): Payer: BC Managed Care – PPO

## 2021-08-12 ENCOUNTER — Observation Stay (HOSPITAL_COMMUNITY)
Admission: EM | Admit: 2021-08-12 | Discharge: 2021-08-13 | Disposition: A | Discharge status: Home / Self Care | Payer: BC Managed Care – PPO | Attending: Internal Medicine | Admitting: Internal Medicine

## 2021-08-12 ENCOUNTER — Encounter (HOSPITAL_COMMUNITY): Payer: Self-pay | Admitting: *Deleted

## 2021-08-12 ENCOUNTER — Other Ambulatory Visit: Payer: Self-pay

## 2021-08-12 DIAGNOSIS — Z7901 Long term (current) use of anticoagulants: Secondary | ICD-10-CM | POA: Diagnosis not present

## 2021-08-12 DIAGNOSIS — R002 Palpitations: Secondary | ICD-10-CM | POA: Diagnosis not present

## 2021-08-12 DIAGNOSIS — E785 Hyperlipidemia, unspecified: Secondary | ICD-10-CM | POA: Diagnosis not present

## 2021-08-12 DIAGNOSIS — I5020 Unspecified systolic (congestive) heart failure: Secondary | ICD-10-CM | POA: Insufficient documentation

## 2021-08-12 DIAGNOSIS — I4891 Unspecified atrial fibrillation: Secondary | ICD-10-CM | POA: Diagnosis not present

## 2021-08-12 DIAGNOSIS — Z79899 Other long term (current) drug therapy: Secondary | ICD-10-CM | POA: Insufficient documentation

## 2021-08-12 DIAGNOSIS — Z87891 Personal history of nicotine dependence: Secondary | ICD-10-CM | POA: Insufficient documentation

## 2021-08-12 DIAGNOSIS — I5021 Acute systolic (congestive) heart failure: Secondary | ICD-10-CM | POA: Diagnosis present

## 2021-08-12 DIAGNOSIS — E119 Type 2 diabetes mellitus without complications: Secondary | ICD-10-CM

## 2021-08-12 DIAGNOSIS — Z7984 Long term (current) use of oral hypoglycemic drugs: Secondary | ICD-10-CM | POA: Insufficient documentation

## 2021-08-12 DIAGNOSIS — I110 Hypertensive heart disease with heart failure: Secondary | ICD-10-CM | POA: Insufficient documentation

## 2021-08-12 DIAGNOSIS — J9811 Atelectasis: Secondary | ICD-10-CM | POA: Diagnosis not present

## 2021-08-12 DIAGNOSIS — I10 Essential (primary) hypertension: Secondary | ICD-10-CM | POA: Diagnosis present

## 2021-08-12 DIAGNOSIS — E1169 Type 2 diabetes mellitus with other specified complication: Secondary | ICD-10-CM | POA: Diagnosis not present

## 2021-08-12 HISTORY — DX: Paroxysmal atrial fibrillation: I48.0

## 2021-08-12 HISTORY — DX: Localized swelling, mass and lump, neck: R22.1

## 2021-08-12 HISTORY — DX: Other nonspecific abnormal finding of lung field: R91.8

## 2021-08-12 HISTORY — DX: Nonrheumatic aortic (valve) stenosis: I35.0

## 2021-08-12 HISTORY — DX: Atherosclerotic heart disease of native coronary artery without angina pectoris: I25.10

## 2021-08-12 HISTORY — DX: Nonrheumatic mitral (valve) insufficiency: I34.0

## 2021-08-12 HISTORY — DX: Ventricular premature depolarization: I49.3

## 2021-08-12 HISTORY — DX: Thoracic aortic ectasia: I77.810

## 2021-08-12 HISTORY — DX: Atherosclerosis of aorta: I70.0

## 2021-08-12 LAB — COMPREHENSIVE METABOLIC PANEL
ALT: 29 U/L (ref 0–44)
AST: 22 U/L (ref 15–41)
Albumin: 3.7 g/dL (ref 3.5–5.0)
Alkaline Phosphatase: 80 U/L (ref 38–126)
Anion gap: 7 (ref 5–15)
BUN: 22 mg/dL (ref 8–23)
CO2: 20 mmol/L — ABNORMAL LOW (ref 22–32)
Calcium: 8.9 mg/dL (ref 8.9–10.3)
Chloride: 111 mmol/L (ref 98–111)
Creatinine, Ser: 1.13 mg/dL (ref 0.61–1.24)
GFR, Estimated: >60 mL/min (ref >60)
Glucose, Bld: 111 mg/dL — ABNORMAL HIGH (ref 70–99)
Potassium: 4 mmol/L (ref 3.5–5.1)
Sodium: 138 mmol/L (ref 135–145)
Total Bilirubin: 0.3 mg/dL (ref 0.3–1.2)
Total Protein: 6.9 g/dL (ref 6.5–8.1)

## 2021-08-12 LAB — CBC WITH DIFFERENTIAL/PLATELET
Abs Immature Granulocytes: 0.03 10*3/uL (ref 0.00–0.07)
Basophils Absolute: 0 10*3/uL (ref 0.0–0.1)
Basophils Relative: 1 %
Eosinophils Absolute: 0.6 10*3/uL — ABNORMAL HIGH (ref 0.0–0.5)
Eosinophils Relative: 7 %
HCT: 40.2 % (ref 39.0–52.0)
Hemoglobin: 13.4 g/dL (ref 13.0–17.0)
Immature Granulocytes: 0 %
Lymphocytes Relative: 31 %
Lymphs Abs: 2.7 10*3/uL (ref 0.7–4.0)
MCH: 30.2 pg (ref 26.0–34.0)
MCHC: 33.3 g/dL (ref 30.0–36.0)
MCV: 90.7 fL (ref 80.0–100.0)
Monocytes Absolute: 0.8 10*3/uL (ref 0.1–1.0)
Monocytes Relative: 9 %
Neutro Abs: 4.4 10*3/uL (ref 1.7–7.7)
Neutrophils Relative %: 52 %
Platelets: 212 10*3/uL (ref 150–400)
RBC: 4.43 MIL/uL (ref 4.22–5.81)
RDW: 13.2 % (ref 11.5–15.5)
WBC: 8.6 10*3/uL (ref 4.0–10.5)
nRBC: 0 % (ref 0.0–0.2)

## 2021-08-12 LAB — TROPONIN I (HIGH SENSITIVITY)
Troponin I (High Sensitivity): 5 ng/L (ref <18)
Troponin I (High Sensitivity): 6 ng/L (ref <18)

## 2021-08-12 MED ORDER — ATORVASTATIN CALCIUM 40 MG PO TABS
40.0000 mg | ORAL_TABLET | Freq: Every day | ORAL | Status: DC
  Administered 2021-08-13: 40 mg via ORAL
  Filled 2021-08-12: qty 1

## 2021-08-12 MED ORDER — METFORMIN HCL 500 MG PO TABS
1000.0000 mg | ORAL_TABLET | Freq: Two times a day (BID) | ORAL | Status: DC
  Administered 2021-08-13: 1000 mg via ORAL
  Filled 2021-08-12: qty 2

## 2021-08-12 MED ORDER — EMPAGLIFLOZIN 10 MG PO TABS
10.0000 mg | ORAL_TABLET | Freq: Every day | ORAL | Status: DC
  Filled 2021-08-12: qty 1

## 2021-08-12 MED ORDER — HYDROCODONE-ACETAMINOPHEN 5-325 MG PO TABS
1.0000 | ORAL_TABLET | Freq: Four times a day (QID) | ORAL | Status: DC | PRN
Start: 2021-08-12 — End: 2021-08-13

## 2021-08-12 MED ORDER — DILTIAZEM HCL-DEXTROSE 125-5 MG/125ML-% IV SOLN (PREMIX)
5.0000 mg/h | INTRAVENOUS | Status: DC
  Administered 2021-08-12: 5 mg/h via INTRAVENOUS
  Filled 2021-08-12: qty 125

## 2021-08-12 MED ORDER — SODIUM CHLORIDE 0.9% FLUSH
3.0000 mL | Freq: Two times a day (BID) | INTRAVENOUS | Status: DC
  Administered 2021-08-12 – 2021-08-13 (×2): 3 mL via INTRAVENOUS

## 2021-08-12 MED ORDER — APIXABAN 5 MG PO TABS
5.0000 mg | ORAL_TABLET | Freq: Two times a day (BID) | ORAL | Status: DC
  Administered 2021-08-13: 5 mg via ORAL
  Filled 2021-08-12: qty 1

## 2021-08-12 MED ORDER — LISINOPRIL 10 MG PO TABS
10.0000 mg | ORAL_TABLET | Freq: Every day | ORAL | Status: DC
  Administered 2021-08-13: 10 mg via ORAL
  Filled 2021-08-12: qty 1

## 2021-08-12 MED ORDER — SODIUM CHLORIDE 0.9% FLUSH: 3.0000 mL | INTRAVENOUS | Status: DC | PRN

## 2021-08-12 MED ORDER — SODIUM CHLORIDE 0.9 % IV SOLN
250.0000 mL | INTRAVENOUS | Status: DC | PRN
Start: 2021-08-12 — End: 2021-08-13

## 2021-08-12 MED ORDER — ACETAMINOPHEN 325 MG PO TABS: 650.0000 mg | ORAL_TABLET | Freq: Four times a day (QID) | ORAL | Status: DC | PRN

## 2021-08-12 MED ORDER — METOPROLOL SUCCINATE ER 50 MG PO TB24
50.0000 mg | ORAL_TABLET | Freq: Every day | ORAL | Status: DC
  Administered 2021-08-13: 50 mg via ORAL
  Filled 2021-08-12: qty 1

## 2021-08-12 MED ORDER — DILTIAZEM HCL 25 MG/5ML IV SOLN
10.0000 mg | Freq: Once | INTRAVENOUS | Status: AC
  Administered 2021-08-12: 10 mg via INTRAVENOUS

## 2021-08-12 MED ORDER — METOPROLOL TARTRATE 5 MG/5ML IV SOLN
5.0000 mg | Freq: Once | INTRAVENOUS | Status: AC
  Administered 2021-08-12: 5 mg via INTRAVENOUS
  Filled 2021-08-12: qty 5

## 2021-08-12 MED ORDER — GLIMEPIRIDE 2 MG PO TABS
1.0000 mg | ORAL_TABLET | Freq: Every evening | ORAL | Status: DC
  Filled 2021-08-12: qty 1

## 2021-08-12 MED ORDER — ONDANSETRON HCL 4 MG/2ML IJ SOLN: 4.0000 mg | Freq: Four times a day (QID) | INTRAMUSCULAR | Status: DC | PRN

## 2021-08-12 MED ORDER — PANTOPRAZOLE SODIUM 40 MG PO TBEC
40.0000 mg | DELAYED_RELEASE_TABLET | Freq: Every day | ORAL | Status: DC
Start: 2021-08-13 — End: 2021-08-13
  Administered 2021-08-13: 40 mg via ORAL
  Filled 2021-08-12: qty 1

## 2021-08-12 NOTE — H&P (Addendum)
?History and Physical  ? ? ?Patient: Trevor Steele ALP:[id number] DOB: 07/23/1960 ?DOA: 08/12/2021 ?DOS: the patient was seen and examined on 08/12/2021 ?PCP: [name], MD  ?Patient coming from: Home ? ?Chief Complaint:  ?Chief Complaint  ?Patient presents with  ? Palpitations  ? ?HPI: Trevor Steele is a 61 y.o. male with medical history significant of atrial fibrillation on Eliquis, type 2 diabetes mellitus, hypertension, tobacco use, hyperlipidemia, coronary artery calcifications, pulmonary nodules who presents with palpitations. Of note, he has had numerous ED visits and was hospitalized 08/02/21-08/04/21 for afib w/ RVR.  Evaluated by cardiologist (Dr. Harl Bowie) at that hospital stay. His LVEF was noted to be 45-50% on recent TTE.  Ultimately was able to achieve HR control on metoprolol. Returns tonight for recurrent afib.  Relatively asymptomatic, was alerted to a fast HR by smart watch. Denies SOB, chest pain, nausea, diaphoresis. Notices an occasional palpitation, particularly when he lies flat. TSH previously checked and noted to be WNL. ? ?In the ED, HR up to 144. Troponin negative x 1. CXR showed mild bibasilar atelectasis, no CHF. Cardizem drip initiated and cardiology consulted. Dr. Harl Bowie will see in the a.m. ? ? ?Review of Systems: As mentioned in the history of present illness. All other systems reviewed and are negative. ?Past Medical History:  ?Diagnosis Date  ? A-fib ([place])   ? Diabetes mellitus without complication ([place])   ? High cholesterol   ? Hypertension   ? ?Past Surgical History:  ?Procedure Laterality Date  ? BACK SURGERY    ? ?Social History:  reports that he quit smoking about 4 weeks ago. His smoking use included cigarettes. He smoked an average of .5 packs per day. He does not have any smokeless tobacco history on file. He reports that he does not drink alcohol and does not use drugs. ? ?No Known Allergies ? ?History reviewed. No pertinent family history. ? ?Prior to Admission  medications   ?Medication Sig Start Date End Date Taking? Authorizing Provider  ?acetaminophen (TYLENOL) 325 MG tablet Take 650 mg by mouth every 6 (six) hours as needed for mild pain.    [provider]  ?apixaban (ELIQUIS) 5 MG TABS tablet Take 1 tablet (5 mg total) by mouth 2 (two) times daily. 06/28/21 08/02/21  Fransico Meadow, PA-C  ?atorvastatin (LIPITOR) 40 MG tablet Take 1 tablet (40 mg total) by mouth daily. 07/16/21   Early Osmond, MD  ?empagliflozin (JARDIANCE) 10 MG TABS tablet Take 1 tablet (10 mg total) by mouth daily before breakfast. 07/16/21   Early Osmond, MD  ?esomeprazole (NEXIUM) 40 MG capsule Take 40 mg by mouth daily. 10/10/15   [provider]  ?glimepiride (AMARYL) 1 MG tablet Take 1 mg by mouth every evening.    [provider]  ?HYDROcodone-acetaminophen (NORCO/VICODIN) 5-325 MG tablet One tablet every six hours for pain.  Limit 7 days. ?Patient taking differently: Take 1 tablet by mouth every 6 (six) hours as needed for moderate pain. One tablet every six hours for pain.  Limit 7 days. 08/01/21   Sanjuana Kava, MD  ?lisinopril (ZESTRIL) 10 MG tablet Take 1 tablet (10 mg total) by mouth daily. 08/04/21   Johnson, Clanford L, MD  ?metFORMIN (GLUCOPHAGE) 1000 MG tablet Take 1,000 mg by mouth 2 (two) times daily. 10/10/15   [provider]  ?metoprolol succinate (TOPROL-XL) 50 MG 24 hr tablet Take 1 tablet (50 mg total) by mouth in the morning and at bedtime. 08/04/21   Wynetta Emery,  Clanford L, MD  ?Multiple Vitamin (MULTIVITAMIN WITH MINERALS) TABS tablet Take 1 tablet by mouth daily.    [provider]  ? ? ?Physical Exam: ?Vitals:  ? 08/12/21 2030 08/12/21 2100 08/12/21 2130 08/12/21 2147  ?BP: 112/79 115/87 100/75 110/76  ?Pulse: (!) 105 97 100 (!) 113  ?Resp: (!) 22 (!) '28 20 19  '$ ?Temp:      ?TempSrc:      ?SpO2: 99% 98% 99% 99%  ?Weight:      ?Height:      ? ?General: No acute distress. ?HEENT: Normocephalic/atraumatic. PERRLA. Fair dentition.   ?Cardiovascular: Heart sounds are irregular and tachycardic. No gallops or rubs. No murmurs. No JVD. ?Lungs: Clear to auscultation bilaterally with good air movement. No rales, rhonchi or wheezes. ?Abdomen: Soft, nontender, nondistended with normal active bowel sounds. No masses. No hepatosplenomegaly. ?Neurological: Alert and oriented ?3. Moves all extremities ?4 with equal strength. Cranial nerves II through XII grossly intact. ?Skin: Warm and dry. No rashes or lesions. ?Extremities: No clubbing or cyanosis. No edema. Pedal pulses 2+. ?Psychiatric: Mood and affect are normal. Insight and judgment are good. ? ?Data Reviewed: ? ?Labs/imaging/EKG reviewed. CXR negative for signs of pulmonary edema. Troponin negative. EKG showed afib at 144 bpm, ST depression in inferolateral leads.  ? ?Assessment and Plan: ?Present on Admission: ?Atrial fibrillation with RVR ([place]) ?Relatively asymptomatic.  Cardizem gtt initiated. Troponin negative and no signs of heart failure exacerbation clinically or on CXR. Continue Eliquis. Cardiology will evaluate in a.m. No need to repeat 2-D Echo or TSH at this time. ? ?Essential hypertension ?BP a bit soft. Continue Lisinopril, metoprolol. ? ?HFmrEF ?Monitor for exacerbation. Control heart rate. ? ?Hyperlipidemia associated with type 2 diabetes mellitus ([place]) ?Continue home meds: Lipitor, Metformin, Jardiance, Amaryl. Glucose 111 on CMET. ?  ? ? ? Advance Care Planning: Full. ? ?Consults: Cardiology (Dr. Harl Bowie) ? ?Family Communication: Wife at bedside. ? ?Severity of Illness: ?The appropriate patient status for this patient is OBSERVATION. Observation status is judged to be reasonable and necessary in order to provide the required intensity of service to ensure the patient's safety. The patient's presenting symptoms, physical exam findings, and initial radiographic and laboratory data in the context of their medical condition is felt to place them at decreased risk for further clinical  deterioration. Furthermore, it is anticipated that the patient will be medically stable for discharge from the hospital within 2 midnights of admission.  ? ?Author: ?Jacquelynn Cree, MD ?08/12/2021 10:05 PM ? ?For on call review [email or web address].  ?

## 2021-08-12 NOTE — ED Triage Notes (Signed)
Pt reports palpitations that started just pta; pt watching heart rate on his watch; pt states he was in hospital last week for same complaint; pt denies any chest pain ?

## 2021-08-13 ENCOUNTER — Telehealth: Payer: Self-pay | Admitting: Physician Assistant

## 2021-08-13 ENCOUNTER — Encounter (HOSPITAL_COMMUNITY): Payer: Self-pay | Admitting: Internal Medicine

## 2021-08-13 DIAGNOSIS — I429 Cardiomyopathy, unspecified: Secondary | ICD-10-CM

## 2021-08-13 DIAGNOSIS — I4819 Other persistent atrial fibrillation: Secondary | ICD-10-CM | POA: Diagnosis not present

## 2021-08-13 DIAGNOSIS — E785 Hyperlipidemia, unspecified: Secondary | ICD-10-CM

## 2021-08-13 DIAGNOSIS — I10 Essential (primary) hypertension: Secondary | ICD-10-CM

## 2021-08-13 DIAGNOSIS — E1169 Type 2 diabetes mellitus with other specified complication: Secondary | ICD-10-CM | POA: Diagnosis not present

## 2021-08-13 DIAGNOSIS — I5021 Acute systolic (congestive) heart failure: Secondary | ICD-10-CM

## 2021-08-13 DIAGNOSIS — I4891 Unspecified atrial fibrillation: Secondary | ICD-10-CM | POA: Diagnosis not present

## 2021-08-13 LAB — MAGNESIUM: Magnesium: 1.9 mg/dL (ref 1.7–2.4)

## 2021-08-13 MED ORDER — METOPROLOL SUCCINATE ER 50 MG PO TB24
50.0000 mg | ORAL_TABLET | Freq: Two times a day (BID) | ORAL | 0 refills | Status: AC
Start: 2021-08-13 — End: ?

## 2021-08-13 MED ORDER — DILTIAZEM HCL 30 MG PO TABS: 30.0000 mg | ORAL_TABLET | Freq: Four times a day (QID) | ORAL | 1 refills | Status: DC | PRN

## 2021-08-13 MED ORDER — METOPROLOL SUCCINATE ER 50 MG PO TB24: 50.0000 mg | ORAL_TABLET | Freq: Every day | ORAL | 0 refills | Status: DC

## 2021-08-13 MED ORDER — DILTIAZEM HCL 30 MG PO TABS
30.0000 mg | ORAL_TABLET | Freq: Four times a day (QID) | ORAL | Status: DC
  Administered 2021-08-13: 30 mg via ORAL
  Filled 2021-08-13: qty 1

## 2021-08-13 NOTE — Consult Note (Signed)
?Cardiology Consultation:  ? ?Patient ID: Trevor Steele ?MRN: [id number]; DOB: 10-Oct-1960 ? ?Admit date: 08/12/2021 ?Date of Consult: 08/13/2021 ? ?PCP:  [name], MD ?  ?[facility] Providers ?Cardiologist:  Early Osmond, MD      ? ? ?Patient Profile:  ? ?Trevor Steele is a 61 y.o. male with a hx of paroxysmal atrial fibrillation (diagnosed 06/2021), frequent PVCs, HTN, HLD, left neck mass with CT findings concerning for nodal metastasis, pulmonary nodules, mild-moderate AS, mild MR, mild dilation of ascending aorta (though not seen on CT 07/2021), coronary and aortic calcification by CT, emphysema by CT, former tobacco abuse who is being seen 08/13/2021 for the evaluation of recurrent atrial fibrillation at the request of Dr. Avon Gully. ? ?History of Present Illness:  ? ?[name] was recently diagnosed with afib in 06/2021 when he was sent by his PCP to the ED for new recognition of such. He was treated with IV Cardizem and IV Lopressor and spontaneously converted to NSR with ventricular ectopy. He was discharged on Toprol '50mg'$  daily and Eliquis '5mg'$  daily. He saw Dr. Ali Lowe to establish care 07/16/21 at which time he reported episodic slow HRs so metoprolol was decreased to '25mg'$  daily. (Of note, in retrospect, given frequent ectopy it is possible that accurate HR may not be registering on noninvasive monitors/smartwatch.) Outpatient echo 07/31/21 showed EF 45-50%, g1DD, mildly reduced RVF, mildly enlarged RV, mild LAE, mild MR, mild-moderate AS, mild dilation of ascending aorta. ? ?He was seen back in the hospital by our team for recurrent atrial fib RVR 08/02/21 after his Apple watch alerted him of tachycardia. He was again treated with IV diltiazem and converted back to NSR, with plan to transition to Toprol '50mg'$  BID at discharge. During that encounter he also reported having left a left neck mass for 6-8 weeks - CT scan was obtained showing enlarged, centrally necrotic left level 2A lymph nodes  concerning for nodal metastasis, without a definite primary in the neck. 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 He was referred to ENT as outpatient and states there was an issue connecting with the right ENT for the type of mass he has, and this was still pending. He quit smoking about 2 weeks ago. He no longer drinks but has a history of remote heavy ETOH use, having quit 30 years ago. ? ?Last night he was sitting at rest and again his Apple watch notified him of recurrent tachycardia so he came to the emergency room for evaluation. He perhaps may feel a little flutter here and there but has otherwise been largely asymptomatic without any chest pain, dyspnea, overt palpitations, dizziness, syncope, edema, weight gain or weight loss, fever, chills, nightsweats, signs of bleeding or dysphagia. He was found to be again in AF RVR in the 130s-140s so received '5mg'$  IV Lopressor, '10mg'$  IV diltiazem and started on a diltiazem drip which was transitioned to oral form this morning. He spontaneously converted back to NSR with frequent PVCs around 8:45am. ? ?Past Medical History:  ?Diagnosis Date  ? Aortic atherosclerosis ([place])   ? Aortic stenosis   ? Coronary artery calcification   ? Diabetes mellitus without complication ([place])   ? High cholesterol   ? Hypertension   ? Mild dilation  of ascending aorta ([place])   ? by echo 07/2021 though not corroborated on CT  ? Mitral regurgitation   ? Neck mass   ? Abnormal neck CT 07/2021 concerning for nodal metastasis  ? PAF (paroxysmal atrial fibrillation) ([place])   ? PVC's (premature ventricular contractions)   ? ? ?Past Surgical History:  ?Procedure Laterality Date  ? BACK SURGERY    ?   ? ?Home Medications:  ?Prior to Admission medications   ?Medication Sig Start Date End Date Taking? Authorizing Provider  ?acetaminophen (TYLENOL) 325 MG tablet Take 650 mg by mouth every 6 (six) hours as needed for mild pain.   Yes [provider]  ?apixaban (ELIQUIS) 5 MG TABS tablet Take 1 tablet (5 mg total) by mouth 2 (two) times daily. 06/28/21 08/13/21 Yes Fransico Meadow, PA-C  ?atorvastatin (LIPITOR) 40 MG tablet Take 1 tablet (40 mg total) by mouth daily. 07/16/21  Yes Early Osmond, MD  ?empagliflozin (JARDIANCE) 10 MG TABS tablet Take 1 tablet (10 mg total) by mouth daily before breakfast. 07/16/21  Yes Early Osmond, MD  ?esomeprazole (NEXIUM) 40 MG capsule Take 40 mg by mouth daily. 10/10/15  Yes [provider]  ?glimepiride (AMARYL) 1 MG tablet Take 1 mg by mouth every evening.   Yes [provider]  ?HYDROcodone-acetaminophen (NORCO/VICODIN) 5-325 MG tablet One tablet every six hours for pain.  Limit 7 days. ?Patient taking differently: Take 1 tablet by mouth every 6 (six) hours as needed for moderate pain. One tablet every six hours for pain.  Limit 7 days. 08/01/21  Yes Sanjuana Kava, MD  ?lisinopril (ZESTRIL) 10 MG tablet Take 1 tablet (10 mg total) by mouth daily. 08/04/21  Yes Johnson, Clanford L, MD  ?metFORMIN (GLUCOPHAGE) 1000 MG tablet Take 1,000 mg by mouth 2 (two) times daily. 10/10/15  Yes [provider]  ?metoprolol succinate (TOPROL-XL) 50 MG 24 hr tablet Take 1 tablet (50 mg total) by mouth in the morning and at bedtime. 08/04/21  Yes Johnson, Clanford L, MD  ?Multiple Vitamin (MULTIVITAMIN WITH MINERALS) TABS tablet Take 1 tablet by mouth daily.   Yes [provider]  ? ? ?Inpatient Medications: ?Scheduled Meds: ? apixaban  5 mg Oral BID  ? atorvastatin  40 mg Oral Daily  ? diltiazem  30 mg Oral Q6H  ? empagliflozin  10 mg Oral QAC breakfast  ? glimepiride  1 mg Oral QPM  ? lisinopril  10 mg Oral Daily  ? metFORMIN  1,000 mg Oral BID  ?  metoprolol succinate  50 mg Oral Daily  ? pantoprazole  40 mg Oral Daily  ? sodium chloride flush  3 mL Intravenous Q12H  ? ?Continuous Infusions: ? sodium chloride    ? diltiazem (CARDIZEM) infusion Stopped (08/13/21 0912)  ? ?PRN Meds: ?sodium chloride, acetaminophen, HYDROcodone-acetaminophen, ondansetron (ZOFRAN) IV, sodium chloride flush ? ?Allergies:   No Known Allergies ? ?Social History:   ?Social History  ? ?Socioeconomic History  ? Marital status: Married  ?  Spouse name: Not on file  ? Number of children: Not on file  ? Years of education: Not on file  ? Highest education level: Not on file  ?Occupational History  ? Not on file  ?Tobacco Use  ? Smoking status: Former  ?  Packs/day: 0.50  ?  Types: Cigarettes  ?  Quit date: 07/09/2021  ?  Years since quitting: 0.0  ? Smokeless tobacco: Not on file  ?Vaping Use  ? Vaping Use: Never  used  ?Substance and Sexual Activity  ? Alcohol use: No  ? Drug use: No  ? Sexual activity: Not on file  ?Other Topics Concern  ? Not on file  ?Social History Narrative  ? Not on file  ? ?Social Determinants of Health  ? ?Financial Resource Strain: Not on file  ?Food Insecurity: Not on file  ?Transportation Needs: Not on file  ?Physical Activity: Not on file  ?Stress: Not on file  ?Social Connections: Not on file  ?Intimate Partner Violence: Not on file  ?  ?Family History:   ?History reviewed. No pertinent family history.  ? ?ROS:  ?Please see the history of present illness.  ?All other ROS reviewed and negative.    ? ?Physical Exam/Data:  ? ?Vitals:  ? 08/13/21 0315 08/13/21 0630 08/13/21 0700 08/13/21 0800  ?BP: 117/84 122/79 107/89 (!) 146/92  ?Pulse: 89 93 81 95  ?Resp: 19 20 (!) 22 19  ?Temp:   98.2 ?F (36.8 ?C)   ?TempSrc:   Oral   ?SpO2: 98% 98% 98% 97%  ?Weight:      ?Height:      ? ?No intake or output data in the 24 hours ending 08/13/21 1037 ? ?  08/12/2021  ?  6:35 PM 08/04/2021  ?  5:00 AM 08/03/2021  ?  4:27 AM  ?Last 3 Weights  ?Weight (lbs) 199 lb 8.3 oz 199 lb 8.3 oz  199 lb 1.2 oz  ?Weight (kg) 90.5 kg 90.5 kg 90.3 kg  ?   ?Body mass index is 27.83 kg/m?.  ?General: Well developed, well nourished WM, in no acute distress. ?Head: Normocephalic, atraumatic, sclera non-icteric, no xa

## 2021-08-13 NOTE — ED Notes (Signed)
Per Avon Gully, MD, titrating Cardizem drip to wean off. Patient denies other needs at this time  ?

## 2021-08-13 NOTE — Discharge Summary (Addendum)
Physician Discharge Summary  ?Trevor Steele FGH:[id number] DOB: 30-Aug-1960 DOA: 08/12/2021 ? ?PCP: [name], MD ? ?Admit date: 08/12/2021 ?Discharge date: 08/13/2021 ? ?Admitted From: Home ?Disposition: Home ? ?Recommendations for Outpatient Follow-up:  ?Follow up with PCP in 1-2 weeks ?Please follow-up with cardiology for outpatient Lexiscan as well as follow-up at [facility] as discussed ? ?Home Health: None ?Equipment/Devices: None ? ?Discharge Condition: Stable ?CODE STATUS: Full ?Diet recommendation: Low-salt low-fat diabetic diet ? ?Brief/Interim Summary: ?Trevor Steele is a 61 y.o. male with medical history significant of atrial fibrillation on Eliquis, type 2 diabetes mellitus, hypertension, tobacco use, hyperlipidemia, coronary artery calcifications, pulmonary nodules who presents with palpitations.  ? ?*Patient has had numerous visits to the ED and hospitalized earlier this month for recurrent A-fib with RVR with extensive cardiac work-up including echo.  Patient presents again for A-fib with RVR rate in the 150s according to his watch but denies any symptoms even with exertion.  He only presented back to the hospital due to the elevated heart rate around 150.  Cardiology following, patient weaned off diltiazem drip, cardiology recommending Toprol-XL 50 twice daily and as needed diltiazem with close follow-up outpatient for [place] as well as A-fib clinic follow-up in [place].  Patient may also benefit from sleep study as transient hypoxic events may trigger A-fib/RVR.  Remainder of work-up here is unremarkable, EKG this morning shows sinus rhythm, rate controlled and patient is otherwise asymptomatic and agreeable for discharge home. ? ?Discharge Diagnoses:  ?Principal Problem: ?  Atrial fibrillation with RVR ([place]) ?Active Problems: ?  Type 2 diabetes mellitus ([place]) ?  Essential hypertension ?  Hyperlipidemia associated with type 2 diabetes mellitus ([place]) ?  HFmrEF ? ? ? ?Discharge  Instructions ? ?Discharge Instructions   ? ? Discharge patient   Complete by: As directed ?  ? Discharge disposition: 01-Home or Self Care  ? Discharge patient date: 08/13/2021  ? ?  ? ?Allergies as of 08/13/2021   ?No Known Allergies ?  ? ?  ?Medication List  ?  ? ?STOP taking these medications   ? ?HYDROcodone-acetaminophen 5-325 MG tablet ?Commonly known as: NORCO/VICODIN ?  ? ?  ? ?TAKE these medications   ? ?acetaminophen 325 MG tablet ?Commonly known as: TYLENOL ?Take 650 mg by mouth every 6 (six) hours as needed for mild pain. ?  ?apixaban 5 MG Tabs tablet ?Commonly known as: ELIQUIS ?Take 1 tablet (5 mg total) by mouth 2 (two) times daily. ?  ?atorvastatin 40 MG tablet ?Commonly known as: LIPITOR ?Take 1 tablet (40 mg total) by mouth daily. ?  ?diltiazem 30 MG tablet ?Commonly known as: Cardizem ?Take 1 tablet (30 mg total) by mouth every 6 (six) hours as needed (palpitations). ?  ?empagliflozin 10 MG Tabs tablet ?Commonly known as: Jardiance ?Take 1 tablet (10 mg total) by mouth daily before breakfast. ?  ?esomeprazole 40 MG capsule ?Commonly known as: [place] ?Take 40 mg by mouth daily. ?  ?glimepiride 1 MG tablet ?Commonly known as: AMARYL ?Take 1 mg by mouth every evening. ?  ?lisinopril 10 MG tablet ?Commonly known as: ZESTRIL ?Take 1 tablet (10 mg total) by mouth daily. ?  ?metFORMIN 1000 MG tablet ?Commonly known as: GLUCOPHAGE ?Take 1,000 mg by mouth 2 (two) times daily. ?  ?metoprolol succinate 50 MG 24 hr tablet ?Commonly known as: TOPROL-XL ?Take 1 tablet (50 mg total) by mouth in the morning and at bedtime. Take with or immediately following a meal. ?What changed: additional instructions ?  ?  multivitamin with minerals Tabs tablet ?Take 1 tablet by mouth daily. ?  ? ?  ? ? ?No Known Allergies ? ?Consultations: ?Cardiology, Dr. Radford Pax ? ? ?Procedures/Studies: ?DG Chest 2 View ? ?Result Date: 08/02/2021 ?CLINICAL DATA:  New onset AFib.  Chest pain. EXAM: CHEST - 2 VIEW COMPARISON:  06/28/2021 FINDINGS:  Lungs are hyperexpanded. The lungs are clear without focal pneumonia, edema, pneumothorax or pleural effusion. Interstitial markings are diffusely coarsened with chronic features. The cardiopericardial silhouette is within normal limits for size. Telemetry leads overlie the chest. Old left rib fractures noted. IMPRESSION: Hyperexpansion without acute cardiopulmonary findings. Electronically Signed   By: Misty Stanley M.D.   On: 08/02/2021 07:36  ? ?CT SOFT TISSUE NECK W CONTRAST ? ?Result Date: 08/02/2021 ?CLINICAL DATA:  Left mandibular submandibular neck mass or swelling. EXAM: CT NECK WITH CONTRAST TECHNIQUE: Multidetector CT imaging of the neck was performed using the standard protocol following the bolus administration of intravenous contrast. RADIATION DOSE REDUCTION: This exam was performed according to the departmental dose-optimization program which includes automated exposure control, adjustment of the mA and/or kV according to patient size and/or use of iterative reconstruction technique. CONTRAST:  53m OMNIPAQUE IOHEXOL 300 MG/ML  SOLN COMPARISON:  None. FINDINGS: Pharynx and larynx: Symmetric prominence of the lingual tonsils. No mass or swelling. Salivary glands: No inflammation, mass, or obstructing stone. A calcification is noted in the posterior left parotid gland. Thyroid: Calcification within the left thyroid lobe, which appears similar to the 12/04/2020 CT. Otherwise negative. Lymph nodes: Enlarged left level 2A lymph nodes with central low density, favored to represent 3 lymph nodes in conglomerate, which measure up to 1.6 x 1.5 x 1.3 cm (series 2, image 41 and series 4, image 64), 2.5 x 1.4 x 1.6 cm (series 2, image 45 and series 4, image 61), and 1.6 x 1.5 x 0.9 cm (series 2, image 51 series 4, image 60). No other enlarged or abnormal appearing lymph nodes are seen. Vascular: No acute finding.  Atherosclerotic disease. Limited intracranial: Negative. Visualized orbits: Negative. Mastoids and  visualized paranasal sinuses: Mucous retention cysts in the left maxillary sinus. Otherwise clear. Skeleton: No acute osseous abnormality. Upper chest: Centrilobular and paraseptal emphysema. Reticulation, similar to the prior exam. No focal pulmonary opacity or pleural effusion. Other: None. IMPRESSION: Enlarged, centrally necrotic left level 2A lymph nodes, which are concerning for nodal metastasis, without a definite primary in the neck. Recommend ENT consultation and sampling. Electronically Signed   By: AMerilyn BabaM.D.   On: 08/02/2021 15:54  ? ?CT CHEST ABDOMEN PELVIS W CONTRAST ? ?Result Date: 08/03/2021 ?CLINICAL DATA:  Necrotic and enlarged left cervical lymph nodes. Atrial fibrillation, palpitations and smoking history. EXAM: CT CHEST, ABDOMEN, AND PELVIS WITH CONTRAST TECHNIQUE: Multidetector CT imaging of the chest, abdomen and pelvis was performed following the standard protocol during bolus administration of intravenous contrast. RADIATION DOSE REDUCTION: This exam was performed according to the departmental dose-optimization program which includes automated exposure control, adjustment of the mA and/or kV according to patient size and/or use of iterative reconstruction technique. CONTRAST:  1032mOMNIPAQUE IOHEXOL 300 MG/ML  SOLN COMPARISON:  CT of the neck on 08/02/2021, low-dose CT of the chest on 12/04/2020 and prior CT of the abdomen and pelvis on 10/24/2015 FINDINGS: CT CHEST FINDINGS Cardiovascular: Mild atherosclerosis of the thoracic aorta without aneurysm. Normal heart size. Coronary atherosclerosis. No pericardial fluid. Central pulmonary arteries are normal in caliber. Mediastinum/Nodes: No enlarged mediastinal, hilar, or axillary lymph nodes. Thyroid gland, trachea,  and esophagus demonstrate no significant findings. Lungs/Pleura: Stable emphysema and small scattered peripheral pulmonary nodules. The largest measures approximately 5.7 mm on image 123 in the lateral right lower lobe and has  a stable appearance compared to the prior chest CT. Musculoskeletal: No bony lesions or fractures identified. CT ABDOMEN PELVIS FINDINGS Hepatobiliary: No focal liver abnormality is seen. No gallstones, gallbladder

## 2021-08-13 NOTE — Telephone Encounter (Signed)
Calcium Score order placed per Dr. Radford Pax. ?

## 2021-08-13 NOTE — ED Notes (Addendum)
Patient ambulated to restroom with standby assistance. Patient has steady gait. Patient's heart rate elevated between 120 and 136 while retuning to room. Patient assisted back to bed, heart rate now reading 65 to 71. ?

## 2021-08-13 NOTE — Telephone Encounter (Signed)
Dr. Radford Pax saw patient in consultation today and called me to clarify her recommendations. Patient is being discharged. ? ?Per Dr. Radford Pax, she recommends: ?- getting into see Dr. Lovena Le for EP in [place] soon - if no appt availability soon with EP, would have him f/u closely with the afib clinic instead ?- she recommends a coronary calcium score (to clarify from consult note, she prefers to start with this instead of Lexiscan as she was originally considering) ?- otherwise IM team is aware to review her med recs in consult note ? ?Will route this message to EP scheduler about EP follow-up. [place], please route to afib clinic if no availability with G. Lovena Le. Will also route to [place] team to assist with placing order/arranging the coronary calcium score under Dr. Radford Pax (not a coronary CTA, just calcium score). ? ?Thank you! ?Avalyn Molino ?

## 2021-08-13 NOTE — TOC Progression Note (Signed)
Transition of Care (TOC) - Progression Note  ? ? ?Patient Details  ?Name: YUVAL RUBENS ?MRN: [id number] ?Date of Birth: 1960/09/29 ? ?Transition of Care (TOC) CM/SW Contact  ?Salome Arnt, LCSW ?Phone Number: ?08/13/2021, 11:05 AM ? ?Clinical Narrative:   ?Transition of Care (TOC) Screening Note ? ? ?Patient Details  ?Name: TYTUS STRAHLE ?Date of Birth: January 09, 1961 ? ? ?Transition of Care (TOC) CM/SW Contact:    ?Salome Arnt, LCSW ?Phone Number: ?08/13/2021, 11:05 AM ? ? ? ?Transition of Care Department [facility]) has reviewed patient and no TOC needs have been identified at this time. We will continue to monitor patient advancement through interdisciplinary progression rounds. If new patient transition needs arise, please place a TOC consult. ?   ? ? ? ?  ?Barriers to Discharge: Barriers Resolved ? ?Expected Discharge Plan and Services ?  ?  ?  ?  ?  ?                ?  ?  ?  ?  ?  ?  ?  ?  ?  ?  ? ? ?Social Determinants of Health (SDOH) Interventions ?  ? ?Readmission Risk Interventions ?   ? View : No data to display.  ?  ?  ?  ? ? ?

## 2021-08-14 ENCOUNTER — Other Ambulatory Visit: Payer: Self-pay

## 2021-08-14 DIAGNOSIS — E1169 Type 2 diabetes mellitus with other specified complication: Secondary | ICD-10-CM

## 2021-08-14 NOTE — Patient Outreach (Signed)
[facility]) Care Management ? ?08/14/2021 ? ?Trevor Steele ?12-01-1960 ?[id number] ? ? ?Received hospital referral from [place] [place] for RN case manager for [facility] call and complex case management services. PCP is embedded with [facility], sent referral to [facility] for follow up. ? ?Philmore Pali ?[place] Management Assistant ?[phone] ? ?

## 2021-08-14 NOTE — ED Provider Notes (Signed)
?[facility] ?Provider Note ? ? ?CSN: [id number] ?Arrival date & time: 08/12/21  1813 ? ?  ? ?History ? ?Chief Complaint  ?Patient presents with  ? Palpitations  ? ? ?Trevor Steele is a 61 y.o. male. ? ?Patient with history of atrial fibs and elevated lipids.  Patient comes in with rapid palpitation ? ?The history is provided by the patient. No language interpreter was used.  ?Palpitations ?Palpitations quality:  Irregular ?Onset quality:  With exertion ?Timing:  Constant ?Progression:  Worsening ?Chronicity:  Recurrent ?Context: not anxiety   ?Relieved by:  Nothing ?Worsened by:  Nothing ?Ineffective treatments:  None tried ?Associated symptoms: no back pain, no chest pain and no cough   ?Risk factors: no diabetes mellitus   ? ?  ? ?Home Medications ?Prior to Admission medications   ?Medication Sig Start Date End Date Taking? Authorizing Provider  ?acetaminophen (TYLENOL) 325 MG tablet Take 650 mg by mouth every 6 (six) hours as needed for mild pain.   Yes [provider]  ?apixaban (ELIQUIS) 5 MG TABS tablet Take 1 tablet (5 mg total) by mouth 2 (two) times daily. 06/28/21 08/13/21 Yes Fransico Meadow, PA-C  ?atorvastatin (LIPITOR) 40 MG tablet Take 1 tablet (40 mg total) by mouth daily. 07/16/21  Yes Early Osmond, MD  ?diltiazem (CARDIZEM) 30 MG tablet Take 1 tablet (30 mg total) by mouth every 6 (six) hours as needed (palpitations). 08/13/21 08/13/22 Yes Dunn, Dayna N, PA-C  ?empagliflozin (JARDIANCE) 10 MG TABS tablet Take 1 tablet (10 mg total) by mouth daily before breakfast. 07/16/21  Yes Early Osmond, MD  ?esomeprazole (NEXIUM) 40 MG capsule Take 40 mg by mouth daily. 10/10/15  Yes [provider]  ?glimepiride (AMARYL) 1 MG tablet Take 1 mg by mouth every evening.   Yes [provider]  ?lisinopril (ZESTRIL) 10 MG tablet Take 1 tablet (10 mg total) by mouth daily. 08/04/21  Yes Johnson, Clanford L, MD  ?metFORMIN (GLUCOPHAGE) 1000 MG tablet Take 1,000 mg by  mouth 2 (two) times daily. 10/10/15  Yes [provider]  ?Multiple Vitamin (MULTIVITAMIN WITH MINERALS) TABS tablet Take 1 tablet by mouth daily.   Yes [provider]  ?metoprolol succinate (TOPROL-XL) 50 MG 24 hr tablet Take 1 tablet (50 mg total) by mouth in the morning and at bedtime. Take with or immediately following a meal. 08/13/21   Little Ishikawa, MD  ?   ? ?Allergies    ?Patient has no known allergies.   ? ?Review of Systems   ?Review of Systems  ?Constitutional:  Negative for appetite change and fatigue.  ?HENT:  Negative for congestion, ear discharge and sinus pressure.   ?Eyes:  Negative for discharge.  ?Respiratory:  Negative for cough.   ?Cardiovascular:  Positive for palpitations. Negative for chest pain.  ?Gastrointestinal:  Negative for abdominal pain and diarrhea.  ?Genitourinary:  Negative for frequency and hematuria.  ?Musculoskeletal:  Negative for back pain.  ?Skin:  Negative for rash.  ?Neurological:  Negative for seizures and headaches.  ?Psychiatric/Behavioral:  Negative for hallucinations.   ? ?Physical Exam ?Updated Vital Signs ?BP 133/68 (BP Location: Right Arm)   Pulse (!) 39   Temp 97.7 ?F (36.5 ?C) (Oral)   Resp 18   Ht '5\' 11"'$  (1.803 m)   Wt 90.5 kg   SpO2 100%   BMI 27.83 kg/m?  ?Physical Exam ?Vitals and nursing note reviewed.  ?Constitutional:   ?   Appearance: He  is well-developed.  ?HENT:  ?   Head: Normocephalic.  ?   Nose: Nose normal.  ?Eyes:  ?   General: No scleral icterus. ?   Conjunctiva/sclera: Conjunctivae normal.  ?Neck:  ?   Thyroid: No thyromegaly.  ?Cardiovascular:  ?   Rate and Rhythm: Tachycardia present. Rhythm irregular.  ?   Heart sounds: No murmur heard. ?  No friction rub. No gallop.  ?Pulmonary:  ?   Breath sounds: No stridor. No wheezing or rales.  ?Chest:  ?   Chest wall: No tenderness.  ?Abdominal:  ?   General: There is no distension.  ?   Tenderness: There is no abdominal tenderness. There is no rebound.  ?Musculoskeletal:      ?   General: Normal range of motion.  ?   Cervical back: Neck supple.  ?Lymphadenopathy:  ?   Cervical: No cervical adenopathy.  ?Skin: ?   Findings: No erythema or rash.  ?Neurological:  ?   Mental Status: He is alert and oriented to person, place, and time.  ?   Motor: No abnormal muscle tone.  ?   Coordination: Coordination normal.  ?Psychiatric:     ?   Behavior: Behavior normal.  ? ? ?ED Results / Procedures / Treatments   ?Labs ?(all labs ordered are listed, but only abnormal results are displayed) ?Labs Reviewed  ?CBC WITH DIFFERENTIAL/PLATELET - Abnormal; Notable for the following components:  ?    Result Value  ? Eosinophils Absolute 0.6 (*)   ? All other components within normal limits  ?COMPREHENSIVE METABOLIC PANEL - Abnormal; Notable for the following components:  ? CO2 20 (*)   ? Glucose, Bld 111 (*)   ? All other components within normal limits  ?MAGNESIUM  ?TROPONIN I (HIGH SENSITIVITY)  ?TROPONIN I (HIGH SENSITIVITY)  ? ? ?EKG ?EKG Interpretation ? ?Date/Time:  Monday August 12 2021 18:32:49 EDT ?Ventricular Rate:  141 ?PR Interval:    ?QRS Duration: 79 ?QT Interval:  295 ?QTC Calculation: 452 ?R Axis:   53 ?Text Interpretation: Atrial fibrillation Abnormal R-wave progression, early transition Minimal ST depression, anterolateral leads When compared with ECG of 08/02/2021, No significant change was found Confirmed by Delora Fuel ([id number]) on 08/13/2021 12:46:41 AM ? ?Radiology ?DG Chest Port 1 View ? ?Result Date: 08/12/2021 ?CLINICAL DATA:  Palpitations. EXAM: PORTABLE CHEST 1 VIEW COMPARISON:  August 02, 2021 FINDINGS: Mild, chronic appearing increased lung markings are seen. Mild atelectasis is noted within the bilateral lung bases. There is no evidence of a pleural effusion or pneumothorax. The heart size and mediastinal contours are within normal limits. Degenerative changes seen throughout the thoracic spine. IMPRESSION: Chronic appearing increased lung markings with mild bibasilar atelectasis.  Electronically Signed   By: Virgina Norfolk M.D.   On: 08/12/2021 19:11   ? ?Procedures ?Procedures  ? ? ?Medications Ordered in ED ?Medications  ?metoprolol tartrate (LOPRESSOR) injection 5 mg (5 mg Intravenous Given 08/12/21 1901)  ?diltiazem (CARDIZEM) injection 10 mg (10 mg Intravenous Given 08/12/21 1926)  ? ? ?ED Course/ Medical Decision Making/ A&P ? CRITICAL CARE ?Performed by: Milton Ferguson ?Total critical care time: 40 minutes ?Critical care time was exclusive of separately billable procedures and treating other patients. ?Critical care was necessary to treat or prevent imminent or life-threatening deterioration. ?Critical care was time spent personally by me on the following activities: development of treatment plan with patient and/or surrogate as well as nursing, discussions with consultants, evaluation of patient's response to treatment, examination of patient,  obtaining history from patient or surrogate, ordering and performing treatments and interventions, ordering and review of laboratory studies, ordering and review of radiographic studies, pulse oximetry and re-evaluation of patient's condition. ? ?                        ?Medical Decision Making ?Amount and/or Complexity of Data Reviewed ?Labs: ordered. ?Radiology: ordered. ? ?Risk ?Prescription drug management. ?Decision regarding hospitalization. ? ?This patient presents to the ED for palpitation, this involves an extensive number of treatment options, and is a complaint that carries with it a high risk of complications and morbidity.  The differential diagnosis includes rapid atrial fibs ? ? ?Co morbidities that complicate the patient evaluation ? ?Update lipid ? ? ?Additional history obtained: ? ?Additional history obtained from patient ?External records from outside source obtained and reviewed including hospital record ? ? ?Lab Tests: ? ?I Ordered, and personally interpreted labs.  The pertinent results include: CBC and chemistries and  troponin all unremarkable ? ? ?Imaging Studies ordered: ? ?I ordered imaging studies including chest x-ray ?I independently visualized and interpreted imaging which showed bibasilar atelectasis ?I agree with the r

## 2021-08-16 DIAGNOSIS — I4891 Unspecified atrial fibrillation: Secondary | ICD-10-CM | POA: Diagnosis not present

## 2021-08-16 DIAGNOSIS — F17210 Nicotine dependence, cigarettes, uncomplicated: Secondary | ICD-10-CM | POA: Diagnosis not present

## 2021-08-16 DIAGNOSIS — R221 Localized swelling, mass and lump, neck: Secondary | ICD-10-CM | POA: Diagnosis not present

## 2021-08-16 DIAGNOSIS — F17290 Nicotine dependence, other tobacco product, uncomplicated: Secondary | ICD-10-CM | POA: Diagnosis not present

## 2021-08-16 DIAGNOSIS — Z7901 Long term (current) use of anticoagulants: Secondary | ICD-10-CM | POA: Diagnosis not present

## 2021-08-20 NOTE — Telephone Encounter (Signed)
Note was also routed to [place] last week, can you please touch base with EP scheduling to ensure she saw this msg? Thanks. ?

## 2021-08-21 NOTE — Telephone Encounter (Signed)
Pt has appt with Dr. Lovena Le on 08/23/2021 @ ?

## 2021-08-21 NOTE — Telephone Encounter (Signed)
**  Pt has appt with Dr. Lovena Le on 08/23/21 @ 11:15 am for consult.  ?

## 2021-08-23 ENCOUNTER — Ambulatory Visit (INDEPENDENT_AMBULATORY_CARE_PROVIDER_SITE_OTHER): Payer: BC Managed Care – PPO | Admitting: Internal Medicine

## 2021-08-23 ENCOUNTER — Encounter: Payer: Self-pay | Admitting: Internal Medicine

## 2021-08-23 VITALS — BP 130/64 | HR 72 | Ht 71.0 in | Wt 198.4 lb

## 2021-08-23 DIAGNOSIS — I4891 Unspecified atrial fibrillation: Secondary | ICD-10-CM | POA: Diagnosis not present

## 2021-08-23 MED ORDER — FLECAINIDE ACETATE 50 MG PO TABS: 50.0000 mg | ORAL_TABLET | Freq: Two times a day (BID) | ORAL | 11 refills | Status: DC

## 2021-08-23 NOTE — Patient Instructions (Addendum)
Medication Instructions:  ?Your physician has recommended you make the following change in your medication:  ? ? START taking flecainide 50 mg-  Take one tablet by mouth twice a day. ? ? ?Labwork: ?None ordered. ? ?Testing/Procedures: ?None ordered. ? ?Follow-Up: ? ?You will need a nurse visit for an EKG for new start flecainide in 2 weeks at the [place] office. ? ?Your physician wants you to follow-up in: 3 months with Cristopher Peru, MD in [place]. ? ? ?Any Other Special Instructions Will Be Listed Below (If Applicable). ? ?If you need a refill on your cardiac medications before your next appointment, please call your pharmacy.  ? ?Important Information About Sugar ? ? ? ? ? ?Flecainide Tablets ?What is this medication? ?FLECAINIDE (FLEK a nide) prevents and treats a fast or irregular heartbeat (arrhythmia). It is often used to treat a type of arrhythmia known as AFib (atrial fibrillation). It works by slowing down overactive electric signals in the heart, which stabilizes your heart rhythm. It belongs to a group of medications called antiarrhythmics. ?This medicine may be used for other purposes; ask your health care provider or pharmacist if you have questions. ?COMMON BRAND NAME(S): Tambocor ?What should I tell my care team before I take this medication? ?They need to know if you have any of these conditions: ?Abnormal levels of potassium in the blood ?Heart disease including heart rhythm and heart rate problems ?Kidney or liver disease ?Recent heart attack ?An unusual or allergic reaction to flecainide, local anesthetics, other medications, foods, dyes, or preservatives ?Pregnant or trying to get pregnant ?Breast-feeding ?How should I use this medication? ?Take this medication by mouth with a glass of water. Follow the directions on the prescription label. You can take this medication with or without food. Take your doses at regular intervals. Do not take your medication more often than directed. Do not  stop taking this medication suddenly. This may cause serious, heart-related side effects. If your care team wants you to stop the medication, the dose may be slowly lowered over time to avoid any side effects. ?Talk to your care team regarding the use of this medication in children. While this medication may be prescribed for children as young as 1 year of age for selected conditions, precautions do apply. ?Overdosage: If you think you have taken too much of this medicine contact a poison control center or emergency room at once. ?NOTE: This medicine is only for you. Do not share this medicine with others. ?What if I miss a dose? ?If you miss a dose, take it as soon as you can. If it is almost time for your next dose, take only that dose. Do not take double or extra doses. ?What may interact with this medication? ?Do not take this medication with any of the following: ?Amoxapine ?Arsenic trioxide ?Certain antibiotics like clarithromycin, erythromycin, gatifloxacin, gemifloxacin, levofloxacin, moxifloxacin, sparfloxacin, or troleandomycin ?Certain antidepressants called tricyclic antidepressants like amitriptyline, imipramine, or nortriptyline ?Certain medications to control heart rhythm like disopyramide, encainide, moricizine, procainamide, propafenone, and quinidine ?Cisapride ?Delavirdine ?Droperidol ?Haloperidol ?[place]orn ?Imatinib ?Levomethadyl ?Maprotiline ?Medications for malaria like chloroquine and halofantrine ?Pentamidine ?Phenothiazines like chlorpromazine, mesoridazine, prochlorperazine, thioridazine ?Pimozide ?Quinine ?Ranolazine ?Ritonavir ?Sertindole ?This medication may also interact with the following: ?Cimetidine ?Dofetilide ?Medications for angina or high blood pressure ?Medications to control heart rhythm like amiodarone and digoxin ?Ziprasidone ?This list may not describe all possible interactions. Give your health care provider a list of all the medicines, herbs, non-prescription drugs, or  dietary supplements you  use. Also tell them if you smoke, drink alcohol, or use illegal drugs. Some items may interact with your medicine. ?What should I watch for while using this medication? ?Visit your care team for regular checks on your progress. Because your condition and the use of this medication carries some risk, it is a good idea to carry an identification card, necklace or bracelet with details of your condition, medications, and care team. ?Check your blood pressure and pulse rate regularly. Ask your care team what your blood pressure and pulse rate should be, and when you should contact them. Your care team also may schedule regular blood tests and electrocardiograms to check your progress. ?You may get drowsy or dizzy. Do not drive, use machinery, or do anything that needs mental alertness until you know how this medication affects you. Do not stand or sit up quickly, especially if you are an older patient. This reduces the risk of dizzy or fainting spells. Alcohol can make you more dizzy, increase flushing and rapid heartbeats. Avoid alcoholic drinks. ?What side effects may I notice from receiving this medication? ?Side effects that you should report to your care team as soon as possible: ?Allergic reactions--skin rash, itching, hives, swelling of the face, lips, tongue, or throat ?Heart failure--shortness of breath, swelling of the ankles, feet, or hands, sudden weight gain, unusual weakness or fatigue ?Heart rhythm changes--fast or irregular heartbeat, dizziness, feeling faint or lightheaded, chest pain, trouble breathing ?Liver injury--right upper belly pain, loss of appetite, nausea, light-colored stool, dark yellow or brown urine, yellowing skin or eyes, unusual weakness or fatigue ?Side effects that usually do not require medical attention (report to your care team if they continue or are bothersome): ?Blurry vision ?Constipation ?Dizziness ?Fatigue ?Headache ?Nausea ?Tremors or shaking ?This  list may not describe all possible side effects. Call your doctor for medical advice about side effects. You may report side effects to FDA at 1-800-FDA-1088. ?Where should I keep my medication? ?Keep out of the reach of children and pets. ?Store at room temperature between 15 and 30 degrees C (59 and 86 degrees F). Protect from light. Keep container tightly closed. Throw away any unused medication after the expiration date. ?NOTE: This sheet is a summary. It may not cover all possible information. If you have questions about this medicine, talk to your doctor, pharmacist, or health care provider. ?? 2023 Elsevier/Gold Standard (2020-06-15 00:00:00) ? ? ?

## 2021-08-23 NOTE — Progress Notes (Signed)
? ? ? ? ?HPI ?[name] is referred today for evaluation of PAF. He is a pleasant 61 yo man with a h/o PAF, HTN, and dyslipidemia. He is still smoking. He has had multiple visits with atrial fib and a RVR associated with sob. He has reverted back to NSR. He has been placed on eliquis and both a beta blocker and a calcium channel blocker. He continues to smoke. In atrial fib he has palpitations and sob.  ?No Known Allergies ? ? ?Current Outpatient Medications  ?Medication Sig Dispense Refill  ? acetaminophen (TYLENOL) 325 MG tablet Take 650 mg by mouth every 6 (six) hours as needed for mild pain.    ? atorvastatin (LIPITOR) 40 MG tablet Take 1 tablet (40 mg total) by mouth daily. 90 tablet 3  ? diltiazem (CARDIZEM) 30 MG tablet Take 1 tablet (30 mg total) by mouth every 6 (six) hours as needed (palpitations). 30 tablet 1  ? empagliflozin (JARDIANCE) 10 MG TABS tablet Take 1 tablet (10 mg total) by mouth daily before breakfast. 90 tablet 3  ? esomeprazole (NEXIUM) 40 MG capsule Take 40 mg by mouth daily.  12  ? glimepiride (AMARYL) 1 MG tablet Take 1 mg by mouth every evening.    ? lisinopril (ZESTRIL) 10 MG tablet Take 1 tablet (10 mg total) by mouth daily. 30 tablet 1  ? metFORMIN (GLUCOPHAGE) 1000 MG tablet Take 1,000 mg by mouth 2 (two) times daily.  12  ? metoprolol succinate (TOPROL-XL) 50 MG 24 hr tablet Take 1 tablet (50 mg total) by mouth in the morning and at bedtime. Take with or immediately following a meal. 60 tablet 0  ? Multiple Vitamin (MULTIVITAMIN WITH MINERALS) TABS tablet Take 1 tablet by mouth daily.    ? apixaban (ELIQUIS) 5 MG TABS tablet Take 1 tablet (5 mg total) by mouth 2 (two) times daily. 60 tablet 0  ? ?No current facility-administered medications for this visit.  ? ? ? ?Past Medical History:  ?Diagnosis Date  ? Aortic atherosclerosis ([facility])   ? Aortic stenosis   ? Coronary artery calcification   ? Diabetes mellitus without complication ([place])   ? High cholesterol   ? Hypertension   ?  Mild dilation of ascending aorta ([facility])   ? by echo 07/2021 though not corroborated on CT  ? Mitral regurgitation   ? Neck mass   ? Abnormal neck CT 07/2021 concerning for nodal metastasis  ? PAF (paroxysmal atrial fibrillation) ([facility])   ? Pulmonary nodules   ? PVC's (premature ventricular contractions)   ? ? ?ROS: ? ? All systems reviewed and negative except as noted in the HPI. ? ? ?Past Surgical History:  ?Procedure Laterality Date  ? BACK SURGERY    ? ? ? ?Family History  ?Problem Relation Age of Onset  ? CAD Neg Hx   ?     negative for premature CAD  ? ? ? ?Social History  ? ?Socioeconomic History  ? Marital status: Married  ?  Spouse name: Not on file  ? Number of children: Not on file  ? Years of education: Not on file  ? Highest education level: Not on file  ?Occupational History  ? Not on file  ?Tobacco Use  ? Smoking status: Former  ?  Packs/day: 0.50  ?  Types: Cigarettes  ?  Quit date: 07/09/2021  ?  Years since quitting: 0.1  ? Smokeless tobacco: Not on file  ?Vaping Use  ? Vaping Use: Never used  ?  Substance and Sexual Activity  ? Alcohol use: No  ? Drug use: No  ? Sexual activity: Not on file  ?Other Topics Concern  ? Not on file  ?Social History Narrative  ? Not on file  ? ?Social Determinants of Health  ? ?Financial Resource Strain: Not on file  ?Food Insecurity: Not on file  ?Transportation Needs: Not on file  ?Physical Activity: Not on file  ?Stress: Not on file  ?Social Connections: Not on file  ?Intimate Partner Violence: Not on file  ? ? ? ?BP 130/64   Pulse 72   Ht '5\' 11"'$  (1.803 m)   Wt 198 lb 6.4 oz (90 kg)   SpO2 98%   BMI 27.67 kg/m?  ? ?Physical Exam: ? ?Well appearing NAD ?HEENT: Unremarkable ?Neck:  No JVD, no thyromegally ?Lymphatics:  No adenopathy ?Back:  No CVA tenderness ?Lungs:  Clear with no wheezes ?HEART:  Regular rate rhythm, no murmurs, no rubs, no clicks ?Abd:  soft, positive bowel sounds, no organomegally, no rebound, no guarding ?Ext:  2 plus pulses, no edema, no cyanosis, no  clubbing ?Skin:  No rashes no nodules ?Neuro:  CN II through XII intact, motor grossly intact ? ?EKG - nsr with bigeminal PVC's ? ?Assess/Plan ?PVC's - we will start low dose flecainide and titrate up as needed. ?PAF - he has had multiple episodes and we will start flecainide and continue the calcium and beta blocker for now. ?HTN - continue cardizem and toprol. ?Coags - he has not had any bleeding. We will follow.  ? ?Trevor Steele ? ?

## 2021-08-26 ENCOUNTER — Encounter: Payer: Self-pay | Admitting: Cardiology

## 2021-08-26 ENCOUNTER — Ambulatory Visit (HOSPITAL_COMMUNITY)
Admission: RE | Admit: 2021-08-26 | Discharge: 2021-08-26 | Disposition: A | Discharge status: Home / Self Care | Payer: Self-pay | Source: Ambulatory Visit | Attending: Cardiology | Admitting: Cardiology

## 2021-08-26 DIAGNOSIS — I429 Cardiomyopathy, unspecified: Secondary | ICD-10-CM | POA: Insufficient documentation

## 2021-08-27 ENCOUNTER — Encounter: Payer: Self-pay | Admitting: Cardiology

## 2021-08-27 ENCOUNTER — Telehealth: Payer: Self-pay | Admitting: Cardiology

## 2021-08-27 ENCOUNTER — Ambulatory Visit: Payer: BC Managed Care – PPO | Admitting: Orthopaedic Surgery

## 2021-08-27 DIAGNOSIS — E1169 Type 2 diabetes mellitus with other specified complication: Secondary | ICD-10-CM

## 2021-08-27 DIAGNOSIS — Z79899 Other long term (current) drug therapy: Secondary | ICD-10-CM

## 2021-08-27 NOTE — Telephone Encounter (Signed)
Cardiac ct results discussed with patient. He will have lipids/ALT drawn this week at [facility] ?

## 2021-08-27 NOTE — Telephone Encounter (Signed)
Patient was returning call for results. Please advise °

## 2021-09-06 ENCOUNTER — Ambulatory Visit (INDEPENDENT_AMBULATORY_CARE_PROVIDER_SITE_OTHER): Payer: BC Managed Care – PPO

## 2021-09-06 VITALS — BP 148/80 | HR 64 | Ht 71.5 in | Wt 198.0 lb

## 2021-09-06 DIAGNOSIS — Z79899 Other long term (current) drug therapy: Secondary | ICD-10-CM | POA: Diagnosis not present

## 2021-09-06 NOTE — Progress Notes (Signed)
Seen 08/23/21 by Dr.Taylor for A-fib with RVR. Started on Flecainide 50 mg twice a day. ? ? ? ?Nurse visit today, EKG done, sinus rhythm with PVC's, similar to EKG done on 08/22/21 ? ? ? ?Patient is without complaints, states he knows he is not in A-fib. ? ? ?He has f/u on 09/25/21 with M.Lenze, PA-C and August with Dr.Taylor. ? ? ? ?I will forward EKG to Dr.Taylor for review. ?

## 2021-09-06 NOTE — Patient Instructions (Signed)
Continue all medications as before. ? ? ? ?I will call you if Dr.Taylor has any new recommendations.  ? ? ? ? ?Thank you for choosing [facility] ! ? ? ? ? ? ? ?

## 2021-09-09 ENCOUNTER — Other Ambulatory Visit (HOSPITAL_COMMUNITY): Payer: Self-pay | Admitting: Otolaryngology

## 2021-09-09 DIAGNOSIS — C7989 Secondary malignant neoplasm of other specified sites: Secondary | ICD-10-CM

## 2021-09-11 ENCOUNTER — Telehealth: Payer: Self-pay | Admitting: Radiology

## 2021-09-11 ENCOUNTER — Encounter: Payer: Self-pay | Admitting: Radiology

## 2021-09-11 ENCOUNTER — Other Ambulatory Visit: Payer: Self-pay | Admitting: Orthopaedic Surgery

## 2021-09-11 MED ORDER — HYDROCODONE-ACETAMINOPHEN 5-325 MG PO TABS: 1.0000 | ORAL_TABLET | Freq: Four times a day (QID) | ORAL | 0 refills | Status: DC | PRN

## 2021-09-11 NOTE — Telephone Encounter (Signed)
Message sent back to patient, I do not see that Dr Luna Glasgow has prescribed this for patient.  I see Rx from Dr Rockne Menghini.  Advised patient we cannot ask another provider to fill (Dr Luna Glasgow is out of office) as Dr Luna Glasgow has not prescribed.  Advised him to refer back to Dr Rockne Menghini or discuss with Dr Luna Glasgow at his upcoming appt.  ?

## 2021-09-11 NOTE — Telephone Encounter (Signed)
Patient sent MyChart message for refill of hydrocodone.  ?

## 2021-09-11 NOTE — Progress Notes (Addendum)
Cardiology Office Note    Date:  09/25/2021   ID:  [place], Yearby [birth year], MRN [id number]   PCP:  [name], [facility]  Cardiologist:  Early Osmond, MD   Advanced Practice Provider:  No care team member to display Electrophysiologist:  None   [phone]   No chief complaint on file.   History of Present Illness:  Trevor Steele is a 61 y.o. male with history of hypertension, diabetes mellitus, coronary calcifications on CT, recurrent admissions with PAF with RVR recently started on flecainide by Dr. 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  Seen in ED 08/13/21 for Afib converted to NSR by Dr. Radford Pax. Given tobacco history and coronary calcification on chest CT calcium score 1183 she recommended lexiscan prior to neck surgery.  Patient recently saw Dr. Lovena Le and was started on flecainide for atrial fib and PVCs.  Patient on my schedule for preop clearance for laryngoscopy with biopsy and esophagoscopy by Dr. Ebbie Latus ENT 10/09/21 . Pharmacy has weighed in that he can hold Eliquis for 2 days prior to procedure. He's back to smoking at least 4-5 a day. Wife smokes in the home. No more Afib-knows when he has it. Has heartburn after coffee or meals. Denies chest tightness or pressure. Walks 3 miles/day-less recently because of fatigue from meds.   Past Medical History:  Diagnosis Date   [place]atston coronary artery calcium score greater than 400    coronary calcium score high at 1183 which is 48 percentile for age, race and sex matched controls.   Aortic atherosclerosis (HCC)    Aortic stenosis    Diabetes mellitus without complication (HCC)    High cholesterol    Hypertension    Mild dilation of ascending aorta ([place])    by echo 07/2021 though not corroborated on CT   Mitral regurgitation    Neck mass    Abnormal neck CT 07/2021 concerning for nodal metastasis   PAF  (paroxysmal atrial fibrillation) (HCC)    Pulmonary nodules    PVC's (premature ventricular contractions)     Past Surgical History:  Procedure Laterality Date   BACK SURGERY      Current Medications: Current Meds  Medication Sig   acetaminophen (TYLENOL) 325 MG tablet Take 650 mg by mouth every 6 (six) hours as needed for mild pain.   apixaban (ELIQUIS) 5 MG TABS tablet Take 1 tablet (5 mg total) by mouth 2 (two) times daily.   atorvastatin (LIPITOR) 40 MG tablet Take 1 tablet (40 mg total) by mouth daily.   diltiazem (CARDIZEM) 30 MG tablet Take 1 tablet (30 mg total) by mouth every 6 (six) hours as needed (palpitations).   empagliflozin (JARDIANCE) 10 MG TABS tablet Take 1 tablet (10 mg total) by mouth daily before breakfast.   esomeprazole (NEXIUM) 40 MG capsule Take 40 mg by mouth daily.   flecainide (TAMBOCOR) 50 MG tablet Take 1 tablet (50 mg total) by mouth 2 (two) times daily.   glimepiride (AMARYL) 1 MG tablet Take 1 mg by mouth every evening.   lisinopril (ZESTRIL) 10 MG tablet Take 1 tablet (10 mg total) by mouth daily.   metFORMIN (GLUCOPHAGE) 1000 MG tablet Take 1,000 mg by mouth 2 (two) times daily.   metoprolol succinate (TOPROL-XL) 50 MG 24 hr tablet Take 1 tablet (50 mg total) by mouth in the morning and at bedtime. Take with or immediately following  a meal.   Multiple Vitamin (MULTIVITAMIN WITH MINERALS) TABS tablet Take 1 tablet by mouth daily.     Allergies:   Patient has no known allergies.   Social History   Socioeconomic History   Marital status: Married    Spouse name: Not on file   Number of children: Not on file   Years of education: Not on file   Highest education level: Not on file  Occupational History   Not on file  Tobacco Use   Smoking status: Every Day    Packs/day: 0.50    Types: Cigarettes    Last attempt to quit: 07/09/2021    Years since quitting: 0.2   Smokeless tobacco: Not on file  Vaping Use   Vaping Use: Never used  Substance  and Sexual Activity   Alcohol use: No   Drug use: No   Sexual activity: Not on file  Other Topics Concern   Not on file  Social History Narrative   Not on file   Social Determinants of Health   Financial Resource Strain: Not on file  Food Insecurity: Not on file  Transportation Needs: Not on file  Physical Activity: Not on file  Stress: Not on file  Social Connections: Not on file     Family History:  The patient's  family history includes Coronary artery disease in his father and mother; Mesothelioma in his father.   ROS:   Please see the history of present illness.    ROS All other systems reviewed and are negative.   PHYSICAL EXAM:   VS:  BP 122/64   Pulse 62   Ht 5' 11.5" (1.816 m)   Wt 197 lb (89.4 kg)   SpO2 99%   BMI 27.09 kg/m   Physical Exam  GEN: Well nourished, well developed, in no acute distress, strong smell of cigarette smoke  Neck: no JVD, carotid bruits, or masses Cardiac:RRR; some skipping 2/6 systolic murmur LSB Respiratory:  decrease breath sound throughout. GI: soft, nontender, nondistended, + BS Ext: without cyanosis, clubbing, or edema, Good distal pulses bilaterally Neuro:  Alert and Oriented x 3,  Psych: euthymic mood, full affect  Wt Readings from Last 3 Encounters:  09/25/21 197 lb (89.4 kg)  09/06/21 198 lb (89.8 kg)  08/23/21 198 lb 6.4 oz (90 kg)      Studies/Labs Reviewed:   EKG:  EKG is not ordered today.    Recent Labs: 08/02/2021: TSH 0.481 08/12/2021: ALT 29; BUN 22; Creatinine, Ser 1.13; Hemoglobin 13.4; Platelets 212; Potassium 4.0; Sodium 138 08/13/2021: Magnesium 1.9   Lipid Panel No results found for: CHOL, TRIG, HDL, CHOLHDL, VLDL, LDLCALC, LDLDIRECT  Additional studies/ records that were reviewed today include:   NST 10/04/21    Findings are consistent with prior myocardial infarction with peri-infarct ischemia. The study is high risk.   Baseline EKG in ventricular bigeminy.  Unable to reach target heart rate  with exercise, switched to Lexiscan   No ST deviation was noted.   LV perfusion is abnormal. There is evidence of ischemia. There is evidence of infarction. Defect 1: There is a medium defect with moderate reduction in uptake present in the apical to basal anterior location(s) that is partially reversible. There is abnormal wall motion in the defect area. Consistent with peri-infarct ischemia. Defect 2: There is a medium defect with moderate reduction in uptake present in the apical to basal inferior location(s) that is fixed. There is abnormal wall motion in the defect area. Consistent with  infarction.   Left ventricular function is abnormal. End systolic cavity size is severely enlarged.   Poor quality study, significant extracardiac activity Severe LV systolic dysfunction, EF 51%.  Patient was having frequent PVCs however which can affect gating Fixed inferior perfusion defect with abnormal wall motion consistent with prior infarct Partially reversible anterior perfusion defect with abnormal wall motion suggesting infarct with peri-infarct ischemia High risk study   Coronary Calcium score 08/26/21  FINDINGS: Coronary arteries: Normal origins.   Coronary Calcium Score:   Left main: 220   Left anterior descending artery: 545   Left circumflex artery: 7   Right coronary artery: 411   Total: 1183   Percentile: 96th   Pericardium: Normal.   Ascending Aorta: Normal caliber.   Valves: AV calcium score 483   Non-cardiac: See separate report from [facility].   IMPRESSION: 1. Coronary calcium score of 1183. This was 96th percentile for age-, race-, and sex-matched controls.   2.  Aortic valve calcium score 483    Echocardiogram: 07/31/2021 IMPRESSIONS     1. Left ventricular ejection fraction, by estimation, is 45 to 50%. The  left ventricle has mildly decreased function. The left ventricle  demonstrates global hypokinesis. Left ventricular diastolic parameters are   consistent with Grade I diastolic  dysfunction (impaired relaxation).   2. Right ventricular systolic function is mildly reduced. The right  ventricular size is mildly enlarged. Tricuspid regurgitation signal is  inadequate for assessing PA pressure.   3. Left atrial size was mildly dilated.   4. The mitral valve is normal in structure. Mild mitral valve  regurgitation. No evidence of mitral stenosis.   5. The aortic valve is normal in structure. Aortic valve regurgitation is  mild. Mild to moderate aortic valve stenosis. Aortic valve area, by VTI  measures 1.73 cm. Aortic valve mean gradient measures 18.0 mmHg. Aortic  valve Vmax measures 2.82 m/s.   6. There is mild dilatation of the ascending aorta, measuring 38 mm.   7. The inferior vena cava is normal in size with greater than 50%  respiratory variability, suggesting right atrial pressure of 3 mmHg.   Comparison(s): No prior Echocardiogram.  PET scan IMPRESSION: 1. Asymmetric hypermetabolic activity in the deep LEFT base of tongue region concerning for primary squamous cell carcinoma. 2. 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 3. No additional evidence of squamous cell carcinoma metastasis. 4. 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 5. Benign adrenal adenomas.     Electronically Signed   By: Suzy Bouchard M.D.   On: 09/14/2021 10:29    Risk Assessment/Calculations:    CHA2DS2-VASc Score = 4   This indicates a 4.8% annual risk of stroke. The patient's score is based upon: CHF History: 1 HTN History: 1 Diabetes History: 1 Stroke History: 0 Vascular Disease History: 1 Age Score: 0 Gender Score: 0        ASSESSMENT:    1. PAF (paroxysmal atrial fibrillation) ([place])   2. PVC (premature ventricular contraction)   3. Essential hypertension   4. Coronary artery calcification seen on CAT scan   5. HFmrEF   6. Type 2 diabetes mellitus with  hyperosmolarity without coma, without long-term current use of insulin ([place])   7. Aortic valve stenosis, etiology of cardiac valve disease unspecified      PLAN:  In order of problems listed above:  Preop clearance for laryngoscopy with biopsy and esophagoscopy by Dr. Ebbie Latus ENT 10/09/21 . Pharmacy has  weighed in that he can hold Eliquis for 2 days prior to procedure. Patient with elevated coronary calcium score 1183, multiple CV risk factors including hypertension and tobacco abuse as well as diabetes mellitus.  Dr. Radford Pax who saw him in the emergency room 08/13/2021 recommended stress test prior to any surgery.  We will order exercise Myoview especially in light of being on flecainide.  Patient walks 3 miles a day without symptoms.  Addendum: NST is high risk for peri-infarct ischemia. Dr. Radford Pax reviewed and recommends LHC but patient needs above procedure so unless high risk lesion that needs PCI>>would treat medically. I called patient and went over above results. He agrees to cardiac cath.  I have reviewed the risks, indications, and alternatives to angioplasty and stenting with the patient. Risks include but are not limited to bleeding, infection, vascular injury, stroke, myocardial infection, arrhythmia, kidney injury, radiation-related injury in the case of prolonged fluoroscopy use, emergency cardiac surgery, and death. The patient understands the risks of serious complication is low (<0%) and patient agrees to proceed.    According to the Revised Cardiac Risk Index (RCRI), his Perioperative Risk of Major Cardiac Event is (%): 0.9  His Functional Capacity in METs is: 5.07 according to the [facility] Activity Status Index (DASI).    PAF on eliquis started on flecainide by Dr. Lovena Le 08/23/21.  EKG 09/06/2021 normal sinus rhythm with frequent PVCs has coronary calcifications on CT-GXT myoview-discussed with Dr. Lovena Le  PVC's-flecainide per Dr. 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   DM type2   Aortic stenosis mild to moderate  Shared Decision Making/Informed Consent   Shared Decision Making/Informed Consent The risks [chest pain, shortness of breath, cardiac arrhythmias, dizziness, blood pressure fluctuations, myocardial infarction, stroke/transient ischemic attack, nausea, vomiting, allergic reaction, radiation exposure, metallic taste sensation and life-threatening complications (estimated to be 1 in 10,000)], benefits (risk stratification, diagnosing coronary artery disease, treatment guidance) and alternatives of a nuclear stress test were discussed in detail with Trevor Steele and he agrees to proceed.    Medication Adjustments/Labs and Tests Ordered: Current medicines are reviewed at length with the patient today.  Concerns regarding medicines are outlined above.  Medication changes, Labs and Tests ordered today are listed in the Patient Instructions below. There are no Patient Instructions on file for this visit.   Sumner Boast, PA-C  09/25/2021 11:51 AM    [facility] [place], [place], [place]  [address] Phone: [phone]; Fax: [phone]

## 2021-09-11 NOTE — H&P (View-Only) (Signed)
Cardiology Office Note    Date:  09/25/2021   ID:  Trevor, Steele 1960/09/07, MRN [id number]   PCP:  [name], [facility]  Cardiologist:  Early Osmond, MD   Advanced Practice Provider:  No care team member to display Electrophysiologist:  None   [phone]   No chief complaint on file.   History of Present Illness:  Trevor Steele is a 61 y.o. male with history of hypertension, diabetes mellitus, coronary calcifications on CT, recurrent admissions with PAF with RVR recently started on flecainide by Dr. 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  Seen in ED 08/13/21 for Afib converted to NSR by Dr. Radford Pax. Given tobacco history and coronary calcification on chest CT calcium score 1183 she recommended lexiscan prior to neck surgery.  Patient recently saw Dr. Lovena Le and was started on flecainide for atrial fib and PVCs.  Patient on my schedule for preop clearance for laryngoscopy with biopsy and esophagoscopy by Dr. Ebbie Latus ENT 10/09/21 . Pharmacy has weighed in that he can hold Eliquis for 2 days prior to procedure. He's back to smoking at least 4-5 a day. Wife smokes in the home. No more Afib-knows when he has it. Has heartburn after coffee or meals. Denies chest tightness or pressure. Walks 3 miles/day-less recently because of fatigue from meds.   Past Medical History:  Diagnosis Date   Agatston coronary artery calcium score greater than 400    coronary calcium score high at 1183 which is 44 percentile for age, race and sex matched controls.   Aortic atherosclerosis (HCC)    Aortic stenosis    Diabetes mellitus without complication (HCC)    High cholesterol    Hypertension    Mild dilation of ascending aorta ([place])    by echo 07/2021 though not corroborated on CT   Mitral regurgitation    Neck mass    Abnormal neck CT 07/2021 concerning for nodal metastasis   PAF  (paroxysmal atrial fibrillation) (HCC)    Pulmonary nodules    PVC's (premature ventricular contractions)     Past Surgical History:  Procedure Laterality Date   BACK SURGERY      Current Medications: Current Meds  Medication Sig   acetaminophen (TYLENOL) 325 MG tablet Take 650 mg by mouth every 6 (six) hours as needed for mild pain.   apixaban (ELIQUIS) 5 MG TABS tablet Take 1 tablet (5 mg total) by mouth 2 (two) times daily.   atorvastatin (LIPITOR) 40 MG tablet Take 1 tablet (40 mg total) by mouth daily.   diltiazem (CARDIZEM) 30 MG tablet Take 1 tablet (30 mg total) by mouth every 6 (six) hours as needed (palpitations).   empagliflozin (JARDIANCE) 10 MG TABS tablet Take 1 tablet (10 mg total) by mouth daily before breakfast.   esomeprazole (NEXIUM) 40 MG capsule Take 40 mg by mouth daily.   flecainide (TAMBOCOR) 50 MG tablet Take 1 tablet (50 mg total) by mouth 2 (two) times daily.   glimepiride (AMARYL) 1 MG tablet Take 1 mg by mouth every evening.   lisinopril (ZESTRIL) 10 MG tablet Take 1 tablet (10 mg total) by mouth daily.   metFORMIN (GLUCOPHAGE) 1000 MG tablet Take 1,000 mg by mouth 2 (two) times daily.   metoprolol succinate (TOPROL-XL) 50 MG 24 hr tablet Take 1 tablet (50 mg total) by mouth in the morning and at bedtime. Take with or immediately following  a meal.   Multiple Vitamin (MULTIVITAMIN WITH MINERALS) TABS tablet Take 1 tablet by mouth daily.     Allergies:   Patient has no known allergies.   Social History   Socioeconomic History   Marital status: Married    Spouse name: Not on file   Number of children: Not on file   Years of education: Not on file   Highest education level: Not on file  Occupational History   Not on file  Tobacco Use   Smoking status: Every Day    Packs/day: 0.50    Types: Cigarettes    Last attempt to quit: 07/09/2021    Years since quitting: 0.2   Smokeless tobacco: Not on file  Vaping Use   Vaping Use: Never used  Substance  and Sexual Activity   Alcohol use: No   Drug use: No   Sexual activity: Not on file  Other Topics Concern   Not on file  Social History Narrative   Not on file   Social Determinants of Health   Financial Resource Strain: Not on file  Food Insecurity: Not on file  Transportation Needs: Not on file  Physical Activity: Not on file  Stress: Not on file  Social Connections: Not on file     Family History:  The patient's  family history includes Coronary artery disease in his father and mother; Mesothelioma in his father.   ROS:   Please see the history of present illness.    ROS All other systems reviewed and are negative.   PHYSICAL EXAM:   VS:  BP 122/64   Pulse 62   Ht 5' 11.5" (1.816 m)   Wt 197 lb (89.4 kg)   SpO2 99%   BMI 27.09 kg/m   Physical Exam  GEN: Well nourished, well developed, in no acute distress, strong smell of cigarette smoke  Neck: no JVD, carotid bruits, or masses Cardiac:RRR; some skipping 2/6 systolic murmur LSB Respiratory:  decrease breath sound throughout. GI: soft, nontender, nondistended, + BS Ext: without cyanosis, clubbing, or edema, Good distal pulses bilaterally Neuro:  Alert and Oriented x 3,  Psych: euthymic mood, full affect  Wt Readings from Last 3 Encounters:  09/25/21 197 lb (89.4 kg)  09/06/21 198 lb (89.8 kg)  08/23/21 198 lb 6.4 oz (90 kg)      Studies/Labs Reviewed:   EKG:  EKG is not ordered today.    Recent Labs: 08/02/2021: TSH 0.481 08/12/2021: ALT 29; BUN 22; Creatinine, Ser 1.13; Hemoglobin 13.4; Platelets 212; Potassium 4.0; Sodium 138 08/13/2021: Magnesium 1.9   Lipid Panel No results found for: CHOL, TRIG, HDL, CHOLHDL, VLDL, LDLCALC, LDLDIRECT  Additional studies/ records that were reviewed today include:   NST 10/04/21    Findings are consistent with prior myocardial infarction with peri-infarct ischemia. The study is high risk.   Baseline EKG in ventricular bigeminy.  Unable to reach target heart rate  with exercise, switched to Lexiscan   No ST deviation was noted.   LV perfusion is abnormal. There is evidence of ischemia. There is evidence of infarction. Defect 1: There is a medium defect with moderate reduction in uptake present in the apical to basal anterior location(s) that is partially reversible. There is abnormal wall motion in the defect area. Consistent with peri-infarct ischemia. Defect 2: There is a medium defect with moderate reduction in uptake present in the apical to basal inferior location(s) that is fixed. There is abnormal wall motion in the defect area. Consistent with  infarction.   Left ventricular function is abnormal. End systolic cavity size is severely enlarged.   Poor quality study, significant extracardiac activity Severe LV systolic dysfunction, EF 25%.  Patient was having frequent PVCs however which can affect gating Fixed inferior perfusion defect with abnormal wall motion consistent with prior infarct Partially reversible anterior perfusion defect with abnormal wall motion suggesting infarct with peri-infarct ischemia High risk study   Coronary Calcium score 08/26/21  FINDINGS: Coronary arteries: Normal origins.   Coronary Calcium Score:   Left main: 220   Left anterior descending artery: 545   Left circumflex artery: 7   Right coronary artery: 411   Total: 1183   Percentile: 96th   Pericardium: Normal.   Ascending Aorta: Normal caliber.   Valves: AV calcium score 483   Non-cardiac: See separate report from [facility].   IMPRESSION: 1. Coronary calcium score of 1183. This was 96th percentile for age-, race-, and sex-matched controls.   2.  Aortic valve calcium score 483    Echocardiogram: 07/31/2021 IMPRESSIONS     1. Left ventricular ejection fraction, by estimation, is 45 to 50%. The  left ventricle has mildly decreased function. The left ventricle  demonstrates global hypokinesis. Left ventricular diastolic parameters are   consistent with Grade I diastolic  dysfunction (impaired relaxation).   2. Right ventricular systolic function is mildly reduced. The right  ventricular size is mildly enlarged. Tricuspid regurgitation signal is  inadequate for assessing PA pressure.   3. Left atrial size was mildly dilated.   4. The mitral valve is normal in structure. Mild mitral valve  regurgitation. No evidence of mitral stenosis.   5. The aortic valve is normal in structure. Aortic valve regurgitation is  mild. Mild to moderate aortic valve stenosis. Aortic valve area, by VTI  measures 1.73 cm. Aortic valve mean gradient measures 18.0 mmHg. Aortic  valve Vmax measures 2.82 m/s.   6. There is mild dilatation of the ascending aorta, measuring 38 mm.   7. The inferior vena cava is normal in size with greater than 50%  respiratory variability, suggesting right atrial pressure of 3 mmHg.   Comparison(s): No prior Echocardiogram.  PET scan IMPRESSION: 1. Asymmetric hypermetabolic activity in the deep LEFT base of tongue region concerning for primary squamous cell carcinoma. 2. 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 3. No additional evidence of squamous cell carcinoma metastasis. 4. 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 5. Benign adrenal adenomas.     Electronically Signed   By: Suzy Bouchard M.D.   On: 09/14/2021 10:29    Risk Assessment/Calculations:    CHA2DS2-VASc Score = 4   This indicates a 4.8% annual risk of stroke. The patient's score is based upon: CHF History: 1 HTN History: 1 Diabetes History: 1 Stroke History: 0 Vascular Disease History: 1 Age Score: 0 Gender Score: 0        ASSESSMENT:    1. PAF (paroxysmal atrial fibrillation) ([place])   2. PVC (premature ventricular contraction)   3. Essential hypertension   4. Coronary artery calcification seen on CAT scan   5. HFmrEF   6. Type 2 diabetes mellitus with  hyperosmolarity without coma, without long-term current use of insulin ([place])   7. Aortic valve stenosis, etiology of cardiac valve disease unspecified      PLAN:  In order of problems listed above:  Preop clearance for laryngoscopy with biopsy and esophagoscopy by Dr. Ebbie Latus ENT 10/09/21 . Pharmacy has  weighed in that he can hold Eliquis for 2 days prior to procedure. Patient with elevated coronary calcium score 1183, multiple CV risk factors including hypertension and tobacco abuse as well as diabetes mellitus.  Dr. Radford Pax who saw him in the emergency room 08/13/2021 recommended stress test prior to any surgery.  We will order exercise Myoview especially in light of being on flecainide.  Patient walks 3 miles a day without symptoms.  Addendum: NST is high risk for peri-infarct ischemia. Dr. Radford Pax reviewed and recommends LHC but patient needs above procedure so unless high risk lesion that needs PCI>>would treat medically. I called patient and went over above results. He agrees to cardiac cath.  I have reviewed the risks, indications, and alternatives to angioplasty and stenting with the patient. Risks include but are not limited to bleeding, infection, vascular injury, stroke, myocardial infection, arrhythmia, kidney injury, radiation-related injury in the case of prolonged fluoroscopy use, emergency cardiac surgery, and death. The patient understands the risks of serious complication is low (<0%) and patient agrees to proceed.    According to the Revised Cardiac Risk Index (RCRI), his Perioperative Risk of Major Cardiac Event is (%): 0.9  His Functional Capacity in METs is: 5.07 according to the [facility] Activity Status Index (DASI).    PAF on eliquis started on flecainide by Dr. Lovena Le 08/23/21.  EKG 09/06/2021 normal sinus rhythm with frequent PVCs has coronary calcifications on CT-GXT myoview-discussed with Dr. Lovena Le  PVC's-flecainide per Dr. 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   DM type2   Aortic stenosis mild to moderate  Shared Decision Making/Informed Consent   Shared Decision Making/Informed Consent The risks [chest pain, shortness of breath, cardiac arrhythmias, dizziness, blood pressure fluctuations, myocardial infarction, stroke/transient ischemic attack, nausea, vomiting, allergic reaction, radiation exposure, metallic taste sensation and life-threatening complications (estimated to be 1 in 10,000)], benefits (risk stratification, diagnosing coronary artery disease, treatment guidance) and alternatives of a nuclear stress test were discussed in detail with Trevor Steele and he agrees to proceed.    Medication Adjustments/Labs and Tests Ordered: Current medicines are reviewed at length with the patient today.  Concerns regarding medicines are outlined above.  Medication changes, Labs and Tests ordered today are listed in the Patient Instructions below. There are no Patient Instructions on file for this visit.   Sumner Boast, PA-C  09/25/2021 11:51 AM    [facility] [address] [place], [place], [place]  [address] Phone: [phone]; Fax: [phone]

## 2021-09-12 ENCOUNTER — Ambulatory Visit (HOSPITAL_COMMUNITY)
Admission: RE | Admit: 2021-09-12 | Discharge: 2021-09-12 | Disposition: A | Discharge status: Home / Self Care | Payer: BC Managed Care – PPO | Source: Ambulatory Visit | Attending: Otolaryngology | Admitting: Otolaryngology

## 2021-09-12 DIAGNOSIS — C7989 Secondary malignant neoplasm of other specified sites: Secondary | ICD-10-CM | POA: Diagnosis not present

## 2021-09-12 DIAGNOSIS — C801 Malignant (primary) neoplasm, unspecified: Secondary | ICD-10-CM | POA: Insufficient documentation

## 2021-09-12 DIAGNOSIS — C4492 Squamous cell carcinoma of skin, unspecified: Secondary | ICD-10-CM | POA: Diagnosis not present

## 2021-09-12 MED ORDER — FLUDEOXYGLUCOSE F - 18 (FDG) INJECTION
10.6800 | Freq: Once | INTRAVENOUS | Status: AC | PRN
  Administered 2021-09-12: 10.68 via INTRAVENOUS

## 2021-09-17 DIAGNOSIS — Z125 Encounter for screening for malignant neoplasm of prostate: Secondary | ICD-10-CM | POA: Diagnosis not present

## 2021-09-17 DIAGNOSIS — I480 Paroxysmal atrial fibrillation: Secondary | ICD-10-CM | POA: Diagnosis not present

## 2021-09-17 DIAGNOSIS — E785 Hyperlipidemia, unspecified: Secondary | ICD-10-CM | POA: Diagnosis not present

## 2021-09-17 DIAGNOSIS — Z79899 Other long term (current) drug therapy: Secondary | ICD-10-CM | POA: Diagnosis not present

## 2021-09-17 DIAGNOSIS — E118 Type 2 diabetes mellitus with unspecified complications: Secondary | ICD-10-CM | POA: Diagnosis not present

## 2021-09-20 ENCOUNTER — Other Ambulatory Visit: Payer: Self-pay | Admitting: Otolaryngology

## 2021-09-20 ENCOUNTER — Telehealth: Payer: Self-pay | Admitting: Internal Medicine

## 2021-09-20 DIAGNOSIS — R7309 Other abnormal glucose: Secondary | ICD-10-CM | POA: Diagnosis not present

## 2021-09-20 DIAGNOSIS — E1169 Type 2 diabetes mellitus with other specified complication: Secondary | ICD-10-CM | POA: Diagnosis not present

## 2021-09-20 DIAGNOSIS — I480 Paroxysmal atrial fibrillation: Secondary | ICD-10-CM | POA: Diagnosis not present

## 2021-09-20 DIAGNOSIS — E785 Hyperlipidemia, unspecified: Secondary | ICD-10-CM | POA: Diagnosis not present

## 2021-09-20 NOTE — Telephone Encounter (Signed)
   Patient Name: Trevor Steele  DOB: Nov 03, 1960 MRN: [id number]  Primary Cardiologist: Early Osmond, MD  Chart reviewed as part of pre-operative protocol coverage.   The patient already has an upcoming appointment scheduled 09/25/21 with Ermalinda Barrios PA-C at which time this clearance should be addressed. (Procedure date of 10/09/21 falls after appointment.)  - Will route to pharm for Eliquis so this is available to [place] at time of visit.  - I added "preop" comment to appointment notes so that provider is aware to address at time of OV.   - Will fax update to requesting surgeon so they are aware appt has been scheduled. Will remove from preop box as separate preop APP input not necessary at this time.  Charlie Pitter, PA-C 09/20/2021, 4:55 PM

## 2021-09-20 NOTE — Telephone Encounter (Signed)
   Pre-operative Risk Assessment    Patient Name: Trevor Steele  DOB: 02/18/1961 MRN: [id number]      Request for Surgical Clearance    Procedure:  Direct Laryngoscopy with biopsy and esophagoscopy , possible tonsillectomy and frozen section   Date of Surgery:  Clearance 10/09/21                                 Surgeon:  Dr. Orie Rout  Surgeon's Group or Practice Name:  [facility] Phone number:  [phone] Fax number:  [phone] Attn: Angela Nevin    Type of Clearance Requested:   - Medical  - Pharmacy:  Hold Blood Thinner (ENT not sure which blood thinner he is on)  how many days prior to procedure and when to resume post surgery   Type of Anesthesia:  General    Additional requests/questions:    [name]   09/20/2021, 10:51 AM

## 2021-09-23 NOTE — Telephone Encounter (Signed)
Patient with diagnosis of afib on Eliquis for anticoagulation.    Procedure: Direct Laryngoscopy with biopsy and esophagoscopy , possible tonsillectomy and frozen section  Date of procedure: 10/09/21  CHA2DS2-VASc Score = 4  This indicates a 4.8% annual risk of stroke. The patient's score is based upon: CHF History: 1 HTN History: 1 Diabetes History: 1 Stroke History: 0 Vascular Disease History: 1 Age Score: 0 Gender Score: 0   CrCl 68m/min Platelet count 212K  Per office protocol, patient can hold Eliquis for 2 days prior to procedure. Pt should resume anticoagulation within 1-2 days post op if able.

## 2021-09-25 ENCOUNTER — Encounter: Payer: Self-pay | Admitting: Physician Assistant

## 2021-09-25 ENCOUNTER — Ambulatory Visit (INDEPENDENT_AMBULATORY_CARE_PROVIDER_SITE_OTHER): Payer: BC Managed Care – PPO | Admitting: Physician Assistant

## 2021-09-25 VITALS — BP 122/64 | HR 62 | Ht 71.5 in | Wt 197.0 lb

## 2021-09-25 DIAGNOSIS — I2510 Atherosclerotic heart disease of native coronary artery without angina pectoris: Secondary | ICD-10-CM | POA: Diagnosis not present

## 2021-09-25 DIAGNOSIS — I480 Paroxysmal atrial fibrillation: Secondary | ICD-10-CM | POA: Diagnosis not present

## 2021-09-25 DIAGNOSIS — I350 Nonrheumatic aortic (valve) stenosis: Secondary | ICD-10-CM

## 2021-09-25 DIAGNOSIS — E1100 Type 2 diabetes mellitus with hyperosmolarity without nonketotic hyperglycemic-hyperosmolar coma (NKHHC): Secondary | ICD-10-CM

## 2021-09-25 DIAGNOSIS — I10 Essential (primary) hypertension: Secondary | ICD-10-CM

## 2021-09-25 DIAGNOSIS — I493 Ventricular premature depolarization: Secondary | ICD-10-CM | POA: Diagnosis not present

## 2021-09-25 DIAGNOSIS — I5021 Acute systolic (congestive) heart failure: Secondary | ICD-10-CM

## 2021-09-25 DIAGNOSIS — Z01818 Encounter for other preprocedural examination: Secondary | ICD-10-CM

## 2021-09-25 NOTE — Patient Instructions (Signed)
Medication Instructions:  Your physician recommends that you continue on your current medications as directed. Please refer to the Current Medication list given to you today.   Labwork: None today  Testing/Procedures: Your physician has requested that you have en exercise stress myoview. For further information please visit [email or web address]. Please follow instruction sheet, as given.   Follow-Up: 3-4 months Dr.Branch  Any Other Special Instructions Will Be Listed Below (If Applicable).  If you need a refill on your cardiac medications before your next appointment, please call your pharmacy.

## 2021-10-03 ENCOUNTER — Encounter (HOSPITAL_COMMUNITY): Payer: Self-pay

## 2021-10-03 ENCOUNTER — Encounter (HOSPITAL_COMMUNITY): Payer: Self-pay | Admitting: Physician Assistant

## 2021-10-03 ENCOUNTER — Ambulatory Visit (HOSPITAL_BASED_OUTPATIENT_CLINIC_OR_DEPARTMENT_OTHER)
Admission: RE | Admit: 2021-10-03 | Discharge: 2021-10-03 | Disposition: A | Discharge status: Home / Self Care | Payer: BC Managed Care – PPO | Source: Ambulatory Visit | Attending: Physician Assistant | Admitting: Physician Assistant

## 2021-10-03 ENCOUNTER — Ambulatory Visit (HOSPITAL_COMMUNITY)
Admission: RE | Admit: 2021-10-03 | Discharge: 2021-10-03 | Disposition: A | Discharge status: Home / Self Care | Payer: BC Managed Care – PPO | Source: Ambulatory Visit | Attending: Physician Assistant | Admitting: Physician Assistant

## 2021-10-03 DIAGNOSIS — I5021 Acute systolic (congestive) heart failure: Secondary | ICD-10-CM

## 2021-10-03 DIAGNOSIS — I2510 Atherosclerotic heart disease of native coronary artery without angina pectoris: Secondary | ICD-10-CM | POA: Insufficient documentation

## 2021-10-03 DIAGNOSIS — Z01818 Encounter for other preprocedural examination: Secondary | ICD-10-CM | POA: Diagnosis not present

## 2021-10-03 MED ORDER — HYDROCODONE-ACETAMINOPHEN 5-325 MG PO TABS: 1.0000 | ORAL_TABLET | Freq: Four times a day (QID) | ORAL | 0 refills | Status: AC | PRN

## 2021-10-03 MED ORDER — REGADENOSON 0.4 MG/5ML IV SOLN
INTRAVENOUS | Status: AC
  Administered 2021-10-03: 0.4 mg via INTRAVENOUS
  Filled 2021-10-03: qty 5

## 2021-10-03 MED ORDER — SODIUM CHLORIDE FLUSH 0.9 % IV SOLN
INTRAVENOUS | Status: AC
  Administered 2021-10-03: 10 mL via INTRAVENOUS
  Filled 2021-10-03: qty 10

## 2021-10-03 MED ORDER — TECHNETIUM TC 99M TETROFOSMIN IV KIT
30.0000 | PACK | Freq: Once | INTRAVENOUS | Status: AC | PRN
  Administered 2021-10-03: 31 via INTRAVENOUS

## 2021-10-03 MED ORDER — TECHNETIUM TC 99M TETROFOSMIN IV KIT
10.0000 | PACK | Freq: Once | INTRAVENOUS | Status: AC | PRN
  Administered 2021-10-03: 11 via INTRAVENOUS

## 2021-10-03 NOTE — Addendum Note (Signed)
Addended by: Larena Glassman A on: 10/03/2021 09:03 PM   Modules accepted: Orders

## 2021-10-04 LAB — NM MYOCAR MULTI W/SPECT W/WALL MOTION / EF
Angina Index: 0
LV dias vol: 151 mL (ref 62–150)
LV sys vol: 116 mL
Nuc Stress EF: 23 %
Peak HR: 107 {beats}/min
RATE: 0.8
Rest HR: 68 {beats}/min
Rest Nuclear Isotope Dose: 11 mCi
SDS: 2
SRS: 1
SSS: 3
ST Depression (mm): 0 mm
Stress Nuclear Isotope Dose: 31 mCi
TID: 0.95

## 2021-10-07 ENCOUNTER — Other Ambulatory Visit: Payer: Self-pay

## 2021-10-07 ENCOUNTER — Encounter (HOSPITAL_COMMUNITY): Payer: Self-pay | Admitting: Otolaryngology

## 2021-10-07 ENCOUNTER — Telehealth: Payer: Self-pay | Admitting: Internal Medicine

## 2021-10-07 DIAGNOSIS — I2510 Atherosclerotic heart disease of native coronary artery without angina pectoris: Secondary | ICD-10-CM

## 2021-10-07 DIAGNOSIS — I480 Paroxysmal atrial fibrillation: Secondary | ICD-10-CM

## 2021-10-07 NOTE — Telephone Encounter (Signed)
Pt returning nurses call regarding test results. Please advise 

## 2021-10-07 NOTE — Telephone Encounter (Signed)
Pt aware and has already started holding Eliquis as well as has directions to hold diabetic meds prior to sx . Is it  okay to go ahead and schedule cath this week possibly for Wednesday since pt has already started to hold Eliquis ./cy

## 2021-10-07 NOTE — Telephone Encounter (Signed)
Imogene Burn, PA-C  10/07/2021 11:21 AM EDT     Stress test is high risk for ischemia. Dr. Radford Pax who saw him in the hospital reviewed it and recommends heart cath before undergoing surgery. I tried calling patient but had to leave a message to call you back at the office. I'm happy to speak to him as well. In the meantime he will have to cancel his surgery 10/09/21. thanks        Encounter-Level Documents on 10/03/2021:  Electronic signature on 10/03/2021 7:40 AM - 1 of 3 e-signatures recorded Electronic signature on 10/01/2021 1:35 PM - 1 of 3 e-signatures recorded

## 2021-10-07 NOTE — Progress Notes (Signed)
Pt called around 1250 PM today stating that the cardiology office called him and told him he had to have a heart cath done before proceeding with surgery. I told him he needed to call Dr. Dorathy Kinsman office and notify them. I gave him the number, when he heard the number he told he can never get through to them. I told him to try but that I would also call and notify them. He stated he would try and if not would go online to send them the message. I called Benita Stabile, surgery scheduler for Dr. Fredric Dine and she took the info and found the note written today by Ermalinda Barrios PA that confirmed what I had told her. She states she will make sure Dr. Fredric Dine gets this information

## 2021-10-07 NOTE — Progress Notes (Signed)
Anesthesia Chart Review: SAME DAY WORK-UP  Case: [id number] Date/Time: 10/09/21 1015   Procedures:      DIRECT LARYNGOSCOPY WITH BIOPSY; ESOPHAGOSCOPY     TONSILLECTOMY; FROZEN SECTION (Bilateral)   Anesthesia type: General   Pre-op diagnosis: Neck mass   Location: [facility] OR ROOM 08 / [facility]   Surgeons: Skotnicki, Meghan A, DO       DISCUSSION: Patient is a 61 year old male scheduled for the above procedure.   History includes former smoker (quit 07/09/21), HTN, DM2, hypercholesterolemia, dysrhythmia (PAF diagnosed 06/28/21; PVCs), elevated Coronary calcium (08/2021), aortic stenosis (mild-moderate AS, mild AI, 38 mm ascending aorta 07/2021 echo), mitral regurgitation (mild MR 07/2021 echo), GERD, pulmonary nodules (5.9 mm pulmonary nodules 12/04/20 lung cancer screening chest CT; no hypermetabolic mediastinal or hilar nodes, no suspicious pulmonary nodules on PET CT 09/12/21), spinal surgery.   Last cardiology visit 09/25/21 with Ermalinda Barrios, PA-C. Preoperative stress test ordered given multiple CAD risk factors, elevated coronary calcium, and being on flecainide. [place] recommendations: Hold Eliquis for 2 days prior to surgery, resume 1-2 days postoperative if able. Since then, Mr. Toso had a high risk stress test (see below CV). Per Ermalinda Barrios, "Stress test is high risk for ischemia. Dr. Radford Pax who saw him in the hospital reviewed it and recommends heart cath before undergoing surgery...he will have to cancel his surgery 10/09/21." It appears cardiology has informed Mr. Pultz, and he is hoping to get cardiac cath scheduled this week, as Eliquis already on hold (last dose 10/06/21).    VS:  BP Readings from Last 3 Encounters:  09/25/21 122/64  09/06/21 (!) 148/80  08/23/21 130/64   Pulse Readings from Last 3 Encounters:  09/25/21 62  09/06/21 64  08/23/21 72     PROVIDERS: [name], MD is PCP  Lenna Sciara, MD is cardiologist Cristopher Peru, MD is EP cardiologist   LABS: For  day of surgery as indicated. As of 08/12/21, Cr 1.130, CBC, AST, ALT normal. A1c 6.8% 08/02/21.    IMAGES: PET Scan 09/12/21: IMPRESSION: 1. Asymmetric hypermetabolic activity in the deep LEFT base of tongue region concerning for primary squamous cell carcinoma. 2. 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 3. No additional evidence of squamous cell carcinoma metastasis. 4. 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 5. Benign adrenal adenomas.   1V PCXR 08/12/21: IMPRESSION: Chronic appearing increased lung markings with mild bibasilar atelectasis.  CT Chest/abd/pelvis 08/03/21: IMPRESSION: 1. Stable emphysema and small pulmonary nodules in the chest. 2. Atherosclerosis of the thoracic and abdominal aorta and coronary atherosclerosis. No evidence of aortic aneurysmal disease. 3. Stable 2.8 cm left adrenal mass. This is stable since 2017 and therefore consistent with benign adenoma. 4. No evidence of primary malignancy in the chest, abdomen or pelvis.   CT Soft tissue neck 08/02/21: IMPRESSION: Enlarged, centrally necrotic left level 2A lymph nodes, which are concerning for nodal metastasis, without a definite primary in the neck. Recommend ENT consultation and sampling.    EKG: 09/06/21: Sinus rhythm with occasional PVCs and possible PACs with aberrant conduction.   CV: Nuclear stress test 10/03/21:   Findings are consistent with prior myocardial infarction with peri-infarct ischemia. The study is high risk.   Baseline EKG in ventricular bigeminy.  Unable to reach target heart rate with exercise, switched to Lexiscan   No ST deviation was noted.   LV perfusion is abnormal. There is evidence of ischemia. There is evidence of infarction. Defect 1:  There is a medium defect with moderate reduction in uptake present in the apical to basal anterior location(s) that is partially reversible. There is abnormal wall motion in the defect area.  Consistent with peri-infarct ischemia. Defect 2: There is a medium defect with moderate reduction in uptake present in the apical to basal inferior location(s) that is fixed. There is abnormal wall motion in the defect area. Consistent with infarction.   Left ventricular function is abnormal. End systolic cavity size is severely enlarged.   Poor quality study, significant extracardiac activity Severe LV systolic dysfunction, EF 57%.  Patient was having frequent PVCs however which can affect gating Fixed inferior perfusion defect with abnormal wall motion consistent with prior infarct Partially reversible anterior perfusion defect with abnormal wall motion suggesting infarct with peri-infarct ischemia High risk study   CT Cardiac Scoring 08/26/21: IMPRESSION: 1. Coronary calcium score of 1183. This was 96th percentile for age-, race-, and sex-matched controls. 2.  Aortic valve calcium score 483   Echo 07/31/21: IMPRESSIONS   1. Left ventricular ejection fraction, by estimation, is 45 to 50%. The  left ventricle has mildly decreased function. The left ventricle  demonstrates global hypokinesis. Left ventricular diastolic parameters are  consistent with Grade I diastolic  dysfunction (impaired relaxation).   2. Right ventricular systolic function is mildly reduced. The right  ventricular size is mildly enlarged. Tricuspid regurgitation signal is  inadequate for assessing PA pressure.   3. Left atrial size was mildly dilated.   4. The mitral valve is normal in structure. Mild mitral valve  regurgitation. No evidence of mitral stenosis.   5. The aortic valve is normal in structure. Aortic valve regurgitation is  mild. Mild to moderate aortic valve stenosis. Aortic valve area, by VTI  measures 1.73 cm. Aortic valve mean gradient measures 18.0 mmHg. Aortic  valve Vmax measures 2.82 m/s.   6. There is mild dilatation of the ascending aorta, measuring 38 mm.   7. The inferior vena cava is  normal in size with greater than 50%  respiratory variability, suggesting right atrial pressure of 3 mmHg.  - Comparison(s): No prior Echocardiogram.   Past Medical History:  Diagnosis Date   Agatston coronary artery calcium score greater than 400    coronary calcium score high at 1183 which is 72 percentile for age, race and sex matched controls.   Aortic atherosclerosis (HCC)    Aortic stenosis    COVID 06/28/2021   no symptoms, went to hospital with a-fib and diagnosed then   Diabetes mellitus without complication ([place])    Dysrhythmia    A-fib   GERD (gastroesophageal reflux disease)    High cholesterol    History of kidney stones    Hypertension    Mild dilation of ascending aorta ([place])    by echo 07/2021 though not corroborated on CT   Mitral regurgitation    Neck mass    Abnormal neck CT 07/2021 concerning for nodal metastasis   PAF (paroxysmal atrial fibrillation) (HCC)    Pneumonia    Pulmonary nodules    PVC's (premature ventricular contractions)     Past Surgical History:  Procedure Laterality Date   BACK SURGERY     BACK SURGERY     lumbar    MEDICATIONS: No current facility-administered medications for this encounter.    acetaminophen (TYLENOL) 500 MG tablet   apixaban (ELIQUIS) 5 MG TABS tablet   atorvastatin (LIPITOR) 40 MG tablet   diltiazem (CARDIZEM) 30 MG tablet   empagliflozin (  JARDIANCE) 10 MG TABS tablet   esomeprazole (NEXIUM) 40 MG capsule   flecainide (TAMBOCOR) 50 MG tablet   glimepiride (AMARYL) 1 MG tablet   lisinopril (ZESTRIL) 10 MG tablet   metFORMIN (GLUCOPHAGE) 1000 MG tablet   metoprolol succinate (TOPROL-XL) 50 MG 24 hr tablet   Multiple Vitamin (MULTIVITAMIN WITH MINERALS) TABS tablet   HYDROcodone-acetaminophen (NORCO/VICODIN) 5-325 MG tablet    Myra Gianotti, PA-C Surgical Short Stay/Anesthesiology [facility] Phone [phone] [facility] Phone [phone] 10/07/2021 1:01 PM

## 2021-10-07 NOTE — Telephone Encounter (Signed)
I called pt and went over Cath Instructions, Date & Time. He understands all of the above and agrees to all. He is scheduled for 10/09/21 @ 8:30am with Dr. Ernest Pine.  I have sent him a letter with instructions and walked him through his MyChart on how to find them.  He will go to [facility] in [place] on 10/08/21 for labwork.

## 2021-10-07 NOTE — Progress Notes (Signed)
Spoke with pt for pre-op call. Pt has recent hx of A-fib (diagnosed in February 2023). Pt is on Eliquis, last dose was 10/06/21 PM dose - was instructed to hold it 2 days prior to procedure by cardiologist.  Pt is a type 2 Diabetic. Last A1C was 6.8 on 08/02/21. He states his fasting blood sugar is usually between 140-160. Pt instructed to hold Jardiance Tuesday and Wednesday, hold evening dose of Glimepiride on Tuesday and not to take Metformin the day of surgery. Instructed pt to check his blood sugar when he wakes up Wednesday AM and every 2 hours until he leaves for the hospital. If blood sugar is 70 or below, treat with 1/2 cup of clear juice (apple or cranberry) and recheck blood sugar 15 minutes after drinking juice. If blood sugar continues to be 70 or below, call the Short Stay department and ask to speak to a nurse. Pt voiced understanding.  Shower instructions given to pt.   Chart sent to Anesthesia PA. To follow-up about cardiac clearance.

## 2021-10-08 ENCOUNTER — Telehealth: Payer: Self-pay | Admitting: *Deleted

## 2021-10-08 DIAGNOSIS — I480 Paroxysmal atrial fibrillation: Secondary | ICD-10-CM | POA: Diagnosis not present

## 2021-10-08 DIAGNOSIS — I2510 Atherosclerotic heart disease of native coronary artery without angina pectoris: Secondary | ICD-10-CM | POA: Diagnosis not present

## 2021-10-08 LAB — CBC
Hematocrit: 43.7 % (ref 37.5–51.0)
Hemoglobin: 14.7 g/dL (ref 13.0–17.7)
MCH: 30.9 pg (ref 26.6–33.0)
MCHC: 33.6 g/dL (ref 31.5–35.7)
MCV: 92 fL (ref 79–97)
Platelets: 234 10*3/uL (ref 150–450)
RBC: 4.76 x10E6/uL (ref 4.14–5.80)
RDW: 12.9 % (ref 11.6–15.4)
WBC: 10 10*3/uL (ref 3.4–10.8)

## 2021-10-08 MED ORDER — SODIUM CHLORIDE 0.9% FLUSH: 3.0000 mL | Freq: Two times a day (BID) | INTRAVENOUS | Status: AC

## 2021-10-08 NOTE — Telephone Encounter (Signed)
Cardiac Catheterization scheduled at [facility] for: Wednesday October 09, 2021 8:30 AM Arrival time and place: [place] Entrance A at: 6:30 AM   Nothing to eat after midnight prior to procedure, clear liquids until 5 AM day of procedure.  Medication instructions: -Hold:  Eliquis-none 10/07/21 until post procedure  Metformin-day of procedure and 48 hours post procedure  Jardiance-AM of procedure (takes glimepiride before evening meal).  -Except hold medications usual morning medications can be taken with sips of water including aspirin 81 mg.  Confirmed patient has responsible adult to drive home post procedure and be with patient first 24 hours after arriving home.  Patient reports no new symptoms concerning for COVID-19/no exposure to COVID-19 in the past 10 days.  Reviewed procedure instructions with patient.

## 2021-10-08 NOTE — Addendum Note (Signed)
Addended by: Imogene Burn on: 10/08/2021 08:32 AM   Modules accepted: Orders

## 2021-10-09 ENCOUNTER — Ambulatory Visit (HOSPITAL_COMMUNITY)
Admission: RE | Admit: 2021-10-09 | Discharge: 2021-10-09 | Disposition: A | Discharge status: Home / Self Care | Payer: BC Managed Care – PPO | Attending: Cardiovascular Disease | Admitting: Cardiovascular Disease

## 2021-10-09 ENCOUNTER — Other Ambulatory Visit: Payer: Self-pay

## 2021-10-09 ENCOUNTER — Encounter (HOSPITAL_COMMUNITY): Payer: Self-pay | Admitting: Cardiovascular Disease

## 2021-10-09 ENCOUNTER — Encounter (HOSPITAL_COMMUNITY)
Admission: RE | Disposition: A | Discharge status: Home / Self Care | Payer: Self-pay | Source: Home / Self Care | Attending: Cardiovascular Disease

## 2021-10-09 DIAGNOSIS — Z7984 Long term (current) use of oral hypoglycemic drugs: Secondary | ICD-10-CM | POA: Diagnosis not present

## 2021-10-09 DIAGNOSIS — I480 Paroxysmal atrial fibrillation: Secondary | ICD-10-CM | POA: Insufficient documentation

## 2021-10-09 DIAGNOSIS — Z7901 Long term (current) use of anticoagulants: Secondary | ICD-10-CM | POA: Diagnosis not present

## 2021-10-09 DIAGNOSIS — E119 Type 2 diabetes mellitus without complications: Secondary | ICD-10-CM | POA: Insufficient documentation

## 2021-10-09 DIAGNOSIS — I082 Rheumatic disorders of both aortic and tricuspid valves: Secondary | ICD-10-CM | POA: Diagnosis not present

## 2021-10-09 DIAGNOSIS — R9439 Abnormal result of other cardiovascular function study: Secondary | ICD-10-CM

## 2021-10-09 DIAGNOSIS — I2510 Atherosclerotic heart disease of native coronary artery without angina pectoris: Secondary | ICD-10-CM | POA: Diagnosis not present

## 2021-10-09 DIAGNOSIS — I5022 Chronic systolic (congestive) heart failure: Secondary | ICD-10-CM | POA: Diagnosis not present

## 2021-10-09 DIAGNOSIS — I493 Ventricular premature depolarization: Secondary | ICD-10-CM | POA: Insufficient documentation

## 2021-10-09 DIAGNOSIS — F17210 Nicotine dependence, cigarettes, uncomplicated: Secondary | ICD-10-CM | POA: Insufficient documentation

## 2021-10-09 DIAGNOSIS — I110 Hypertensive heart disease with heart failure: Secondary | ICD-10-CM | POA: Diagnosis not present

## 2021-10-09 HISTORY — DX: Pneumonia, unspecified organism: J18.9

## 2021-10-09 HISTORY — DX: Personal history of urinary calculi: Z87.442

## 2021-10-09 HISTORY — DX: Gastro-esophageal reflux disease without esophagitis: K21.9

## 2021-10-09 HISTORY — PX: LEFT HEART CATH AND CORONARY ANGIOGRAPHY: CATH118249

## 2021-10-09 HISTORY — DX: Cardiac arrhythmia, unspecified: I49.9

## 2021-10-09 LAB — GLUCOSE, CAPILLARY: Glucose-Capillary: 167 mg/dL — ABNORMAL HIGH (ref 70–99)

## 2021-10-09 LAB — BASIC METABOLIC PANEL
BUN/Creatinine Ratio: 22 (ref 10–24)
BUN: 21 mg/dL (ref 8–27)
CO2: 21 mmol/L (ref 20–29)
Calcium: 9.3 mg/dL (ref 8.6–10.2)
Chloride: 103 mmol/L (ref 96–106)
Creatinine, Ser: 0.96 mg/dL (ref 0.76–1.27)
Glucose: 140 mg/dL — ABNORMAL HIGH (ref 70–99)
Potassium: 4.7 mmol/L (ref 3.5–5.2)
Sodium: 139 mmol/L (ref 134–144)
eGFR: 89 mL/min/{1.73_m2} (ref >59)

## 2021-10-09 SURGERY — LEFT HEART CATH AND CORONARY ANGIOGRAPHY
Anesthesia: LOCAL

## 2021-10-09 SURGERY — LARYNGOSCOPY, WITH ESOPHAGOSCOPY
Anesthesia: General

## 2021-10-09 MED ORDER — ACETAMINOPHEN 325 MG PO TABS: 650.0000 mg | ORAL_TABLET | ORAL | Status: DC | PRN

## 2021-10-09 MED ORDER — FENTANYL CITRATE (PF) 100 MCG/2ML IJ SOLN
INTRAMUSCULAR | Status: DC | PRN
  Administered 2021-10-09: 25 ug via INTRAVENOUS

## 2021-10-09 MED ORDER — LIDOCAINE HCL (PF) 1 % IJ SOLN
INTRAMUSCULAR | Status: AC
Start: 2021-10-09 — End: ?
  Filled 2021-10-09: qty 30

## 2021-10-09 MED ORDER — SODIUM CHLORIDE 0.9% FLUSH: 3.0000 mL | Freq: Two times a day (BID) | INTRAVENOUS | Status: DC

## 2021-10-09 MED ORDER — HEPARIN SODIUM (PORCINE) 1000 UNIT/ML IJ SOLN
INTRAMUSCULAR | Status: AC
  Filled 2021-10-09: qty 10

## 2021-10-09 MED ORDER — SODIUM CHLORIDE 0.9% FLUSH: 3.0000 mL | INTRAVENOUS | Status: DC | PRN

## 2021-10-09 MED ORDER — ASPIRIN 81 MG PO CHEW: 81.0000 mg | CHEWABLE_TABLET | ORAL | Status: DC

## 2021-10-09 MED ORDER — LIDOCAINE HCL (PF) 1 % IJ SOLN
INTRAMUSCULAR | Status: DC | PRN
  Administered 2021-10-09: 2 mL

## 2021-10-09 MED ORDER — MIDAZOLAM HCL 2 MG/2ML IJ SOLN
INTRAMUSCULAR | Status: DC | PRN
  Administered 2021-10-09: 2 mg via INTRAVENOUS

## 2021-10-09 MED ORDER — HEPARIN (PORCINE) IN NACL 1000-0.9 UT/500ML-% IV SOLN
INTRAVENOUS | Status: AC
  Filled 2021-10-09: qty 1000

## 2021-10-09 MED ORDER — VERAPAMIL HCL 2.5 MG/ML IV SOLN
INTRAVENOUS | Status: AC
  Filled 2021-10-09: qty 2

## 2021-10-09 MED ORDER — FENTANYL CITRATE (PF) 100 MCG/2ML IJ SOLN
INTRAMUSCULAR | Status: AC
  Filled 2021-10-09: qty 2

## 2021-10-09 MED ORDER — MIDAZOLAM HCL 2 MG/2ML IJ SOLN
INTRAMUSCULAR | Status: AC
  Filled 2021-10-09: qty 2

## 2021-10-09 MED ORDER — SODIUM CHLORIDE 0.9 % IV SOLN: INTRAVENOUS | Status: AC

## 2021-10-09 MED ORDER — SODIUM CHLORIDE 0.9 % WEIGHT BASED INFUSION
3.0000 mL/kg/h | INTRAVENOUS | Status: AC
  Administered 2021-10-09: 3 mL/kg/h via INTRAVENOUS

## 2021-10-09 MED ORDER — HEPARIN (PORCINE) IN NACL 1000-0.9 UT/500ML-% IV SOLN
INTRAVENOUS | Status: DC | PRN
  Administered 2021-10-09 (×2): 500 mL

## 2021-10-09 MED ORDER — LABETALOL HCL 5 MG/ML IV SOLN: 10.0000 mg | INTRAVENOUS | Status: DC | PRN

## 2021-10-09 MED ORDER — SODIUM CHLORIDE 0.9 % IV SOLN: 250.0000 mL | INTRAVENOUS | Status: DC | PRN

## 2021-10-09 MED ORDER — HYDRALAZINE HCL 20 MG/ML IJ SOLN: 10.0000 mg | INTRAMUSCULAR | Status: DC | PRN

## 2021-10-09 MED ORDER — ONDANSETRON HCL 4 MG/2ML IJ SOLN: 4.0000 mg | Freq: Four times a day (QID) | INTRAMUSCULAR | Status: DC | PRN

## 2021-10-09 MED ORDER — SODIUM CHLORIDE 0.9 % WEIGHT BASED INFUSION: 1.0000 mL/kg/h | INTRAVENOUS | Status: DC

## 2021-10-09 MED ORDER — IOHEXOL 350 MG/ML SOLN
INTRAVENOUS | Status: DC | PRN
  Administered 2021-10-09: 55 mL

## 2021-10-09 MED ORDER — HEPARIN SODIUM (PORCINE) 1000 UNIT/ML IJ SOLN
INTRAMUSCULAR | Status: DC | PRN
  Administered 2021-10-09: 5000 [IU] via INTRAVENOUS

## 2021-10-09 MED ORDER — VERAPAMIL HCL 2.5 MG/ML IV SOLN
INTRAVENOUS | Status: DC | PRN
  Administered 2021-10-09: 10 mL via INTRA_ARTERIAL

## 2021-10-09 SURGICAL SUPPLY — 11 items
BAND CMPR LRG ZPHR (HEMOSTASIS) ×1
BAND ZEPHYR COMPRESS 30 LONG (HEMOSTASIS) ×1 IMPLANT
CATH 5FR JL3.5 JR4 ANG PIG MP (CATHETERS) ×1 IMPLANT
CATH INFINITI JR4 5F (CATHETERS) ×1 IMPLANT
GLIDESHEATH SLEND SS 6F .021 (SHEATH) ×1 IMPLANT
GUIDEWIRE INQWIRE 1.5J.035X260 (WIRE) IMPLANT
INQWIRE 1.5J .035X260CM (WIRE) ×2
KIT HEART LEFT (KITS) ×2 IMPLANT
PACK CARDIAC CATHETERIZATION (CUSTOM PROCEDURE TRAY) ×2 IMPLANT
TRANSDUCER W/STOPCOCK (MISCELLANEOUS) ×2 IMPLANT
TUBING CIL FLEX 10 FLL-RA (TUBING) ×2 IMPLANT

## 2021-10-09 NOTE — Discharge Instructions (Signed)
Resume Eliquis tomorrow if no bleeding from right arm cath stie

## 2021-10-09 NOTE — Interval H&P Note (Signed)
History and Physical Interval Note:  10/09/2021 7:11 AM  Trevor Steele  has presented today for surgery, with the diagnosis of Abnormal Stress Test.  The various methods of treatment have been discussed with the patient and family. After consideration of risks, benefits and other options for treatment, the patient has consented to  Procedure(s): LEFT HEART CATH AND CORONARY ANGIOGRAPHY (N/A) as a surgical intervention.  The patient's history has been reviewed, patient examined, no change in status, stable for surgery.  I have reviewed the patient's chart and labs.  Questions were answered to the patient's satisfaction.    Cath Lab Visit (complete for each Cath Lab visit)  Clinical Evaluation Leading to the Procedure:   ACS: No.  Non-ACS:    Anginal Classification: No Symptoms  Anti-ischemic medical therapy: Minimal Therapy (1 class of medications)  Non-Invasive Test Results: High-risk stress test findings: cardiac mortality >3%/year  Prior CABG: No previous CABG        Trevor Steele

## 2021-10-17 ENCOUNTER — Telehealth: Payer: Self-pay | Admitting: *Deleted

## 2021-10-17 ENCOUNTER — Telehealth: Payer: Self-pay | Admitting: Internal Medicine

## 2021-10-17 NOTE — Telephone Encounter (Signed)
I s/w the pt and he is agreeable to plan of care for tele pre op appt 10/22/21 @ 9 am. Med rec and consent are done..    Patient Consent for Virtual Visit        Trevor Steele has provided verbal consent on 10/17/2021 for a virtual visit (video or telephone).   CONSENT FOR VIRTUAL VISIT FOR:  Trevor Steele  By participating in this virtual visit I agree to the following:  I hereby voluntarily request, consent and authorize [facility] and its employed or contracted physicians, physician assistants, nurse practitioners or other licensed health care professionals (the Practitioner), to provide me with telemedicine health care services (the "Services") as deemed necessary by the treating Practitioner. I acknowledge and consent to receive the Services by the Practitioner via telemedicine. I understand that the telemedicine visit will involve communicating with the Practitioner through live audiovisual communication technology and the disclosure of certain medical information by electronic transmission. I acknowledge that I have been given the opportunity to request an in-person assessment or other available alternative prior to the telemedicine visit and am voluntarily participating in the telemedicine visit.  I understand that I have the right to withhold or withdraw my consent to the use of telemedicine in the course of my care at any time, without affecting my right to future care or treatment, and that the Practitioner or I may terminate the telemedicine visit at any time. I understand that I have the right to inspect all information obtained and/or recorded in the course of the telemedicine visit and may receive copies of available information for a reasonable fee.  I understand that some of the potential risks of receiving the Services via telemedicine include:  Delay or interruption in medical evaluation due to technological equipment failure or disruption; Information transmitted may not  be sufficient (e.g. poor resolution of images) to allow for appropriate medical decision making by the Practitioner; and/or  In rare instances, security protocols could fail, causing a breach of personal health information.  Furthermore, I acknowledge that it is my responsibility to provide information about my medical history, conditions and care that is complete and accurate to the best of my ability. I acknowledge that Practitioner's advice, recommendations, and/or decision may be based on factors not within their control, such as incomplete or inaccurate data provided by me or distortions of diagnostic images or specimens that may result from electronic transmissions. I understand that the practice of medicine is not an exact science and that Practitioner makes no warranties or guarantees regarding treatment outcomes. I acknowledge that a copy of this consent can be made available to me via my patient portal ([facility]), or I can request a printed copy by calling the office of [facility].    I understand that my insurance will be billed for this visit.   I have read or had this consent read to me. I understand the contents of this consent, which adequately explains the benefits and risks of the Services being provided via telemedicine.  I have been provided ample opportunity to ask questions regarding this consent and the Services and have had my questions answered to my satisfaction. I give my informed consent for the services to be provided through the use of telemedicine in my medical care

## 2021-10-17 NOTE — Telephone Encounter (Signed)
   Pre-operative Risk Assessment    Patient Name: Trevor Steele  DOB: Dec 25, 1960 MRN: [id number]      Request for Surgical Clearance    Procedure:   Direct Laryngoscopy Esophagoscopy Possible Tonsillectomy  with Frozen Section  Date of Surgery:  Clearance 10/30/21                                 Surgeon:  Dr. [name] Surgeon's Group or Practice Name:  [facility] Phone number:  [phone] Fax number:  [phone]   Type of Clearance Requested:   - Pharmacy:  Hold Apixaban (Eliquis) -How many days to stop and when to resume   Type of Anesthesia:  General    Additional requests/questions:  Please advise surgeon/provider what medications should be held.  Signed, Belisicia T Harris   10/17/2021, 11:17 AM

## 2021-10-17 NOTE — Telephone Encounter (Signed)
Patient with diagnosis of afib on Eliquis for anticoagulation.    Procedure: Direct Laryngoscopy Esophagoscopy Possible Tonsillectomy  with Frozen Section Date of procedure: 10/30/21   CHA2DS2-VASc Score = 4   This indicates a 4.8% annual risk of stroke. The patient's score is based upon: CHF History: 1 HTN History: 1 Diabetes History: 1 Stroke History: 0 Vascular Disease History: 1 Age Score: 0 Gender Score: 0      CrCl 100 ml/min  Per office protocol, patient can hold Eliquis for 2 days prior to procedure.

## 2021-10-17 NOTE — Telephone Encounter (Signed)
I s/w the pt and he is agreeable to plan of care for tele pre op appt 10/22/21 @ 9 am. Med rec and consent are done.Trevor Steele

## 2021-10-17 NOTE — Telephone Encounter (Signed)
Primary Cardiologist:Arun Luiz Iron, MD  Chart reviewed as part of pre-operative protocol coverage. Because of Zollie L Steinert's past medical history and time since last visit, he/she will require a virtual visit/telephone call in order to better assess preoperative cardiovascular risk.  Pre-op covering staff: - Please contact patient, obtain consent, and schedule appointment   Request regarding holding anticoagulant has been addressed by Pharm D.   Emmaline Life, NP-C    10/17/2021, 3:24 PM [facility] [address] Office [phone] Fax [phone]

## 2021-10-21 NOTE — H&P (Signed)
HPI:   Trevor Steele is a 61 y.o. male who presents as a consult Patient.   Referring Provider: Marina Goodell, MD  Chief complaint: Neck mass.  HPI: He discovered a neck mass on the left side couple months ago. It has been fluctuating in size but it has remained there and has been noted to be very hard. He has been in and out of the hospital a few times recently with atrial fibrillation. He had a CT scan while in the hospital recently. He is a smoker. He has no throat symptoms.  PMH/Meds/All/SocHx/FamHx/ROS:   History reviewed. No pertinent past medical history.  History reviewed. No pertinent surgical history.  No family history of bleeding disorders, wound healing problems or difficulty with anesthesia.   Social History   Socioeconomic History   Marital status: Married  Spouse name: Not on file   Number of children: Not on file   Years of education: Not on file   Highest education level: Not on file  Occupational History   Not on file  Tobacco Use   Smoking status: Every Day  Types: Cigarettes   Smokeless tobacco: Current  Substance and Sexual Activity   Alcohol use: Not Currently   Drug use: Not on file   Sexual activity: Not on file  Other Topics Concern   Not on file  Social History Narrative   Not on file   Social Determinants of Health   Financial Resource Strain: Not on file  Food Insecurity: Not on file  Transportation Needs: Not on file  Physical Activity: Not on file  Stress: Not on file  Social Connections: Not on file  Housing Stability: Not on file   Current Outpatient Medications:   apixaban (ELIQUIS) 5 mg tablet *ANTICOAGULANT*, Take 1 tablet (5 mg total) by mouth., Disp: , Rfl:   atorvastatin (LIPITOR) 40 MG tablet, Take 1 tablet (40 mg total) by mouth daily., Disp: , Rfl:   dilTIAZem (CARDIZEM) 30 MG tablet, Take 1 tablet (30 mg total) by mouth., Disp: , Rfl:   empagliflozin (JARDIANCE) 10 mg Tab tablet, Take 1 tablet (10 mg total) by  mouth., Disp: , Rfl:   esomeprazole (NEXIUM) 40 MG capsule, Take 1 capsule (40 mg total) by mouth daily., Disp: , Rfl:   glimepiride (AMARYL) 1 MG tablet, Take 1 tablet (1 mg total) by mouth., Disp: , Rfl:   lisinopriL (PRINIVIL,ZESTRIL) 10 MG tablet, Take 1 tablet (10 mg total) by mouth daily., Disp: , Rfl:   metFORMIN (GLUCOPHAGE) 1000 MG tablet, Take 1 tablet (1,000 mg total) by mouth 2 times daily with meals., Disp: , Rfl:   metoPROLOL succinate (TOPROL-XL) 50 MG 24 hr tablet, Take 1 tablet (50 mg total) by mouth., Disp: , Rfl:   A complete ROS was performed with pertinent positives/negatives noted in the HPI. The remainder of the ROS are negative.   Physical Exam:   Temp 98 F (36.7 C)  Wt 90.3 kg (199 lb)   General: Healthy and alert, in no distress, breathing easily. Normal affect. In a pleasant mood. Head: Normocephalic, atraumatic. No masses, or scars. Eyes: Pupils are equal, and reactive to light. Vision is grossly intact. No spontaneous or gaze nystagmus. Ears: Ear canals are clear. Tympanic membranes are intact, with normal landmarks and the middle ears are clear and healthy. Hearing: Grossly normal. Nose: Nasal cavities are clear with healthy mucosa, no polyps or exudate. Airways are patent. Face: No masses or scars, facial nerve function is symmetric. Oral Cavity:  No mucosal abnormalities are noted. Tongue with normal mobility. Dentition appears healthy. Oropharynx: Tonsils are symmetric. There are no mucosal masses identified. Tongue base appears normal and healthy. Larynx/Hypopharynx: indirect exam reveals healthy, mobile vocal cords, without mucosal lesions in the hypopharynx or larynx. Chest: Deferred Neck: 3 cm hard level 2 node left side, otherwise no cervical adenopathy, no thyroid nodules or enlargement. Neuro: Cranial nerves II-XII with normal function. Balance: Normal gate. Other findings: none.  Independent Review of Additional Tests or Records:  CT  neck:  IMPRESSION:  Enlarged, centrally necrotic left level 2A lymph nodes, which are  concerning for nodal metastasis, without a definite primary in the  neck. Recommend ENT consultation and sampling.   CT chest:  IMPRESSION:  1. Stable emphysema and small pulmonary nodules in the chest.  2. Atherosclerosis of the thoracic and abdominal aorta and coronary  atherosclerosis. No evidence of aortic aneurysmal disease.  3. Stable 2.8 cm left adrenal mass. This is stable since 2017 and  therefore consistent with benign adenoma.  4. No evidence of primary malignancy in the chest, abdomen or  pelvis.   Procedures:  Procedure Note:  Indications for procedure: neck mass  Details of the procedure were discussed with the patient and all questions were answered.  Procedure:  2% xylocaine with epinephrine was infiltrated into the overlying skin. First pass was made with a 25 gauge needle and 10 cc syringe. Second pass was made with a 22 gauge needle. Specimen was placed on microscopic slides and air-dried. Additional material was placed in Cytolyte solution for cell block preparation. A third pass was made with a 22 guage needle and sample was added to the cytolyte solution.  A bandage was applied.   He tolerated the procedure well. Results will be discussed when available.  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 Fine-needle aspiration biopsy performed today. We will await results and then will anticipate scheduling for PET scan and evaluation at the cancer center. Continue work-up for his cardiac issues.

## 2021-10-22 ENCOUNTER — Ambulatory Visit (INDEPENDENT_AMBULATORY_CARE_PROVIDER_SITE_OTHER): Payer: BC Managed Care – PPO | Admitting: Physician Assistant

## 2021-10-22 ENCOUNTER — Ambulatory Visit: Payer: BC Managed Care – PPO | Admitting: Orthopaedic Surgery

## 2021-10-22 DIAGNOSIS — Z7901 Long term (current) use of anticoagulants: Secondary | ICD-10-CM | POA: Diagnosis not present

## 2021-10-22 DIAGNOSIS — Z01810 Encounter for preprocedural cardiovascular examination: Secondary | ICD-10-CM | POA: Diagnosis not present

## 2021-10-22 NOTE — Progress Notes (Signed)
Virtual Visit via Telephone Note   Because of Trevor Steele's co-morbid illnesses, he is at least at moderate risk for complications without adequate follow up.  This format is felt to be most appropriate for this patient at this time.  The patient did not have access to video technology/had technical difficulties with video requiring transitioning to audio format only (telephone).  All issues noted in this document were discussed and addressed.  No physical exam could be performed with this format.  Please refer to the patient's chart for his consent to telehealth for [facility].  Evaluation Performed:  Preoperative cardiovascular risk assessment _____________   Date:  10/22/2021   Patient ID:  Trevor Steele, DOB 16-Sep-1960, MRN [id number] Patient Location:  Home Provider location:   Office  Primary Care Provider:  [name], MD Primary Cardiologist:  Early Osmond, MD  Chief Complaint / Patient Profile   61 y.o. 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  Past Medical History    Past Medical History:  Diagnosis Date   Agatston coronary artery calcium score greater than 400    coronary calcium score high at 1183 which is 86 percentile for age, race and sex matched controls.   Aortic atherosclerosis (HCC)    Aortic stenosis    COVID 06/28/2021   no symptoms, went to hospital with a-fib and diagnosed then   Diabetes mellitus without complication ([place])    Dysrhythmia    A-fib   GERD (gastroesophageal reflux disease)    High cholesterol    History of kidney stones    Hypertension    Mild dilation of ascending aorta ([place])    by echo 07/2021 though not corroborated on CT    Mitral regurgitation    Neck mass    Abnormal neck CT 07/2021 concerning for nodal metastasis   PAF (paroxysmal atrial fibrillation) (HCC)    Pneumonia    Pulmonary nodules    PVC's (premature ventricular contractions)    Past Surgical History:  Procedure Laterality Date   BACK SURGERY     BACK SURGERY     lumbar   LEFT HEART CATH AND CORONARY ANGIOGRAPHY N/A 10/09/2021   Procedure: LEFT HEART CATH AND CORONARY ANGIOGRAPHY;  Surgeon: Burnell Blanks, MD;  Location: [place] [facility];  Service: Cardiovascular;  Laterality: N/A;    Allergies  No Known Allergies  History of Present Illness    Trevor Steele is a 61 y.o. male who presents via audio/video conferencing for a telehealth visit today.  Pt was last seen in cardiology clinic on 09/25/21 by Ermalinda Barrios PAC.  At that time Trevor Steele reported chest pain concerning for angina. He subsequently underwent left heart catheterization following abnormal nuclear stress test that showed moderate nonobstructive CAD, no PCI  The patient is now pending procedure as outlined above. Since his last visit, he continues to walk 2.5-3 miles most days per week. He has no new symptoms.    Home Medications    Prior to Admission medications   Medication Sig Start Date End Date Taking? Authorizing Provider  acetaminophen (TYLENOL) 500 MG tablet Take 1,000 mg by mouth every 6 (six) hours as needed for mild pain.    [provider]  apixaban (  ELIQUIS) 5 MG TABS tablet Take 5 mg by mouth 2 (two) times daily.    [provider]  atorvastatin (LIPITOR) 40 MG tablet Take 1 tablet (40 mg total) by mouth daily. 07/16/21   Early Osmond, MD  diltiazem (CARDIZEM) 30 MG tablet Take 1 tablet (30 mg total) by mouth every 6 (six) hours as needed (palpitations). Patient not taking: Reported on 10/17/2021 08/13/21 08/13/22  Charlie Pitter, PA-C  empagliflozin (JARDIANCE) 10 MG TABS tablet Take 1 tablet (10 mg total) by mouth daily  before breakfast. 07/16/21   Early Osmond, MD  esomeprazole (NEXIUM) 40 MG capsule Take 40 mg by mouth daily. 10/10/15   [provider]  flecainide (TAMBOCOR) 50 MG tablet Take 1 tablet (50 mg total) by mouth 2 (two) times daily. 08/23/21   Evans Lance, MD  glimepiride (AMARYL) 1 MG tablet Take 1 mg by mouth every evening.    [provider]  lisinopril (ZESTRIL) 10 MG tablet Take 1 tablet (10 mg total) by mouth daily. 08/04/21   Johnson, Clanford L, MD  metFORMIN (GLUCOPHAGE) 1000 MG tablet Take 1,000 mg by mouth 2 (two) times daily. 10/10/15   [provider]  metoprolol succinate (TOPROL-XL) 50 MG 24 hr tablet Take 1 tablet (50 mg total) by mouth in the morning and at bedtime. Take with or immediately following a meal. 08/13/21   Little Ishikawa, MD  Multiple Vitamin (MULTIVITAMIN WITH MINERALS) TABS tablet Take 1 tablet by mouth daily.    [provider]    Physical Exam    Vital Signs:  Trevor Steele does not have vital signs available for review today.  Given telephonic nature of communication, physical exam is limited. AAOx3. NAD. Normal affect.  Speech and respirations are unlabored.  Accessory Clinical Findings    None  Assessment & Plan    1.  Preoperative Cardiovascular Risk Assessment:  Recent heart catheterization revealed nonobstructive CAD.  No PCI was performed.  Risk factor modification was recommended.  He walks 2.5 to 3 miles many days per week.  He denies any new or worsening symptoms.  He feels his A-fib is better controlled on flecainide.  Per our clinical pharmacist: He may hold Eliquis for 2 days prior to procedure.  Therefore, based on ACC/AHA guidelines, the patient would be at acceptable risk for the planned procedure without further cardiovascular testing.   The patient was advised that if he develops new symptoms prior to surgery to contact our office to arrange for a follow-up visit, and he verbalized  understanding.  A copy of this note will be routed to requesting surgeon.  Time:   Today, I have spent 8 minutes with the patient with telehealth technology discussing medical history, symptoms, and management plan.     Tami Lin Kari Kerth, PA  10/22/2021, 9:18 AM

## 2021-10-29 ENCOUNTER — Other Ambulatory Visit: Payer: Self-pay

## 2021-10-29 ENCOUNTER — Encounter (HOSPITAL_COMMUNITY): Payer: Self-pay | Admitting: Otolaryngology

## 2021-10-29 NOTE — Anesthesia Preprocedure Evaluation (Addendum)
Anesthesia Evaluation  Patient identified by MRN, date of birth, ID band Patient awake  General Assessment Comment:Neck mass  Reviewed: Allergy & Precautions, NPO status , Patient's Chart, lab work & pertinent test results, reviewed documented beta blocker date and time   Airway Mallampati: II  TM Distance: <3 FB Neck ROM: Full    Dental  (+) Dental Advisory Given, Poor Dentition, Chipped, Missing,    Pulmonary former smoker,    Pulmonary exam normal breath sounds clear to auscultation       Cardiovascular hypertension, Pt. on medications and Pt. on home beta blockers + CAD and + Peripheral Vascular Disease  + dysrhythmias Atrial Fibrillation + Valvular Problems/Murmurs AS and MR  Rhythm:Regular Rate:Normal + Systolic murmurs LHC recommended which was done on 10/09/21 and showed moderate mid LAD and mild CX and RCA disease with medical therapy recommended. EF 23% by June stress test and 45-50% with mild-moderate AS, mild AI/MR by March 2023 echo   Neuro/Psych negative neurological ROS  negative psych ROS   GI/Hepatic Neg liver ROS, GERD  Medicated,  Endo/Other  diabetes, Type 2, Oral Hypoglycemic Agents  Renal/GU negative Renal ROS     Musculoskeletal negative musculoskeletal ROS (+)   Abdominal   Peds  Hematology  (+) Blood dyscrasia (Eliquis), ,   Anesthesia Other Findings Day of surgery medications reviewed with the patient.  Reproductive/Obstetrics                          Anesthesia Physical Anesthesia Plan  ASA: 4  Anesthesia Plan: General   Post-op Pain Management: Tylenol PO (pre-op)*   Induction: Intravenous  PONV Risk Score and Plan: 3 and Midazolam, Dexamethasone and Ondansetron  Airway Management Planned: Oral ETT and Video Laryngoscope Planned  Additional Equipment: ClearSight  Intra-op Plan:   Post-operative Plan: Extubation in OR  Informed Consent: I have reviewed the  patients History and Physical, chart, labs and discussed the procedure including the risks, benefits and alternatives for the proposed anesthesia with the patient or authorized representative who has indicated his/her understanding and acceptance.     Dental advisory given  Plan Discussed with: CRNA  Anesthesia Plan Comments: (PAT note written 10/29/2021 by Myra Gianotti, PA-C.  Etomidate for induction)     Anesthesia Quick Evaluation

## 2021-10-30 ENCOUNTER — Encounter (HOSPITAL_COMMUNITY): Payer: Self-pay | Admitting: Otolaryngology

## 2021-10-30 ENCOUNTER — Ambulatory Visit (HOSPITAL_COMMUNITY)
Admission: RE | Admit: 2021-10-30 | Discharge: 2021-10-30 | Disposition: A | Discharge status: Home / Self Care | Payer: BC Managed Care – PPO | Attending: Otolaryngology | Admitting: Otolaryngology

## 2021-10-30 ENCOUNTER — Encounter (HOSPITAL_COMMUNITY)
Admission: RE | Disposition: A | Discharge status: Home / Self Care | Payer: Self-pay | Source: Home / Self Care | Attending: Otolaryngology

## 2021-10-30 ENCOUNTER — Ambulatory Visit (HOSPITAL_COMMUNITY): Payer: BC Managed Care – PPO | Admitting: Physician Assistant

## 2021-10-30 ENCOUNTER — Other Ambulatory Visit: Payer: Self-pay

## 2021-10-30 DIAGNOSIS — E119 Type 2 diabetes mellitus without complications: Secondary | ICD-10-CM | POA: Insufficient documentation

## 2021-10-30 DIAGNOSIS — C01 Malignant neoplasm of base of tongue: Secondary | ICD-10-CM | POA: Insufficient documentation

## 2021-10-30 DIAGNOSIS — Z20822 Contact with and (suspected) exposure to covid-19: Secondary | ICD-10-CM | POA: Insufficient documentation

## 2021-10-30 DIAGNOSIS — C770 Secondary and unspecified malignant neoplasm of lymph nodes of head, face and neck: Secondary | ICD-10-CM | POA: Diagnosis not present

## 2021-10-30 DIAGNOSIS — C801 Malignant (primary) neoplasm, unspecified: Secondary | ICD-10-CM | POA: Diagnosis not present

## 2021-10-30 DIAGNOSIS — Z7984 Long term (current) use of oral hypoglycemic drugs: Secondary | ICD-10-CM | POA: Diagnosis not present

## 2021-10-30 DIAGNOSIS — K219 Gastro-esophageal reflux disease without esophagitis: Secondary | ICD-10-CM | POA: Diagnosis not present

## 2021-10-30 DIAGNOSIS — E1151 Type 2 diabetes mellitus with diabetic peripheral angiopathy without gangrene: Secondary | ICD-10-CM | POA: Diagnosis not present

## 2021-10-30 DIAGNOSIS — C7989 Secondary malignant neoplasm of other specified sites: Secondary | ICD-10-CM | POA: Insufficient documentation

## 2021-10-30 DIAGNOSIS — I739 Peripheral vascular disease, unspecified: Secondary | ICD-10-CM | POA: Insufficient documentation

## 2021-10-30 DIAGNOSIS — I10 Essential (primary) hypertension: Secondary | ICD-10-CM | POA: Insufficient documentation

## 2021-10-30 DIAGNOSIS — I480 Paroxysmal atrial fibrillation: Secondary | ICD-10-CM | POA: Diagnosis not present

## 2021-10-30 DIAGNOSIS — R221 Localized swelling, mass and lump, neck: Secondary | ICD-10-CM | POA: Diagnosis not present

## 2021-10-30 DIAGNOSIS — F17210 Nicotine dependence, cigarettes, uncomplicated: Secondary | ICD-10-CM | POA: Diagnosis not present

## 2021-10-30 DIAGNOSIS — I2510 Atherosclerotic heart disease of native coronary artery without angina pectoris: Secondary | ICD-10-CM | POA: Diagnosis not present

## 2021-10-30 HISTORY — PX: LARYNGOSCOPY AND ESOPHAGOSCOPY: SHX5660

## 2021-10-30 HISTORY — PX: TONSILLECTOMY: SHX5217

## 2021-10-30 LAB — GLUCOSE, CAPILLARY: Glucose-Capillary: 157 mg/dL — ABNORMAL HIGH (ref 70–99)

## 2021-10-30 LAB — SARS CORONAVIRUS 2 BY RT PCR: SARS Coronavirus 2 by RT PCR: NEGATIVE

## 2021-10-30 SURGERY — LARYNGOSCOPY, WITH ESOPHAGOSCOPY
Anesthesia: General

## 2021-10-30 MED ORDER — CHLORHEXIDINE GLUCONATE 0.12 % MT SOLN
15.0000 mL | Freq: Once | OROMUCOSAL | Status: AC
  Administered 2021-10-30: 15 mL via OROMUCOSAL
  Filled 2021-10-30: qty 15

## 2021-10-30 MED ORDER — EPHEDRINE SULFATE-NACL 50-0.9 MG/10ML-% IV SOSY
PREFILLED_SYRINGE | INTRAVENOUS | Status: DC | PRN
  Administered 2021-10-30: 10 mg via INTRAVENOUS

## 2021-10-30 MED ORDER — 0.9 % SODIUM CHLORIDE (POUR BTL) OPTIME
TOPICAL | Status: DC | PRN
  Administered 2021-10-30: 1000 mL

## 2021-10-30 MED ORDER — ESMOLOL HCL 100 MG/10ML IV SOLN
INTRAVENOUS | Status: DC | PRN
  Administered 2021-10-30 (×2): 20 mg via INTRAVENOUS

## 2021-10-30 MED ORDER — PHENYLEPHRINE HCL-NACL 20-0.9 MG/250ML-% IV SOLN
INTRAVENOUS | Status: DC | PRN
  Administered 2021-10-30: 10 ug/min via INTRAVENOUS

## 2021-10-30 MED ORDER — PROPOFOL 10 MG/ML IV BOLUS
INTRAVENOUS | Status: AC
  Filled 2021-10-30: qty 20

## 2021-10-30 MED ORDER — ETOMIDATE 2 MG/ML IV SOLN
INTRAVENOUS | Status: DC | PRN
  Administered 2021-10-30: 16 mg via INTRAVENOUS

## 2021-10-30 MED ORDER — FENTANYL CITRATE (PF) 250 MCG/5ML IJ SOLN
INTRAMUSCULAR | Status: DC | PRN
  Administered 2021-10-30: 100 ug via INTRAVENOUS
  Administered 2021-10-30: 50 ug via INTRAVENOUS

## 2021-10-30 MED ORDER — FENTANYL CITRATE (PF) 250 MCG/5ML IJ SOLN
INTRAMUSCULAR | Status: AC
  Filled 2021-10-30: qty 5

## 2021-10-30 MED ORDER — MIDAZOLAM HCL 2 MG/2ML IJ SOLN
INTRAMUSCULAR | Status: AC
  Filled 2021-10-30: qty 2

## 2021-10-30 MED ORDER — DEXAMETHASONE SODIUM PHOSPHATE 10 MG/ML IJ SOLN
INTRAMUSCULAR | Status: DC | PRN
  Administered 2021-10-30: 10 mg via INTRAVENOUS

## 2021-10-30 MED ORDER — MIDAZOLAM HCL 2 MG/2ML IJ SOLN
INTRAMUSCULAR | Status: DC | PRN
  Administered 2021-10-30: 2 mg via INTRAVENOUS

## 2021-10-30 MED ORDER — ONDANSETRON HCL 4 MG/2ML IJ SOLN: 4.0000 mg | Freq: Once | INTRAMUSCULAR | Status: DC | PRN

## 2021-10-30 MED ORDER — FENTANYL CITRATE (PF) 100 MCG/2ML IJ SOLN: 25.0000 ug | INTRAMUSCULAR | Status: DC | PRN

## 2021-10-30 MED ORDER — PHENYLEPHRINE 80 MCG/ML (10ML) SYRINGE FOR IV PUSH (FOR BLOOD PRESSURE SUPPORT)
PREFILLED_SYRINGE | INTRAVENOUS | Status: DC | PRN
  Administered 2021-10-30 (×2): 80 ug via INTRAVENOUS

## 2021-10-30 MED ORDER — SUGAMMADEX SODIUM 500 MG/5ML IV SOLN
INTRAVENOUS | Status: AC
  Filled 2021-10-30: qty 5

## 2021-10-30 MED ORDER — ONDANSETRON HCL 4 MG/2ML IJ SOLN
INTRAMUSCULAR | Status: DC | PRN
  Administered 2021-10-30: 4 mg via INTRAVENOUS

## 2021-10-30 MED ORDER — ACETAMINOPHEN 500 MG PO TABS
1000.0000 mg | ORAL_TABLET | Freq: Once | ORAL | Status: AC
  Administered 2021-10-30: 1000 mg via ORAL
  Filled 2021-10-30: qty 2

## 2021-10-30 MED ORDER — LACTATED RINGERS IV SOLN
INTRAVENOUS | Status: DC
Start: 2021-10-30 — End: 2021-10-30

## 2021-10-30 MED ORDER — ORAL CARE MOUTH RINSE: 15.0000 mL | Freq: Once | OROMUCOSAL | Status: AC

## 2021-10-30 MED ORDER — ROCURONIUM BROMIDE 10 MG/ML (PF) SYRINGE
PREFILLED_SYRINGE | INTRAVENOUS | Status: DC | PRN
  Administered 2021-10-30: 50 mg via INTRAVENOUS

## 2021-10-30 MED ORDER — LIDOCAINE 2% (20 MG/ML) 5 ML SYRINGE
INTRAMUSCULAR | Status: DC | PRN
  Administered 2021-10-30: 80 mg via INTRAVENOUS

## 2021-10-30 SURGICAL SUPPLY — 28 items
BAG COUNTER SPONGE SURGICOUNT (BAG) ×3 IMPLANT
CANISTER SUCT 3000ML PPV (MISCELLANEOUS) ×3 IMPLANT
CATH ROBINSON RED A/P 10FR (CATHETERS) ×3 IMPLANT
CLEANER TIP ELECTROSURG 2X2 (MISCELLANEOUS) ×3 IMPLANT
COAGULATOR SUCT SWTCH 10FR 6 (ELECTROSURGICAL) ×2 IMPLANT
ELECT COATED BLADE 2.86 ST (ELECTRODE) ×3 IMPLANT
ELECT REM PT RETURN 9FT ADLT (ELECTROSURGICAL) ×3
ELECTRODE REM PT RTRN 9FT ADLT (ELECTROSURGICAL) IMPLANT
GAUZE 4X4 16PLY ~~LOC~~+RFID DBL (SPONGE) ×3 IMPLANT
GLOVE ECLIPSE 7.5 STRL STRAW (GLOVE) ×3 IMPLANT
GOWN STRL REUS W/ TWL LRG LVL3 (GOWN DISPOSABLE) ×4 IMPLANT
GOWN STRL REUS W/TWL LRG LVL3 (GOWN DISPOSABLE) ×6
KIT BASIN OR (CUSTOM PROCEDURE TRAY) ×3 IMPLANT
KIT TURNOVER KIT B (KITS) ×3 IMPLANT
NS IRRIG 1000ML POUR BTL (IV SOLUTION) ×3 IMPLANT
PACK SURGICAL SETUP 50X90 (CUSTOM PROCEDURE TRAY) ×3 IMPLANT
PAD ARMBOARD 7.5X6 YLW CONV (MISCELLANEOUS) ×6 IMPLANT
PENCIL FOOT CONTROL (ELECTRODE) ×3 IMPLANT
SPECIMEN JAR SMALL (MISCELLANEOUS) ×5 IMPLANT
SPONGE TONSIL TAPE 1 RFD (DISPOSABLE) ×3 IMPLANT
SYR BULB EAR ULCER 3OZ GRN STR (SYRINGE) ×3 IMPLANT
TOWEL GREEN STERILE FF (TOWEL DISPOSABLE) ×6 IMPLANT
TUBE CONNECTING 12X1/4 (SUCTIONS) ×3 IMPLANT
TUBE SALEM SUMP 10F W/ARV (TUBING) IMPLANT
TUBE SALEM SUMP 12R W/ARV (TUBING) IMPLANT
TUBE SALEM SUMP 14F W/ARV (TUBING) IMPLANT
TUBE SALEM SUMP 16 FR W/ARV (TUBING) IMPLANT
WATER STERILE IRR 1000ML POUR (IV SOLUTION) ×3 IMPLANT

## 2021-10-30 NOTE — Anesthesia Postprocedure Evaluation (Signed)
Anesthesia Post Note  Patient: Trevor Steele  Procedure(s) Performed: DIRECT LARYNGOSCOPY WITH BIOPSY; ESOPHAGOSCOPY TONSILLECTOMY; FROZEN SECTION (Bilateral)     Patient location during evaluation: PACU Anesthesia Type: General Level of consciousness: awake and alert Pain management: pain level controlled Vital Signs Assessment: post-procedure vital signs reviewed and stable Respiratory status: spontaneous breathing, nonlabored ventilation, respiratory function stable and patient connected to nasal cannula oxygen Cardiovascular status: blood pressure returned to baseline, stable and bradycardic Postop Assessment: no apparent nausea or vomiting Anesthetic complications: no   No notable events documented.  Last Vitals:  Vitals:   10/30/21 1146  BP: (!) 164/60  Pulse: (!) 48  Resp: 20  Temp: 36.9 C  SpO2: 99%    Last Pain:  Vitals:   10/30/21 1217  TempSrc:   PainSc: 0-No pain                 Santa Lighter

## 2021-10-30 NOTE — Transfer of Care (Signed)
Immediate Anesthesia Transfer of Care Note  Patient: Trevor Steele  Procedure(s) Performed: DIRECT LARYNGOSCOPY WITH BIOPSY; ESOPHAGOSCOPY TONSILLECTOMY; FROZEN SECTION (Bilateral)  Patient Location: PACU  Anesthesia Type:General  Level of Consciousness: drowsy  Airway & Oxygen Therapy: Patient Spontanous Breathing and Patient connected to face mask oxygen  Post-op Assessment: Report given to RN and Post -op Vital signs reviewed and stable  Post vital signs: Reviewed and stable  Last Vitals:  Vitals Value Taken Time  BP    Temp    Pulse 83 10/30/21 1502  Resp 14 10/30/21 1502  SpO2 98 % 10/30/21 1502  Vitals shown include unvalidated device data.  Last Pain:  Vitals:   10/30/21 1217  TempSrc:   PainSc: 0-No pain         Complications: No notable events documented.

## 2021-10-30 NOTE — Interval H&P Note (Signed)
History and Physical Interval Note:  10/30/2021 1:39 PM  Trevor Steele  has presented today for surgery, with the diagnosis of Neck mass.  The various methods of treatment have been discussed with the patient and family. After consideration of risks, benefits and other options for treatment, the patient has consented to  Procedure(s): DIRECT LARYNGOSCOPY WITH BIOPSY; ESOPHAGOSCOPY (N/A) TONSILLECTOMY; FROZEN SECTION (Bilateral) as a surgical intervention.  The patient's history has been reviewed, patient examined, no change in status, stable for surgery.  I have reviewed the patient's chart and labs.  Questions were answered to the patient's satisfaction.     [name]

## 2021-10-30 NOTE — Anesthesia Procedure Notes (Signed)
Procedure Name: Intubation Date/Time: 10/30/2021 2:10 PM  Performed by: Vonna Drafts, CRNAPre-anesthesia Checklist: Patient identified, Emergency Drugs available, Suction available and Patient being monitored Patient Re-evaluated:Patient Re-evaluated prior to induction Oxygen Delivery Method: Circle system utilized Preoxygenation: Pre-oxygenation with 100% oxygen Induction Type: IV induction Ventilation: Mask ventilation without difficulty and Oral airway inserted - appropriate to patient size Laryngoscope Size: Mac and 3 Grade View: Grade I Tube type: Oral Tube size: 7.5 mm Number of attempts: 1 Airway Equipment and Method: Stylet and Oral airway Placement Confirmation: ETT inserted through vocal cords under direct vision, positive ETCO2 and breath sounds checked- equal and bilateral Secured at: 23 cm Tube secured with: Tape Dental Injury: Teeth and Oropharynx as per pre-operative assessment

## 2021-10-30 NOTE — Op Note (Signed)
OPERATIVE REPORT  DATE OF SURGERY: 10/30/2021  PATIENT:  Trevor Steele,  61 y.o. male  PRE-OPERATIVE DIAGNOSIS: Metastatic cancer left neck, unknown primary  POST-OPERATIVE DIAGNOSIS: Metastatic cancer left neck, squamous cell carcinoma left tongue base  PROCEDURE:  Procedure(s): DIRECT LARYNGOSCOPY WITH BIOPSY; ESOPHAGOSCOPY  FROZEN SECTION  SURGEON:  Beckie Salts, MD  ASSISTANTS: none  ANESTHESIA:   General   EBL: 20 ml  DRAINS: none  LOCAL MEDICATIONS USED:  None  SPECIMEN: Left base of tongue frozen section analysis consistent with squamous cell carcinoma.  COUNTS:  Correct  PROCEDURE DETAILS: The patient was taken to the operating room and placed on the operating table in the supine position. Following induction of general endotracheal anesthesia, the table was turned 90 and the patient was draped in a standard fashion.  A maxillary tooth protector was used throughout the procedure.  A Jako laryngoscope was entered into the oral cavity used to evaluate the larynx hypopharynx and oropharynx.  There were no obvious lesions identified except for a firm swollen area in the left tongue base.  This was also palpated manually and confirmed.  Multiple biopsies were taken and sent for frozen section analysis.  No other lesions were identified.  Cervical esophagoscope was introduced anterior cavity through the esophageal introitus and advanced into the esophagus as far as it would proceed.  It was then slowly withdrawn while suctioning secretions and examining the wall of the esophagus circumferentially.  No mucosal lesions were identified.  Patient was awakened extubated and transferred to recovery in stable condition.    PATIENT DISPOSITION:  To PACU, stable

## 2021-10-31 ENCOUNTER — Encounter (HOSPITAL_COMMUNITY): Payer: Self-pay | Admitting: Otolaryngology

## 2021-11-01 ENCOUNTER — Telehealth: Payer: Self-pay | Admitting: Radiation Oncology

## 2021-11-01 LAB — SURGICAL PATHOLOGY

## 2021-11-01 NOTE — Telephone Encounter (Signed)
Spoke with patient, he has declined [place] at our facility. Patient prefers to receive [place] closer to home.

## 2021-11-04 NOTE — Progress Notes (Addendum)
Radiation Oncology         [phone] ________________________________  Initial Outpatient Consultation  Name: Trevor Steele MRN: [id number]  Date: 11/06/2021  DOB: 11-08-1960  OZ:[id number], Carloyn Manner, MD  [name], MD   REFERRING PHYSICIAN: [name], MD  DIAGNOSIS:    ICD-10-CM   1. 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 male who was incidentally found to have a left neck mass / swelling while hospitalized for management of a-fib on 08/02/21. Soft tissue neck CT performed while admitted on 08/02/21 revealed enlarged, centrally necrotic left level 2A lymph nodes concerning for nodal metastasis without a definite primary.  CT of the chest abdomen and pelvis on 08/03/21 while the patient was admitted showed stable emphysema, stable small pulmonary nodules, and a stable 2.8 cm left adrenal mass since 2017 (consistent with benign adenoma). No evidence of of primary malignancy in the chest, abdomen or pelvis was appreciated.   The patient was then referred to Dr. Constance Holster on 08/16/21 for further management. During which time, the patient reported the neck mass to fluctuate in size over the course of several months but remain ever present. FNA of the left neck mass performed by Dr. Constance Holster during that visit revealed scattered sheets of epithelial cells with  a background of acute inflammation and cellular degeneration.   PET scan on 09/12/21 demonstrated asymmetric hypermetabolic activity in the deep left base of tongue region concerning for primary squamous cell carcinoma, as well as a solitary hypermetabolic metastatic left level II lymph node. 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 No additional evidence of metastatic squamous cell carcinoma was appreciated.   The patient proceeded to undergo a direct laryngoscopy under anesthesia for biopsies of left tongue base on 10/30/21. Pathology revealed:  invasive moderately differentiated squamous cell carcinoma; p16 positive. Diagnostic comment on pathology noted a microscopic focus suspicious for lymphovascular space involvement.  I have personally reviewed his imaging.  Of note: the patient underwent left heart catheterization on 10/09/21 which revealed nonobstructive CAD. CT cardiac scoring performed on 08/26/21 revealed a coronary calcium score of 1183 (putting the patient in the 96th percentile for age-, race-, and sex-matched controls). He also had a stress stress performed prior to catheterization on 10/03/21 which showed severe LV systolic dysfunction, with an EF of 23%, and findings consistent with previous MI.   Swallowing issues, if any: none  Weight Changes: none  Pain status: none  Other symptoms: left neck swelling  Tobacco history, if any: cut back on smoking approximately 3 months ago, prior use included >1 pack of cigarettes per day for decades  ETOH abuse, if any: none  Prior cancers, if any: none   Nutrition Status Yes No Comments  Weight changes? '[]'$  '[x]'$    Swallowing  concerns? '[]'$  '[x]'$    PEG? '[]'$  '[x]'$     Referrals Yes No Comments  Social Work? '[x]'$  '[]'$    Dentistry? '[x]'$  '[]'$    Swallowing therapy? '[x]'$  '[]'$    Nutrition? '[x]'$  '[]'$    Med/Onc? '[]'$  '[x]'$     Safety Issues Yes  No Comments  Prior radiation? '[]'$  '[x]'$    Pacemaker/ICD? '[]'$  '[x]'$    Possible current pregnancy? '[]'$  '[x]'$  N/A  Is the patient on methotrexate? '[]'$  '[x]'$       Current Complaints / other details:  Nothing else of note    PREVIOUS RADIATION THERAPY: No  PAST MEDICAL HISTORY:  has a past medical history of [place] coronary artery calcium score greater than 400, Aortic atherosclerosis ([place]), Aortic stenosis, COVID (06/28/2021), Diabetes mellitus without complication ([place]), Dysrhythmia, GERD (gastroesophageal reflux disease), High cholesterol, History of kidney stones, Hypertension, Mild dilation of ascending aorta (HCC), Mitral regurgitation, Neck mass, PAF (paroxysmal atrial fibrillation) ([place]), Pneumonia, Pulmonary nodules, and PVC's (premature ventricular contractions).    PAST SURGICAL HISTORY: Past Surgical History:  Procedure Laterality Date   BACK SURGERY     BACK SURGERY     lumbar   LARYNGOSCOPY AND ESOPHAGOSCOPY N/A 10/30/2021   Procedure: DIRECT LARYNGOSCOPY WITH BIOPSY; ESOPHAGOSCOPY;  Surgeon: [name], MD;  Location: [facility];  Service: ENT;  Laterality: N/A;   LEFT HEART CATH AND CORONARY ANGIOGRAPHY N/A 10/09/2021   Procedure: LEFT HEART CATH AND CORONARY ANGIOGRAPHY;  Surgeon: Burnell Blanks, MD;  Location: [facility];  Service: Cardiovascular;  Laterality: N/A;   TONSILLECTOMY Bilateral 10/30/2021   Procedure: TONSILLECTOMY; FROZEN SECTION;  Surgeon: [name], MD;  [facility];  Service: ENT;  Laterality: Bilateral;    FAMILY HISTORY: family history includes Coronary artery disease in his father and mother; Mesothelioma in his father.  SOCIAL HISTORY:  reports that he has been smoking cigarettes. He has been smoking an average of .5 packs per day. He does not have any smokeless tobacco history on file. He reports that he does not currently use alcohol. He reports that he does not use drugs.  ALLERGIES: Patient has no known allergies.  MEDICATIONS:   Current Outpatient Medications  Medication Sig Dispense Refill   acetaminophen (TYLENOL) 500 MG tablet Take 500 mg by mouth every 6 (six) hours as needed for mild pain.     apixaban (ELIQUIS) 5 MG TABS tablet Take 5 mg by mouth 2 (two) times daily.     atorvastatin (LIPITOR) 40 MG tablet Take 1 tablet (40 mg total) by mouth daily. 90 tablet 3   diltiazem (CARDIZEM) 30 MG tablet Take 1 tablet (30 mg total) by mouth every 6 (six) hours as needed (palpitations). 30 tablet 1   empagliflozin (JARDIANCE) 10 MG TABS tablet Take 1 tablet (10 mg total) by mouth daily before breakfast. 90 tablet 3   esomeprazole (NEXIUM) 40 MG capsule Take 40 mg by mouth daily.  12   flecainide (TAMBOCOR) 50 MG tablet Take 1 tablet (50 mg total) by mouth 2 (two) times daily. 60 tablet 11   glimepiride (AMARYL) 1 MG tablet Take 1 mg by mouth every evening.     lisinopril (ZESTRIL) 10 MG tablet Take 1 tablet (10 mg total) by mouth daily. 30 tablet 1   metFORMIN (GLUCOPHAGE) 1000 MG tablet Take 1,000 mg by mouth 2 (two) times daily.  12   metoprolol succinate (TOPROL-XL) 50 MG 24 hr tablet Take 1 tablet (50 mg total) by mouth in the morning and at bedtime. Take with or immediately following  a meal. 60 tablet 0   Multiple Vitamin (MULTIVITAMIN WITH MINERALS) TABS tablet Take 1 tablet by mouth daily.     Current Facility-Administered Medications  Medication Dose Route Frequency Provider Last Rate Last Admin   sodium chloride flush (NS) 0.9 % injection 3 mL  3 mL Intravenous Q12H Imogene Burn, PA-C        REVIEW OF SYSTEMS:  Notable for that above.   PHYSICAL EXAM:  height is '5\' 11"'$  (1.803 m) and weight is 191 lb 4 oz (86.8 kg). His oral temperature is 98.2 F (36.8 C). His blood pressure is 136/59 (abnormal) and his pulse is 69. His respiration is 18 and oxygen saturation is 100%.   General: Alert and oriented, in no acute distress HEENT: Head is normocephalic. Extraocular movements are intact. Oropharynx is notable  for no upper throat lesions. +teeth upper and lower Neck: Neck is notable for level 2/3 fixed mass on left Heart: bradycardic in rate and grossly regular rhythm with a soft systolic murmur  Chest: Clear to auscultation bilaterally, with no rhonchi, wheezes, or rales. Abdomen: Soft, nontender, nondistended, with no rigidity or guarding. Lymphatics: see Neck Exam Skin: No concerning lesions. Musculoskeletal: symmetric strength and muscle tone throughout. Neurologic: Cranial nerves II through XII are grossly intact. No obvious focalities. Speech is fluent. Coordination is intact. Psychiatric: Judgment and insight are intact. Affect is appropriate.   ECOG = 1  0 - Asymptomatic (Fully active, able to carry on all predisease activities without restriction)  1 - Symptomatic but completely ambulatory (Restricted in physically strenuous activity but ambulatory and able to carry out work of a light or sedentary nature. For example, light housework, office work)  2 - Symptomatic, <50% in bed during the day (Ambulatory and capable of all self care but unable to carry out any work activities. Up and about more than 50% of waking hours)  3 - Symptomatic, >50% in bed, but not bedbound (Capable of only limited self-care, confined to bed or chair 50% or more of waking hours)  4 - Bedbound (Completely disabled. Cannot carry on any self-care. Totally confined to bed or chair)  5 - Death   Eustace Pen MM, Creech RH, Tormey DC, et al. [phone]). "Toxicity and response criteria of the [facility]". [place] Oncol. 5 (6): 649-55   LABORATORY DATA:  Lab Results  Component Value Date   WBC 10.0 10/08/2021   HGB 14.7 10/08/2021   HCT 43.7 10/08/2021   MCV 92 10/08/2021   PLT 234 10/08/2021   CMP     Component Value Date/Time   NA 139 10/08/2021 0813   K 4.7 10/08/2021 0813   CL 103 10/08/2021 0813   CO2 21 10/08/2021 0813   GLUCOSE 140 (H) 10/08/2021 0813   GLUCOSE 111 (H)  08/12/2021 1851   BUN 21 10/08/2021 0813   CREATININE 0.96 10/08/2021 0813   CALCIUM 9.3 10/08/2021 0813   PROT 6.9 08/12/2021 1851   ALBUMIN 3.7 08/12/2021 1851   AST 22 08/12/2021 1851   ALT 29 08/12/2021 1851   ALKPHOS 80 08/12/2021 1851   BILITOT 0.3 08/12/2021 1851   GFRNONAA >60 08/12/2021 1851   GFRAA >60 10/24/2015 0848      Lab Results  Component Value Date   TSH 0.481 08/02/2021     RADIOGRAPHY: CARDIAC CATHETERIZATION  Result Date: 10/09/2021   Prox RCA lesion is 30% stenosed.   Prox Cx to Mid Cx lesion is 20% stenosed.   Mid LAD-1  lesion is 50% stenosed.   Mid LAD-2 lesion is 40% stenosed.   Dist LAD lesion is 30% stenosed. Moderate non-obstructive mid LAD stenosis. This does not appear to be flow limiting. Mild non-obstructive disease in the Circumflex and RCA Recommendations: medical management of non-obstructive CAD     IMPRESSION/PLAN:  This is a delightful patient with head and neck cancer of base of tongue, smoking history and HPV+, (T1 vs T2, N1 M0 Stage). I recommend radiotherapy for this patient.  We discussed the potential risks, benefits, and side effects of radiotherapy. We talked in detail about acute and late effects. We discussed that some of the most bothersome acute effects may be mucositis, dysgeusia, salivary changes, skin irritation, hair loss, dehydration, weight loss and fatigue. We talked about late effects which include but are not necessarily limited to dysphagia, hypothyroidism, nerve injury, vascular injury, spinal cord injury, xerostomia, trismus, neck edema, and potential injury to any of the tissues in the head and neck region. No guarantees of treatment were given. A consent form was signed and placed in the patient's medical record. The patient is enthusiastic about proceeding with treatment. I look forward to participating in the patient's care.    Simulation (treatment planning) will take place after release by dentistry  We also discussed  that the treatment of head and neck cancer is a multidisciplinary process to maximize treatment outcomes and quality of life. For this reason the following referrals have been or will be made:   Medical oncology to discuss chemotherapy (may not be a strong candidate for chemotherapy - pending consultation)   Dentistry for dental evaluation, possible extractions in the radiation fields, and /or advice on reducing risk of cavities, osteoradionecrosis, or other oral issues.   Nutritionist for nutrition support during and after treatment.   Speech language pathology for swallowing and/or speech therapy.   Social work for social support.    Physical therapy due to risk of lymphedema in neck and deconditioning.   I asked the patient today about tobacco use. The patient uses tobacco.  I advised the patient to quit. Services were offered by me today including outpatient counseling and pharmacotherapy. I assessed for the willingness to attempt to quit and provided encouragement and demonstrated willingness to make referrals and/or prescriptions to help the patient attempt to quit. The patient has follow-up with the oncologic team to touch base on their tobacco use and /or cessation efforts.  Over 3 minutes were spent on this issue. He will quit TODAY on his own.  On date of service, in total, I spent 60 minutes on this encounter. Patient was seen in person.  __________________________________________   [name], MD  This document serves as a record of services personally performed by [name], MD. It was created on her behalf by Roney Mans, a trained medical scribe. The creation of this record is based on the scribe's personal observations and the provider's statements to them. This document has been checked and approved by the attending provider.

## 2021-11-04 NOTE — Progress Notes (Signed)
Head and Neck Cancer Location of Tumor / Histology:  Squamous cell carcinoma base of tongue, p16(+)  Patient presented with symptoms of: (from Dr. Janeice Robinson 08/16/21 consult note): "He discovered a neck mass on the left side couple months ago. It has been fluctuating in size but it has remained there and has been noted to be very hard. He has been in and out of the hospital a few times recently with atrial fibrillation"  PET Scan 09/12/2021 --IMPRESSION: Asymmetric hypermetabolic activity in the deep LEFT base of tongue region concerning for primary squamous cell carcinoma. 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 No additional evidence of squamous cell carcinoma metastasis. 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 Benign adrenal adenomas.  Biopsies revealed:  10/30/2021 FINAL MICROSCOPIC DIAGNOSIS:  A. TONGUE, LEFT BASE, BIOPSY:  - Invasive moderately differentiated squamous cell carcinoma.  - Immunohistochemistry for p16 is pending.  - See comment.  COMMENT:  There is a microscopic focus suspicious for lymphovascular space involvement.  ADDENDUM:   Immunohistochemistry for p16 shows diffuse strong positivity.   Nutrition Status Yes No Comments  Weight changes? '[]'$  '[x]'$    Swallowing concerns? '[]'$  '[x]'$    PEG? '[]'$  '[x]'$     Referrals Yes No Comments  Social Work? '[x]'$  '[]'$    Dentistry? '[x]'$  '[]'$    Swallowing therapy? '[x]'$  '[]'$    Nutrition? '[x]'$  '[]'$    Med/Onc? '[]'$  '[x]'$     Safety Issues Yes No Comments  Prior radiation? '[]'$  '[x]'$    Pacemaker/ICD? '[]'$  '[x]'$    Possible current pregnancy? '[]'$  '[x]'$  N/A  Is the patient on methotrexate? '[]'$  '[x]'$     Tobacco/Marijuana/Snuff/ETOH use:  Patient is a current, every day smoker. Denies smokeless tobacco or recreational drug use  Past/Anticipated interventions by otolaryngology, if any:  10/30/2021 --Dr. [name] DIRECT LARYNGOSCOPY WITH BIOPSY ESOPHAGOSCOPY  FROZEN SECTION  Past/Anticipated interventions by  medical oncology, if any:  No referral placed at this time   Current Complaints / other details:  Nothing else of note

## 2021-11-06 ENCOUNTER — Other Ambulatory Visit: Payer: Self-pay

## 2021-11-06 ENCOUNTER — Ambulatory Visit
Admission: RE | Admit: 2021-11-06 | Discharge: 2021-11-06 | Disposition: A | Discharge status: Home / Self Care | Payer: BC Managed Care – PPO | Source: Ambulatory Visit | Attending: Radiation Oncology | Admitting: Radiation Oncology

## 2021-11-06 ENCOUNTER — Telehealth: Payer: Self-pay | Admitting: Hematology and Oncology

## 2021-11-06 ENCOUNTER — Encounter: Payer: Self-pay | Admitting: Radiation Oncology

## 2021-11-06 DIAGNOSIS — Z87442 Personal history of urinary calculi: Secondary | ICD-10-CM | POA: Diagnosis not present

## 2021-11-06 DIAGNOSIS — I10 Essential (primary) hypertension: Secondary | ICD-10-CM | POA: Insufficient documentation

## 2021-11-06 DIAGNOSIS — Z79899 Other long term (current) drug therapy: Secondary | ICD-10-CM | POA: Insufficient documentation

## 2021-11-06 DIAGNOSIS — Z7901 Long term (current) use of anticoagulants: Secondary | ICD-10-CM | POA: Insufficient documentation

## 2021-11-06 DIAGNOSIS — C779 Secondary and unspecified malignant neoplasm of lymph node, unspecified: Secondary | ICD-10-CM | POA: Insufficient documentation

## 2021-11-06 DIAGNOSIS — Z8616 Personal history of COVID-19: Secondary | ICD-10-CM | POA: Diagnosis not present

## 2021-11-06 DIAGNOSIS — R918 Other nonspecific abnormal finding of lung field: Secondary | ICD-10-CM | POA: Insufficient documentation

## 2021-11-06 DIAGNOSIS — E7800 Pure hypercholesterolemia, unspecified: Secondary | ICD-10-CM | POA: Insufficient documentation

## 2021-11-06 DIAGNOSIS — Z7984 Long term (current) use of oral hypoglycemic drugs: Secondary | ICD-10-CM | POA: Insufficient documentation

## 2021-11-06 DIAGNOSIS — C01 Malignant neoplasm of base of tongue: Secondary | ICD-10-CM | POA: Diagnosis not present

## 2021-11-06 DIAGNOSIS — E119 Type 2 diabetes mellitus without complications: Secondary | ICD-10-CM | POA: Diagnosis not present

## 2021-11-06 DIAGNOSIS — K219 Gastro-esophageal reflux disease without esophagitis: Secondary | ICD-10-CM | POA: Diagnosis not present

## 2021-11-06 DIAGNOSIS — F17210 Nicotine dependence, cigarettes, uncomplicated: Secondary | ICD-10-CM | POA: Diagnosis not present

## 2021-11-06 DIAGNOSIS — I2510 Atherosclerotic heart disease of native coronary artery without angina pectoris: Secondary | ICD-10-CM | POA: Insufficient documentation

## 2021-11-06 DIAGNOSIS — I480 Paroxysmal atrial fibrillation: Secondary | ICD-10-CM | POA: Insufficient documentation

## 2021-11-06 NOTE — Progress Notes (Signed)
Oncology Nurse Navigator Documentation   Met with patient during initial consult with Trevor Steele. He was accompanied by his wife and daughter.  Further introduced myself as his/their Navigator, explained my role as a member of the Care Team. Provided New Patient Information packet: Contact information for physician, this navigator, other members of the Care Team Advance Directive information ([place] blue pamphlet with LCSW insert); provided [facility] AD booklet at his request,  Fall Prevention Patient [place] Information sheet Symptom Management Clinic information [facility] campus map with highlight of [place] SLP Information sheet Provided and discussed educational handouts for PEG and PAC. Assisted with post-consult appt scheduling. I toured him to the [facility] treatment area, explained procedures for lobby registration, arrival to Radiation Waiting, and arrival to treatment area.  He voiced understanding.   Discussed the location of Dr. Raynelle Dick office and [facility] Radiology as reference for future appts, including arrival procedure for these appts.   They verbalized understanding of information provided. I encouraged them to call with questions/concerns moving forward.  Harlow Asa, RN, BSN, OCN Head & Neck Oncology Nurse [place] [facility] [place] [phone]

## 2021-11-06 NOTE — Telephone Encounter (Signed)
Scheduled appt per 7/3 staff msg from [employer]. Called pt, no answer. Left msg with appt date/time. Requested for pt to call back to confirm appt. Will try again later.

## 2021-11-08 ENCOUNTER — Other Ambulatory Visit: Payer: Self-pay

## 2021-11-08 ENCOUNTER — Encounter: Payer: Self-pay | Admitting: Hematology and Oncology

## 2021-11-08 ENCOUNTER — Inpatient Hospital Stay: Payer: BC Managed Care – PPO | Attending: Hematology and Oncology | Admitting: Hematology and Oncology

## 2021-11-08 VITALS — BP 134/58 | HR 36 | Temp 97.7°F | Wt 193.7 lb

## 2021-11-08 DIAGNOSIS — E119 Type 2 diabetes mellitus without complications: Secondary | ICD-10-CM | POA: Insufficient documentation

## 2021-11-08 DIAGNOSIS — I480 Paroxysmal atrial fibrillation: Secondary | ICD-10-CM | POA: Diagnosis not present

## 2021-11-08 DIAGNOSIS — Z8616 Personal history of COVID-19: Secondary | ICD-10-CM | POA: Insufficient documentation

## 2021-11-08 DIAGNOSIS — I10 Essential (primary) hypertension: Secondary | ICD-10-CM | POA: Insufficient documentation

## 2021-11-08 DIAGNOSIS — C109 Malignant neoplasm of oropharynx, unspecified: Secondary | ICD-10-CM

## 2021-11-08 DIAGNOSIS — I252 Old myocardial infarction: Secondary | ICD-10-CM | POA: Diagnosis not present

## 2021-11-08 DIAGNOSIS — I2510 Atherosclerotic heart disease of native coronary artery without angina pectoris: Secondary | ICD-10-CM | POA: Diagnosis not present

## 2021-11-08 DIAGNOSIS — Z808 Family history of malignant neoplasm of other organs or systems: Secondary | ICD-10-CM | POA: Diagnosis not present

## 2021-11-08 DIAGNOSIS — I493 Ventricular premature depolarization: Secondary | ICD-10-CM | POA: Diagnosis not present

## 2021-11-08 DIAGNOSIS — C01 Malignant neoplasm of base of tongue: Secondary | ICD-10-CM | POA: Diagnosis not present

## 2021-11-08 DIAGNOSIS — Z8249 Family history of ischemic heart disease and other diseases of the circulatory system: Secondary | ICD-10-CM | POA: Diagnosis not present

## 2021-11-08 DIAGNOSIS — Z7901 Long term (current) use of anticoagulants: Secondary | ICD-10-CM | POA: Insufficient documentation

## 2021-11-08 DIAGNOSIS — Z79899 Other long term (current) drug therapy: Secondary | ICD-10-CM | POA: Insufficient documentation

## 2021-11-08 DIAGNOSIS — F17210 Nicotine dependence, cigarettes, uncomplicated: Secondary | ICD-10-CM | POA: Insufficient documentation

## 2021-11-08 DIAGNOSIS — Z87442 Personal history of urinary calculi: Secondary | ICD-10-CM | POA: Diagnosis not present

## 2021-11-08 NOTE — Progress Notes (Signed)
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 Patient denies any new complaints today.  He noticed this neck mass back in March 2023 when he was hospitalized for A-fib management.  He also has underlying coronary artery disease, had prior evidence of myocardial infarction on myocardial perfusion scan, had a baseline echocardiogram in March 2023 with EF of 45 to 50%, continues on anticoagulation for atrial fibrillation.  His most recent myocardial perfusion scan estimated the EF at 23% however comments on poor quality as well as some PVCs which could have underestimated the ejection fraction.  He has well-controlled diabetes mellitus as well as hypertension at baseline.  Given T2 N1 with multiple ipsilateral lymph nodes less than adequately to 6 cm, we have discussed that the standard of care is to consider concurrent chemotherapy and radiation.  We have discussed about weekly cisplatin, myocardial infarction, adverse effects including but not limited to fatigue, nausea, vomiting, increased risk of infections, ototoxicity, neuropathy, nephropathy.  He understands that some of the side effects can be life-threatening and permanent.  Understands that when we combined chemotherapy and radiation, he may experience mucositis which will warrant G-tube placement for nourishment purposes.  He may also experience severe pain from mucositis and he may need  pain medication during the chemo therapy.  He is very nervous about considering chemotherapy.  He would like to think about this.  I have also recommended we repeat another echo to confirm myocardial perfusion scans findings.  This has been ordered.  He will let our navigator or ourselves know if he is willing to consider chemotherapy and radiation.  If he is not willing to consider chemotherapy and radiation, he will proceed with radiation alone and return to clinic for surveillance with Dr. Isidore Moos and Dr. Constance Holster.  He understands that chemotherapy and radiation together are superior then definitive radiation alone. Thank you for consulting [place] in the care of this patient.  Please not hesitate contact us with any additional questions or concerns.  Orders Placed This Encounter  Procedures   CBC with Differential/Platelet    Standing Status:   Standing    Number of Occurrences:   22    Standing Expiration Date:   11/09/2022   Comprehensive metabolic panel    Standing Status:   Standing    Number of Occurrences:   33    Standing Expiration Date:   11/09/2022   ECHOCARDIOGRAM COMPLETE    Standing Status:   Future    Standing Expiration Date:   11/09/2022    Order Specific Question:   Where should this test be performed    Answer:   [facility]    Order Specific Question:   Perflutren DEFINITY (image enhancing agent) should be administered unless hypersensitivity or allergy exist    Answer:   Administer Perflutren    Order Specific Question:   Is a special reader required? (athlete or structural heart)    Answer:   No    Order Specific Question:  Does this study need to be read by the Structural team/Level 3 readers?    Answer:   No    Order Specific Question:   Reason for exam-Echo    Answer:   Chemo  Z09     HISTORY OF PRESENTING ILLNESS:  Trevor Steele 61 y.o. male is here because of SCC oropharynx.  This is a very pleasant 61 year old male patient with past medical history  significant for atrial fibrillation, coronary artery disease who was found to have a left neck mass in March 2023 when he was hospitalized for management of atrial fibrillation.  He had some further investigation with CT neck which shows an large central left level 2A lymph node which is necrotic concerning for nodal metastasis.  He after this had further investigation with Dr. Constance Holster when he reported the neck mass to fluctuate.  He had an FNA of the neck mass which did not show definitive evidence of malignancy.  He had systemic staging with PET/CT which once again demonstrated asymmetric hypermetabolic activity in the left tongue base as well as already hypermetabolic metastatic left level 2 lymph node.  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  He also had a direct laryngoscopy after this which confirmed left tongue base invasive moderately differentiated squamous cell carcinoma p16 positive.  He does have underlying coronary artery disease, recently had cardiac cath which revealed nonobstructive coronary artery disease.   He had a myocardial perfusion scan which suggested an EF of 23% but this could be underestimated because of poor quality of the scan and multiple PVCs.  There was however evidence of previous myocardial infarction.  He was a heavy smoker but recently quit smoking.  He was never a heavy drinker.  He has well-controlled diabetes mellitus, last hemoglobin A1c less than 6.5,, well-controlled hypertension and atrial fibrillation on anticoagulation.  He denies any complaints today except for the left neck mass.  Rest of the pertinent 10 point ROS reviewed and negative  REVIEW OF SYSTEMS:   Constitutional: Denies fevers, chills or abnormal night sweats Eyes: Denies blurriness of vision, double vision or watery eyes Ears, nose, mouth, throat, and face: Denies mucositis or sore throat Respiratory: Denies cough,  dyspnea or wheezes Cardiovascular: Denies palpitation, chest discomfort or lower extremity swelling Gastrointestinal:  Denies nausea, heartburn or change in bowel habits Skin: Denies abnormal skin rashes Lymphatics: Denies new lymphadenopathy or easy bruising Neurological:Denies numbness, tingling or new weaknesses Behavioral/Psych: Mood is stable, no new changes  All other systems were reviewed with the patient and are negative.  MEDICAL HISTORY:  Past Medical History:  Diagnosis Date   Agatston coronary artery calcium score greater than 400    coronary calcium score high at 1183 which is 35 percentile for age, race and sex matched controls.   Aortic atherosclerosis (HCC)    Aortic stenosis    COVID 06/28/2021   no symptoms, went to hospital with a-fib and diagnosed then   Diabetes mellitus without complication ([facility])    Dysrhythmia    A-fib   GERD (gastroesophageal reflux disease)    High cholesterol    History of kidney stones    Hypertension    Mild dilation of ascending aorta ([place])    by echo 07/2021 though not corroborated on CT   Mitral regurgitation    Neck mass    Abnormal neck CT 07/2021 concerning for nodal metastasis   PAF (paroxysmal atrial fibrillation) ([place])  Pneumonia    Pulmonary nodules    PVC's (premature ventricular contractions)     SURGICAL HISTORY: Past Surgical History:  Procedure Laterality Date   BACK SURGERY     BACK SURGERY     lumbar   LARYNGOSCOPY AND ESOPHAGOSCOPY N/A 10/30/2021   Procedure: DIRECT LARYNGOSCOPY WITH BIOPSY; ESOPHAGOSCOPY;  Surgeon: [name], MD;  Location: [facility];  Service: ENT;  Laterality: N/A;   LEFT HEART CATH AND CORONARY ANGIOGRAPHY N/A 10/09/2021   Procedure: LEFT HEART CATH AND CORONARY ANGIOGRAPHY;  Surgeon: Burnell Blanks, MD;  Location: [facility];  Service: Cardiovascular;  Laterality: N/A;   TONSILLECTOMY Bilateral 10/30/2021   Procedure: TONSILLECTOMY; FROZEN SECTION;  Surgeon: [name],  MD;  Location: [facility] OR;  Service: ENT;  Laterality: Bilateral;    SOCIAL HISTORY: Social History   Socioeconomic History   Marital status: Married    Spouse name: Not on file   Number of children: Not on file   Years of education: Not on file   Highest education level: Not on file  Occupational History   Not on file  Tobacco Use   Smoking status: Every Day    Packs/day: 0.50    Types: Cigarettes    Last attempt to quit: 07/09/2021    Years since quitting: 0.3   Smokeless tobacco: Not on file  Vaping Use   Vaping Use: Former  Substance and Sexual Activity   Alcohol use: Not Currently   Drug use: No   Sexual activity: Yes    Birth control/protection: None  Other Topics Concern   Not on file  Social History Narrative   Not on file   Social Determinants of Health   Financial Resource Strain: Not on file  Food Insecurity: Not on file  Transportation Needs: Not on file  Physical Activity: Not on file  Stress: Not on file  Social Connections: Not on file  Intimate Partner Violence: Not on file    FAMILY HISTORY: Family History  Problem Relation Age of Onset   Coronary artery disease Mother    Coronary artery disease Father    Mesothelioma Father    CAD Neg Hx        negative for premature CAD    ALLERGIES:  has No Known Allergies.  MEDICATIONS:  Current Outpatient Medications  Medication Sig Dispense Refill   acetaminophen (TYLENOL) 500 MG tablet Take 500 mg by mouth every 6 (six) hours as needed for mild pain.     apixaban (ELIQUIS) 5 MG TABS tablet Take 5 mg by mouth 2 (two) times daily.     atorvastatin (LIPITOR) 40 MG tablet Take 1 tablet (40 mg total) by mouth daily. 90 tablet 3   diltiazem (CARDIZEM) 30 MG tablet Take 1 tablet (30 mg total) by mouth every 6 (six) hours as needed (palpitations). 30 tablet 1   empagliflozin (JARDIANCE) 10 MG TABS tablet Take 1 tablet (10 mg total) by mouth daily before breakfast. 90 tablet 3   esomeprazole (NEXIUM) 40 MG  capsule Take 40 mg by mouth daily.  12   flecainide (TAMBOCOR) 50 MG tablet Take 1 tablet (50 mg total) by mouth 2 (two) times daily. 60 tablet 11   glimepiride (AMARYL) 1 MG tablet Take 1 mg by mouth every evening.     lisinopril (ZESTRIL) 10 MG tablet Take 1 tablet (10 mg total) by mouth daily. 30 tablet 1   metFORMIN (GLUCOPHAGE) 1000 MG tablet Take 1,000 mg by mouth 2 (two) times daily.  12   metoprolol succinate (TOPROL-XL) 50 MG 24 hr tablet Take 1 tablet (50 mg total) by mouth in the morning and at bedtime. Take with or immediately following a meal. 60 tablet 0   Multiple Vitamin (MULTIVITAMIN WITH MINERALS) TABS tablet Take 1 tablet by mouth daily.     Current Facility-Administered Medications  Medication Dose Route Frequency Provider Last Rate Last Admin   sodium chloride flush (NS) 0.9 % injection 3 mL  3 mL Intravenous Q12H Imogene Burn, PA-C         PHYSICAL EXAMINATION: ECOG PERFORMANCE STATUS: 0 - Asymptomatic  Vitals:   11/08/21 1234 11/08/21 1235  BP: (!) 134/58   Pulse: (!) 25 (!) 36  Temp: 97.7 F (36.5 C)   SpO2: 100%    Filed Weights   11/08/21 1234  Weight: 193 lb 11.2 oz (87.9 kg)    GENERAL:alert, no distress and comfortable SKIN: skin color, texture, turgor are normal, no rashes or significant lesions EYES: normal, conjunctiva are pink and non-injected, sclera clear OROPHARYNX:no exudate, no erythema and lips, buccal mucosa, and tongue normal  NECK: Palpable left cervical lymph node on exam today  LYMPH:  no palpable lymphadenopathy in the axillary or inguinal LUNGS: clear to auscultation and percussion with normal breathing effort HEART: Pulse is slow, irregularly irregular ABDOMEN:abdomen soft, non-tender and normal bowel sounds Musculoskeletal:no cyanosis of digits and no clubbing  PSYCH: alert & oriented x 3 with fluent speech NEURO: no focal motor/sensory deficits  LABORATORY DATA:  I have reviewed the data as listed Lab Results   Component Value Date   WBC 10.0 10/08/2021   HGB 14.7 10/08/2021   HCT 43.7 10/08/2021   MCV 92 10/08/2021   PLT 234 10/08/2021     Chemistry      Component Value Date/Time   NA 139 10/08/2021 0813   K 4.7 10/08/2021 0813   CL 103 10/08/2021 0813   CO2 21 10/08/2021 0813   BUN 21 10/08/2021 0813   CREATININE 0.96 10/08/2021 0813      Component Value Date/Time   CALCIUM 9.3 10/08/2021 0813   ALKPHOS 80 08/12/2021 1851   AST 22 08/12/2021 1851   ALT 29 08/12/2021 1851   BILITOT 0.3 08/12/2021 1851       RADIOGRAPHIC STUDIES: I have personally reviewed the radiological images as listed and agreed with the findings in the report. No results found.  All questions were answered. The patient knows to call the clinic with any problems, questions or concerns. I spent 65 minutes in the care of this patient including H and P, review of records, counseling and coordination of care.     Benay Pike, MD 11/08/2021 3:28 PM

## 2021-11-08 NOTE — Progress Notes (Signed)
Oncology Nurse Navigator Documentation   Met with patient after initial consult with Dr. Chryl Heck. They are aware that I have referred him to Dr. Benson Norway and they should get a call for an appointment early next week. He will also have a Echo done arranged by Dr. Chryl Heck. I plan to call him 7/13 to discuss his willingness to receive chemotherapy.  Harlow Asa, RN, BSN, OCN Head & Neck Oncology Nurse [place] [phone]

## 2021-11-08 NOTE — Assessment & Plan Note (Signed)
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 Patient denies any new complaints today.  He noticed this neck mass back in March 2023 when he was hospitalized for A-fib management.  He also has underlying coronary artery disease, had prior evidence of myocardial infarction on myocardial perfusion scan, had a baseline echocardiogram in March 2023 with EF of 45 to 50%, continues on anticoagulation for atrial fibrillation.  His most recent myocardial perfusion scan estimated the EF at 23% however comments on poor quality as well as some PVCs which could have underestimated the ejection fraction.  He has well-controlled diabetes mellitus as well as hypertension at baseline.  Given T2 N1 with multiple ipsilateral lymph nodes less than adequately to 6 cm, we have discussed that the standard of care is to consider concurrent chemotherapy and radiation.  We have discussed about weekly cisplatin, myocardial infarction, adverse effects including but not limited to fatigue, nausea, vomiting, increased risk of infections, ototoxicity, neuropathy, nephropathy.  He understands that some of the side effects can be life-threatening and permanent.  Understands that when we combined chemotherapy and radiation, he may experience mucositis which will warrant G-tube placement for nourishment purposes.  He may also experience severe pain from mucositis and he may need pain medication during the chemo therapy.  He is very nervous about considering chemotherapy.  He would like to think about this.  I have also recommended we repeat another echo to confirm myocardial perfusion scans findings.  This has been ordered.  He will let our navigator or ourselves know if he is willing to consider chemotherapy and  radiation.  If he is not willing to consider chemotherapy and radiation, he will proceed with radiation alone and return to clinic for surveillance with Dr. Isidore Moos and Dr. Constance Holster.  He understands that chemotherapy and radiation together are superior then definitive radiation alone. Thank you for consulting [place] in the care of this patient.  Please not hesitate contact us with any additional questions or concerns.

## 2021-11-08 NOTE — Addendum Note (Signed)
Encounter addended by: [name], MD on: 11/08/2021 7:26 AM  Actions taken: Clinical Note Signed

## 2021-11-11 ENCOUNTER — Telehealth: Payer: Self-pay | Admitting: Radiology

## 2021-11-11 NOTE — Telephone Encounter (Signed)
Patient sent a MyChart message- see below.  Please advise on medication refill?  Rx ordered per last refill.  I will reply back to patient to confirm we cancelled tomorrow 7/11 appt.   [name] to [employer] (supporting Mychart, Generic)       11/11/21  1:17 PM I will need to cancel my appointment on 11-12-21. I am about to start my cancer treatment at [facility]. I have 2 appointments there this Week for mask fitting and dental.  Thanks for the help the great staff gave me for my shoulder. i would like to get my meds refilled if acceptably with Dr Luna Glasgow. my treatments should start on 12-02-21 its 35 treatment 5 days a week for 7 weeks or radiation.

## 2021-11-12 ENCOUNTER — Ambulatory Visit: Payer: BC Managed Care – PPO | Admitting: Orthopaedic Surgery

## 2021-11-12 MED ORDER — HYDROCODONE-ACETAMINOPHEN 5-325 MG PO TABS: 1.0000 | ORAL_TABLET | Freq: Four times a day (QID) | ORAL | 0 refills | Status: DC | PRN

## 2021-11-13 ENCOUNTER — Inpatient Hospital Stay: Payer: BC Managed Care – PPO | Admitting: Hematology and Oncology

## 2021-11-14 ENCOUNTER — Telehealth: Payer: BC Managed Care – PPO | Admitting: Hematology and Oncology

## 2021-11-15 ENCOUNTER — Inpatient Hospital Stay (HOSPITAL_BASED_OUTPATIENT_CLINIC_OR_DEPARTMENT_OTHER): Payer: BC Managed Care – PPO | Admitting: Hematology and Oncology

## 2021-11-15 ENCOUNTER — Encounter: Payer: Self-pay | Admitting: Hematology and Oncology

## 2021-11-15 ENCOUNTER — Telehealth: Payer: Self-pay | Admitting: Licensed Clinical Social Worker

## 2021-11-15 ENCOUNTER — Ambulatory Visit (HOSPITAL_COMMUNITY)
Admission: RE | Admit: 2021-11-15 | Discharge: 2021-11-15 | Disposition: A | Discharge status: Home / Self Care | Payer: BC Managed Care – PPO | Source: Ambulatory Visit | Attending: Hematology and Oncology | Admitting: Hematology and Oncology

## 2021-11-15 DIAGNOSIS — I4891 Unspecified atrial fibrillation: Secondary | ICD-10-CM | POA: Diagnosis not present

## 2021-11-15 DIAGNOSIS — C01 Malignant neoplasm of base of tongue: Secondary | ICD-10-CM

## 2021-11-15 DIAGNOSIS — K219 Gastro-esophageal reflux disease without esophagitis: Secondary | ICD-10-CM | POA: Insufficient documentation

## 2021-11-15 DIAGNOSIS — I10 Essential (primary) hypertension: Secondary | ICD-10-CM | POA: Insufficient documentation

## 2021-11-15 DIAGNOSIS — Z0189 Encounter for other specified special examinations: Secondary | ICD-10-CM

## 2021-11-15 DIAGNOSIS — C109 Malignant neoplasm of oropharynx, unspecified: Secondary | ICD-10-CM | POA: Diagnosis not present

## 2021-11-15 DIAGNOSIS — E119 Type 2 diabetes mellitus without complications: Secondary | ICD-10-CM | POA: Diagnosis not present

## 2021-11-15 DIAGNOSIS — I493 Ventricular premature depolarization: Secondary | ICD-10-CM | POA: Insufficient documentation

## 2021-11-15 LAB — ECHOCARDIOGRAM COMPLETE
AR max vel: 1.22 cm2
AV Area VTI: 1.27 cm2
AV Area mean vel: 1.26 cm2
AV Mean grad: 19.7 mmHg
AV Peak grad: 37.7 mmHg
Ao pk vel: 3.07 m/s
Calc EF: 42.9 %
P 1/2 time: 454 msec
S' Lateral: 4.8 cm
Single Plane A2C EF: 41.5 %
Single Plane A4C EF: 44.8 %

## 2021-11-15 NOTE — Progress Notes (Signed)
  Echocardiogram 2D Echocardiogram has been performed.  Fidel Levy 11/15/2021, 11:17 AM

## 2021-11-15 NOTE — Assessment & Plan Note (Addendum)
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  Patient denies any new complaints today.  He noticed this neck mass back in March 2023 when he was hospitalized for A-fib management.  He also has underlying coronary artery disease, had prior evidence of myocardial infarction on myocardial perfusion scan, had a baseline echocardiogram in March 2023 with EF of 45 to 50%, continues on anticoagulation for atrial fibrillation.  His most recent myocardial perfusion scan estimated the EF at 23% however comments on poor quality as well as some PVCs which could have underestimated the ejection fraction. Repeat ECHO today with EF of 40-45%  He has well-controlled diabetes mellitus as well as hypertension at baseline.  Given T2 N1 with multiple ipsilateral lymph nodes less than  6 cm, we have discussed that the standard of care is to consider concurrent chemotherapy and radiation.  We have discussed about weekly cisplatin, myocardial infarction, adverse effects including but not limited to fatigue, nausea, vomiting, increased risk of infections, ototoxicity, neuropathy, nephropathy.  He understands that some of the side effects can be life-threatening and permanent.  We once again talked about role of chemoradiation but he is still undecided if he wants to consider chemotherapy.  He was informed to reach out to our nurse navigator next week with his decision and he expressed understanding.  If he were to agree to concurrent chemoradiation, we will try to coordinate the care with radiation oncology.  He expressed understanding of recommendations.

## 2021-11-15 NOTE — Telephone Encounter (Signed)
[place] Work  Clinical Social Work was referred by  new pt protocol  for assessment of psychosocial needs.  Clinical Social Worker attempted to contact patient by phone  to offer support and assess for needs.  No answer. Left VM with direct contact information.      [place], [place] Worker [employer]

## 2021-11-15 NOTE — Progress Notes (Signed)
[facility] CONSULT NOTE  Patient Care Team: [name], MD as PCP - General (Internal Medicine) Early Osmond, MD as PCP - Cardiology (Cardiology)  CHIEF COMPLAINTS/PURPOSE OF CONSULTATION:  Squamous cell carcinoma of the oropharynx, HPV positive  ASSESSMENT & PLAN:   Patient Active Problem List   Diagnosis Date Noted   Malignant neoplasm of base of tongue ([place]) 11/06/2021   Abnormal stress test    Atrial fibrillation with RVR ([place]) 08/02/2021   Type 2 diabetes mellitus ([place]) 08/02/2021   Essential hypertension 08/02/2021   Anticoagulated (apixaban) 08/02/2021   Hyperlipidemia associated with type 2 diabetes mellitus ([place]) 08/02/2021   High risk medication use (pioglitazone) 08/02/2021   Intermittent heart palpitations 08/02/2021   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  Patient denies any new complaints today.  He noticed this neck mass back in March 2023 when he was hospitalized for A-fib management.  He also has underlying coronary artery disease, had prior evidence of myocardial infarction on myocardial perfusion scan, had a baseline echocardiogram in March 2023 with EF of 45 to 50%, continues on anticoagulation for atrial fibrillation.  His most recent myocardial perfusion scan estimated the EF at 23% however comments on poor quality as well as some PVCs which could have underestimated the ejection fraction. Repeat ECHO today with EF of 40-45%  He has well-controlled diabetes mellitus as well as hypertension at baseline.  Given T2 N1 with multiple ipsilateral lymph nodes less than  6 cm, we have  discussed that the standard of care is to consider concurrent chemotherapy and radiation.  We have discussed about weekly cisplatin, myocardial infarction, adverse effects including but not limited to fatigue, nausea, vomiting, increased risk of infections, ototoxicity, neuropathy, nephropathy.  He understands that some of the side effects can be life-threatening and permanent.  We once again talked about role of chemoradiation but he is still undecided if he wants to consider chemotherapy.  He was informed to reach out to our nurse navigator next week with his decision and he expressed understanding.  If he were to agree to concurrent chemoradiation, we will try to coordinate the care with radiation oncology.  He expressed understanding of recommendations.       HISTORY OF PRESENTING ILLNESS:  Trevor Steele 61 y.o. male is here because of SCC oropharynx.  This is a very pleasant 61 year old male patient with past medical history significant for atrial fibrillation, coronary artery disease who was found to have a left neck mass in March 2023 when he was hospitalized for management of atrial fibrillation.  He had some further investigation with CT neck which shows an large central left level 2A lymph node which is necrotic concerning for nodal metastasis.  He after this had further investigation with Dr. Constance Holster when he reported the neck mass to fluctuate.  He had an FNA of the neck mass which did not show definitive evidence of malignancy.  He had systemic staging with PET/CT which once again demonstrated asymmetric hypermetabolic activity in the left tongue base as well as already hypermetabolic metastatic left level 2 lymph node.  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  He also had a direct laryngoscopy after this which confirmed left tongue base invasive moderately differentiated squamous cell carcinoma p16 positive.  He  does have underlying coronary artery disease, recently had cardiac cath which revealed nonobstructive coronary artery disease.   He had a myocardial perfusion scan which suggested an EF of 23% but this could be underestimated because of poor quality of the scan and multiple PVCs.  There was however evidence of previous myocardial infarction.  He was a heavy smoker but recently quit smoking.  He was never a heavy drinker.  He has well-controlled diabetes mellitus, last hemoglobin A1c less than 6.5,, well-controlled hypertension and atrial fibrillation on anticoagulation.  He denies any complaints today except for the left neck mass.   Interval history He is here for telephone visit since he could not make a decision last time we met about role of chemotherapy in his treatment.  He once again is still undecided.  He had an echocardiogram today.  He denies any change in the left neck mass    Rest of the pertinent 10 point ROS reviewed and negative  REVIEW OF SYSTEMS:   Constitutional: Denies fevers, chills or abnormal night sweats Eyes: Denies blurriness of vision, double vision or watery eyes Ears, nose, mouth, throat, and face: Denies mucositis or sore throat Respiratory: Denies cough, dyspnea or wheezes Cardiovascular: Denies palpitation, chest discomfort or lower extremity swelling Gastrointestinal:  Denies nausea, heartburn or change in bowel habits Skin: Denies abnormal skin rashes Lymphatics: Denies new lymphadenopathy or easy bruising Neurological:Denies numbness, tingling or new weaknesses Behavioral/Psych: Mood is stable, no new changes  All other systems were reviewed with the patient and are negative.  MEDICAL HISTORY:  Past Medical History:  Diagnosis Date   Agatston coronary artery calcium score greater than 400    coronary calcium score high at 1183 which is 34 percentile for age, race and sex matched controls.   Aortic atherosclerosis (HCC)    Aortic stenosis    COVID  06/28/2021   no symptoms, went to hospital with a-fib and diagnosed then   Diabetes mellitus without complication ([place])    Dysrhythmia    A-fib   GERD (gastroesophageal reflux disease)    High cholesterol    History of kidney stones    Hypertension    Mild dilation of ascending aorta ([place])    by echo 07/2021 though not corroborated on CT   Mitral regurgitation    Neck mass    Abnormal neck CT 07/2021 concerning for nodal metastasis   PAF (paroxysmal atrial fibrillation) (HCC)    Pneumonia    Pulmonary nodules    PVC's (premature ventricular contractions)     SURGICAL HISTORY: Past Surgical History:  Procedure Laterality Date   BACK SURGERY     BACK SURGERY     lumbar   LARYNGOSCOPY AND ESOPHAGOSCOPY N/A 10/30/2021   Procedure: DIRECT LARYNGOSCOPY WITH BIOPSY; ESOPHAGOSCOPY;  Surgeon: [name], MD;  Location: [facility];  Service: ENT;  Laterality: N/A;   LEFT HEART CATH AND CORONARY ANGIOGRAPHY N/A 10/09/2021   Procedure: LEFT HEART CATH AND CORONARY ANGIOGRAPHY;  Surgeon: Burnell Blanks, MD;  Location: [facility];  Service: Cardiovascular;  Laterality: N/A;   TONSILLECTOMY Bilateral 10/30/2021   Procedure: TONSILLECTOMY; FROZEN SECTION;  Surgeon: [name], MD;  Location: [facility];  Service: ENT;  Laterality: Bilateral;    SOCIAL HISTORY: Social History   Socioeconomic History  Marital status: Married    Spouse name: Not on file   Number of children: Not on file   Years of education: Not on file   Highest education level: Not on file  Occupational History   Not on file  Tobacco Use   Smoking status: Every Day    Packs/day: 0.50    Types: Cigarettes    Last attempt to quit: 07/09/2021    Years since quitting: 0.3   Smokeless tobacco: Not on file  Vaping Use   Vaping Use: Former  Substance and Sexual Activity   Alcohol use: Not Currently   Drug use: No   Sexual activity: Yes    Birth control/protection: None  Other Topics Concern   Not on file   Social History Narrative   Not on file   Social Determinants of Health   Financial Resource Strain: Not on file  Food Insecurity: Not on file  Transportation Needs: Not on file  Physical Activity: Not on file  Stress: Not on file  Social Connections: Not on file  Intimate Partner Violence: Not on file    FAMILY HISTORY: Family History  Problem Relation Age of Onset   Coronary artery disease Mother    Coronary artery disease Father    Mesothelioma Father    CAD Neg Hx        negative for premature CAD    ALLERGIES:  has No Known Allergies.  MEDICATIONS:  Current Outpatient Medications  Medication Sig Dispense Refill   acetaminophen (TYLENOL) 500 MG tablet Take 500 mg by mouth every 6 (six) hours as needed for mild pain.     apixaban (ELIQUIS) 5 MG TABS tablet Take 5 mg by mouth 2 (two) times daily.     atorvastatin (LIPITOR) 40 MG tablet Take 1 tablet (40 mg total) by mouth daily. 90 tablet 3   diltiazem (CARDIZEM) 30 MG tablet Take 1 tablet (30 mg total) by mouth every 6 (six) hours as needed (palpitations). 30 tablet 1   empagliflozin (JARDIANCE) 10 MG TABS tablet Take 1 tablet (10 mg total) by mouth daily before breakfast. 90 tablet 3   esomeprazole (NEXIUM) 40 MG capsule Take 40 mg by mouth daily.  12   flecainide (TAMBOCOR) 50 MG tablet Take 1 tablet (50 mg total) by mouth 2 (two) times daily. 60 tablet 11   glimepiride (AMARYL) 1 MG tablet Take 1 mg by mouth every evening.     HYDROcodone-acetaminophen (NORCO/VICODIN) 5-325 MG tablet Take 1 tablet by mouth every 6 (six) hours as needed for moderate pain. 28 tablet 0   lisinopril (ZESTRIL) 10 MG tablet Take 1 tablet (10 mg total) by mouth daily. 30 tablet 1   metFORMIN (GLUCOPHAGE) 1000 MG tablet Take 1,000 mg by mouth 2 (two) times daily.  12   metoprolol succinate (TOPROL-XL) 50 MG 24 hr tablet Take 1 tablet (50 mg total) by mouth in the morning and at bedtime. Take with or immediately following a meal. 60 tablet 0    Multiple Vitamin (MULTIVITAMIN WITH MINERALS) TABS tablet Take 1 tablet by mouth daily.     Current Facility-Administered Medications  Medication Dose Route Frequency Provider Last Rate Last Admin   sodium chloride flush (NS) 0.9 % injection 3 mL  3 mL Intravenous Q12H Imogene Burn, PA-C         PHYSICAL EXAMINATION: ECOG PERFORMANCE STATUS: 0 - Asymptomatic  There were no vitals filed for this visit.  There were no vitals filed for this visit. Physical  examination not done, telephone visit  LABORATORY DATA:  I have reviewed the data as listed Lab Results  Component Value Date   WBC 10.0 10/08/2021   HGB 14.7 10/08/2021   HCT 43.7 10/08/2021   MCV 92 10/08/2021   PLT 234 10/08/2021     Chemistry      Component Value Date/Time   NA 139 10/08/2021 0813   K 4.7 10/08/2021 0813   CL 103 10/08/2021 0813   CO2 21 10/08/2021 0813   BUN 21 10/08/2021 0813   CREATININE 0.96 10/08/2021 0813      Component Value Date/Time   CALCIUM 9.3 10/08/2021 0813   ALKPHOS 80 08/12/2021 1851   AST 22 08/12/2021 1851   ALT 29 08/12/2021 1851   BILITOT 0.3 08/12/2021 1851       RADIOGRAPHIC STUDIES: I have personally reviewed the radiological images as listed and agreed with the findings in the report. ECHOCARDIOGRAM COMPLETE  Result Date: 11/15/2021    ECHOCARDIOGRAM REPORT   Patient Name:   Trevor Steele Date of Exam: 11/15/2021 Medical Rec #:  [id number]        Height:       71.0 in Accession #:    [id number]       Weight:       193.7 lb Date of Birth:  1960-11-10        BSA:          2.080 m Patient Age:    1 years         BP:           134/58 mmHg Patient Gender: M                HR:           68 bpm. Exam Location:  Outpatient Procedure: 2D Echo, Cardiac Doppler, Color Doppler and Strain Analysis Indications:    Chemo Z09  History:        Patient has prior history of Echocardiogram examinations, most                 recent 07/31/2021. Arrythmias:Atrial Fibrillation; Risk                  Factors:Hypertension, Diabetes and GERD.  Sonographer:    Bernadene Person RDCS Referring Phys: [id number] [place]  1. Wall motion assessment/LVEF challenging with frequent PVCs.. Left ventricular ejection fraction, by estimation, is 40 to 45%. The left ventricle has mildly decreased function. The left ventricle demonstrates global hypokinesis. The left ventricular internal cavity size was mildly dilated. Left ventricular diastolic parameters are indeterminate.  2. Right ventricular systolic function is mildly reduced. The right ventricular size is mildly enlarged. There is normal pulmonary artery systolic pressure.  3. The mitral valve is normal in structure. No evidence of mitral valve regurgitation.  4. The aortic valve is calcified. Aortic valve regurgitation is trivial. Mild to moderate aortic valve stenosis.  5. The inferior vena cava is normal in size with greater than 50% respiratory variability, suggesting right atrial pressure of 3 mmHg. Comparison(s): No significant change from prior study. FINDINGS  Left Ventricle: Wall motion assessment/LVEF challenging with frequent PVCs. Left ventricular ejection fraction, by estimation, is 40 to 45%. The left ventricle has mildly decreased function. The left ventricle demonstrates global hypokinesis. The left ventricular internal cavity size was mildly dilated. There is no left ventricular hypertrophy. Left ventricular diastolic parameters are indeterminate. Right Ventricle: The right ventricular size is mildly enlarged. No increase  in right ventricular wall thickness. Right ventricular systolic function is mildly reduced. There is normal pulmonary artery systolic pressure. The tricuspid regurgitant velocity  is 2.14 m/s, and with an assumed right atrial pressure of 3 mmHg, the estimated right ventricular systolic pressure is 97.0 mmHg. Left Atrium: Left atrial size was normal in size. Right Atrium: Right atrial size was normal in size.  Pericardium: There is no evidence of pericardial effusion. Mitral Valve: The mitral valve is normal in structure. No evidence of mitral valve regurgitation. Tricuspid Valve: The tricuspid valve is normal in structure. Tricuspid valve regurgitation is trivial. Aortic Valve: The aortic valve is calcified. Aortic valve regurgitation is trivial. Aortic regurgitation PHT measures 454 msec. Mild to moderate aortic stenosis is present. Aortic valve mean gradient measures 19.7 mmHg. Aortic valve peak gradient measures 37.7 mmHg. Aortic valve area, by VTI measures 1.27 cm. Pulmonic Valve: The pulmonic valve was not well visualized. Pulmonic valve regurgitation is not visualized. Aorta: The aortic root and ascending aorta are structurally normal, with no evidence of dilitation. Venous: The inferior vena cava is normal in size with greater than 50% respiratory variability, suggesting right atrial pressure of 3 mmHg. IAS/Shunts: No atrial level shunt detected by color flow Doppler.  LEFT VENTRICLE PLAX 2D LVIDd:         5.50 cm LVIDs:         4.80 cm LV PW:         1.10 cm LV IVS:        0.90 cm LVOT diam:     2.10 cm LV SV:         84 LV SV Index:   41 LVOT Area:     3.46 cm  LV Volumes (MOD) LV vol d, MOD A2C: 162.0 ml LV vol d, MOD A4C: 155.0 ml LV vol s, MOD A2C: 94.7 ml LV vol s, MOD A4C: 85.6 ml LV SV MOD A2C:     67.3 ml LV SV MOD A4C:     155.0 ml LV SV MOD BP:      67.7 ml RIGHT VENTRICLE RV S prime:     12.70 cm/s TAPSE (M-mode): 2.0 cm LEFT ATRIUM             Index        RIGHT ATRIUM           Index LA diam:        4.00 cm 1.92 cm/m   RA Area:     15.80 cm LA Vol (A2C):   64.2 ml 30.86 ml/m  RA Volume:   38.70 ml  18.61 ml/m LA Vol (A4C):   47.7 ml 22.93 ml/m LA Biplane Vol: 55.4 ml 26.63 ml/m  AORTIC VALVE AV Area (Vmax):    1.22 cm AV Area (Vmean):   1.26 cm AV Area (VTI):     1.27 cm AV Vmax:           307.00 cm/s AV Vmean:          205.000 cm/s AV VTI:            0.664 m AV Peak Grad:      37.7 mmHg  AV Mean Grad:      19.7 mmHg LVOT Vmax:         108.50 cm/s LVOT Vmean:        74.700 cm/s LVOT VTI:          0.244 m LVOT/AV VTI ratio: 0.37 AI PHT:  454 msec  AORTA Ao Root diam: 3.80 cm Ao Asc diam:  3.80 cm TRICUSPID VALVE TR Peak grad:   18.3 mmHg TR Vmax:        214.00 cm/s  SHUNTS Systemic VTI:  0.24 m Systemic Diam: 2.10 cm Phineas Inches Electronically signed by Phineas Inches Signature Date/Time: 11/15/2021/1:28:58 PM    Final     All questions were answered. The patient knows to call the clinic with any problems, questions or concerns. I spent 10 minutes in the care of this patient including H and P, review of records, counseling and coordination of care.   I connected with  [name] on 11/15/21 by a telephone application and verified that I am speaking with the correct person using two identifiers.   I discussed the limitations of evaluation and management by telemedicine. The patient expressed understanding and agreed to proceed.   Benay Pike, MD 11/15/2021 3:06 PM

## 2021-11-19 ENCOUNTER — Ambulatory Visit (INDEPENDENT_AMBULATORY_CARE_PROVIDER_SITE_OTHER): Payer: BC Managed Care – PPO | Admitting: Dentistry

## 2021-11-19 ENCOUNTER — Encounter (HOSPITAL_COMMUNITY): Payer: Self-pay | Admitting: Dentistry

## 2021-11-19 VITALS — BP 136/65 | HR 67 | Temp 97.7°F

## 2021-11-19 DIAGNOSIS — K011 Impacted teeth: Secondary | ICD-10-CM

## 2021-11-19 DIAGNOSIS — K0850 Unsatisfactory restoration of tooth, unspecified: Secondary | ICD-10-CM | POA: Insufficient documentation

## 2021-11-19 DIAGNOSIS — M264 Malocclusion, unspecified: Secondary | ICD-10-CM

## 2021-11-19 DIAGNOSIS — K030 Excessive attrition of teeth: Secondary | ICD-10-CM

## 2021-11-19 DIAGNOSIS — K0530 Chronic periodontitis, unspecified: Secondary | ICD-10-CM

## 2021-11-19 DIAGNOSIS — K029 Dental caries, unspecified: Secondary | ICD-10-CM

## 2021-11-19 DIAGNOSIS — K08109 Complete loss of teeth, unspecified cause, unspecified class: Secondary | ICD-10-CM

## 2021-11-19 DIAGNOSIS — K036 Deposits [accretions] on teeth: Secondary | ICD-10-CM

## 2021-11-19 DIAGNOSIS — C01 Malignant neoplasm of base of tongue: Secondary | ICD-10-CM

## 2021-11-19 DIAGNOSIS — Z01818 Encounter for other preprocedural examination: Secondary | ICD-10-CM

## 2021-11-19 DIAGNOSIS — Z7901 Long term (current) use of anticoagulants: Secondary | ICD-10-CM

## 2021-11-19 DIAGNOSIS — K0889 Other specified disorders of teeth and supporting structures: Secondary | ICD-10-CM

## 2021-11-19 NOTE — Patient Instructions (Addendum)
[facility] DEPARTMENT OF DENTAL MEDICINE Dr. Isidoro Santillana B. Anokhi Shannon, D.M.D. Phone: [phone] Fax: [phone]        It was a pleasure seeing you today!  Please refer to the information below regarding your dental visit with us.  Please do not hesitate to give us a call if any questions or concerns come up after you leave.    Thank you for letting us provide care for you.  If there is anything we can do for you, please let us know.      RADIATION THERAPY AND INFORMATION REGARDING YOUR TEETH  XEROSTOMIA (dry mouth):  Your salivary glands may be in the field of radiation.  Radiation may include all or only part of your salivary glands.  This will cause your saliva to dry up, and you will have a dry mouth.  The dry mouth will be for the rest of your life unless your radiation oncologist tells you otherwise.   Your saliva has many functions: It wets your tongue for speaking. It coats your teeth and the inside of your mouth for easier movement. It helps with chewing and swallowing food. It helps clean away harmful acid and toxic products made by the germs in your mouth, therefore it helps prevent cavities. It kills some germs in your mouth and helps to prevent gum disease. It helps to carry flavor to your taste buds.  Once you have lost your saliva, you will be at higher risk for tooth decay and gum disease.     What can be done to help improve your mouth when there's not enough saliva? Your dentist may give a recommendation for CLoSYS.  It will not bring back all of your saliva but may bring back some of it.  Also, your saliva may be thick and ropy or white and foamy.  It will not feel like it use to feel. You will need to swish with water every time your mouth feels dry.  YOU CANNOT suck on any cough drops, mints, lemon drops, candy, vitamin C or any other products.  You cannot use anything other than water to make your mouth feel less dry.  If you want to  drink anything else, you have to drink it all at once and brush afterwards.  Be sure to discuss the details of your diet habits with your dentist or hygienist.   RADIATION CARIES:  This is decay (cavities) that happens very quickly  once your mouth is very dry due to radiation therapy.  Normally, cavities take six months to two years to become a problem.  When you have dry mouth, cavities may take as little as eight weeks to cause you a problem.    Dental check-ups every 2-4 months are necessary as long as you have a dry mouth.  Radiation caries typically, but not always, starts at your gum line where it is hard to see the cavity.  It is therefore also hard to fill these cavities adequately.  This high rate of cavities happens because your mouth no longer has saliva and therefore the acid made by the germs starts the decay process.  Whenever you eat anything the germs in your mouth change the food into acid.  The acid then burns a small hole in your tooth.  This small hole is the beginning of a cavity.  If this is not treated then it will grow bigger and become a cavity.  The way to avoid this hole getting bigger   is to use fluoride every evening as prescribed by your dentist following your radiation. NOTE:  You have to make sure that your teeth are very clean before you use the fluoride.  This fluoride in turn will strengthen your teeth and prepare them for another day of fighting acid. If you develop radiation caries many times, the damage is so large that you will have to have all your teeth removed.  This could be a big problem if some of these teeth are in the field of radiation.  Further details of why this could be a big problem will follow (see Osteoradionecrosis below).   DYSGEUSIA (loss of taste):  This happens to varying degrees once you've had radiation therapy to your jaw region.  Many times taste is not completely lost, but becomes limited.  The loss of taste is mostly due to radiation  affecting your taste buds.  However, if you have no saliva in your mouth to carry the flavor to your taste buds, it would be difficult for your taste buds to taste anything.  That is why using water or over-the-counter brand CLoSYS prior to meals and during meal times may help with some of the taste.  Keep in mind that taste generally returns very slowly over the course of several months or several years after radiation therapy.  Don't give up hope.   TRISMUS (limited jaw opening):  According to your radiation oncologist, your TMJ or jaw joints are going to be partially or fully in the field of radiation.  This means that over time the muscles that help you open and close your mouth may get stiff.  This will potentially result in your not being able to open your mouth wide enough or as wide as you can open it now.  Let me give you an example of how slowly this happens and how unaware people are of it: A gentlemen that had radiation therapy 2 years ago went back to his dentist complaining that "bananas were just too large for him to be able to fit them in between his teeth."  He was no longer able to open wide enough to bite into a banana.  This happened slowly and over a period of time.   What we do to try and prevent this:   Your dentist will probably give you a stack of sticks called a trismus exercise device.  This stack will help remind your muscles and your jaw joints to open up to the same distance every day.  Use these sticks every morning when you wake up, or according to the instructions given by your dentist.    You must use these sticks for at least one to two years after radiation therapy.  The reason for that is because it happens so slowly and keeps going on for about two years after radiation therapy.  Your hospital dentist will help you monitor your mouth opening and make sure that it's not getting smaller after radiation.      TRISMUS EXERCISES: Using the stack of sticks given to you by  your dentist, place the stack in your mouth and hold onto the other end for support. Leave the sticks in your mouth while holding the other end.  Allow 30 seconds for muscle stretching. Rest for a few seconds. Repeat 3-5 times. This exercise is recommended in the mornings and evenings unless otherwise instructed. The exercise should be done for a period of 2 YEARS after the end of radiation. Your maximum   jaw opening should be checked regularly at recall dental visits by your general dentist. You should report any changes, soreness, or difficulties encountered when doing the exercises to your dentist.   OSTEORADIONECROSIS (ORN):  This is a condition where your jaw bone after radiation therapy becomes very dry.  It has very little blood supply to keep it alive.  If you develop a cavity that turns into an abscess or an infection, then the jaw bone does not have enough blood supply to help fight the infection.  At this point it is very likely that the infection could cause the death of your jaw bone.  When you have dead bone it has to be removed.  Therefore, you might end up having to have surgery to remove part of your jaw bone, the part of the jaw bone that has been affected.     Healing is also a problem if you are to have surgery (like a tooth extraction) in the areas where the bone has had radiation therapy.  If you have surgery, you need more blood supply to heal which is not available.  When blood supply and oxygen are not available, there is a chance for the bone to die. Occasionally, ORN happens on its own with no obvious reason, but this is quite rare.  We believe that patients who continue to smoke and/or drink alcohol have a higher chance of having this problem. Once your jaw bone has had radiation therapy, if there are any remaining teeth in that area, it is not recommended to have them pulled unless your dentist or oral surgeon is aware of your history of radiation and believes it is safe.   The risks for ORN either from infection or spontaneously occurring (with no reason) are life long.      Questions?  Call our office during office hours at [phone].  

## 2021-11-19 NOTE — Progress Notes (Signed)
Department of Dental Medicine   Service Date:   11/19/2021  Patient Name:  Trevor Steele Date of Birth:   [birth year] Medical Record Number: [id number]  Referring Provider:           [name], MD  OUTPATIENT CONSULTATION PLAN/RECOMMENDATIONS   ASSESSMENT: There are no current signs of acute odontogenic infection including abscess, edema or erythema, or suspicious lesion requiring biopsy.   Caries/incipient lesions, defective restorations; periodontal concerns that include accretions on teeth, mobility & bone loss.  RECOMMENDATIONS: No dental intervention indicated prior to starting radiation at this time.  PLAN: Discuss case with medical team and coordinate treatment as needed. Follow-up after completion of radiation therapy (scheduled today). Call if any questions or concerns arise before next visit.   Discussed in detail all treatment options and recommendations with the patient and they are agreeable to the plan.    Thank you for consulting with Hospital Dentistry and for the opportunity to participate in this patient's treatment.  Should you have any questions or concerns, please contact the [facility] at [phone].   11/19/2021  HISTORY OF PRESENT ILLNESS: Trevor Steele is a very pleasant 61 y.o. 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  He did say today that he does not want chemotherapy because of his heart condition and risk potentially worsening it.  The patient presents today for a medically necessary dental consultation as part of their pre-radiation work-up.   DENTAL HISTORY: The patient reports he does have a dentist that he sees regularly, Dr. Ahmed Prima, who is located in  [place], [place].  His last visit was on 05/12/21 for a cleaning and exam. He reports that he does go regularly every 6 months.  He currently denies any dental/orofacial pain or sensitivity.  Patient is able to manage oral secretions.  Patient denies dysphagia, odynophagia, dysphonia, SOB and neck pain.  Patient denies fever, rigors and malaise.   CHIEF COMPLAINT:  Here for a pre-head and neck radiation dental evaluation.   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   Aortic atherosclerosis (HCC)    Aortic stenosis    COVID 06/28/2021   no symptoms, went to hospital with a-fib and diagnosed then   Diabetes mellitus without complication ([place])    Dysrhythmia    A-fib   GERD (gastroesophageal reflux disease)    High cholesterol    History of kidney stones    Hypertension    Mild dilation of ascending aorta ([place])    by echo 07/2021 though not corroborated on CT  Mitral regurgitation    Neck mass    Abnormal neck CT 07/2021 concerning for nodal metastasis   PAF (paroxysmal atrial fibrillation) (HCC)    Pneumonia    Pulmonary nodules    PVC's (premature ventricular contractions)    Past Surgical History:  Procedure Laterality Date   BACK SURGERY     BACK SURGERY     lumbar    LARYNGOSCOPY AND ESOPHAGOSCOPY N/A 10/30/2021   Procedure: DIRECT LARYNGOSCOPY WITH BIOPSY; ESOPHAGOSCOPY;  Surgeon: [name], MD;  Location: [facility];  Service: ENT;  Laterality: N/A;   LEFT HEART CATH AND CORONARY ANGIOGRAPHY N/A 10/09/2021   Procedure: LEFT HEART CATH AND CORONARY ANGIOGRAPHY;  Surgeon: Burnell Blanks, MD;  Location: [facility];  Service: Cardiovascular;  Laterality: N/A;   TONSILLECTOMY Bilateral 10/30/2021   Procedure: TONSILLECTOMY; FROZEN SECTION;  Surgeon: [name], MD;  Location: [facility];  Service: ENT;  Laterality: Bilateral;   No Known Allergies Current Outpatient Medications  Medication Sig Dispense Refill   acetaminophen (TYLENOL) 500 MG tablet Take 500 mg by mouth every 6 (six) hours as needed for mild pain.     apixaban (ELIQUIS) 5 MG TABS tablet Take 5 mg by mouth 2 (two) times daily.     atorvastatin (LIPITOR) 40 MG tablet Take 1 tablet (40 mg total) by mouth daily. 90 tablet 3   diltiazem (CARDIZEM) 30 MG tablet Take 1 tablet (30 mg total) by mouth every 6 (six) hours as needed (palpitations). 30 tablet 1   empagliflozin (JARDIANCE) 10 MG TABS tablet Take 1 tablet (10 mg total) by mouth daily before breakfast. 90 tablet 3   esomeprazole (NEXIUM) 40 MG capsule Take 40 mg by mouth daily.  12   flecainide (TAMBOCOR) 50 MG tablet Take 1 tablet (50 mg total) by mouth 2 (two) times daily. 60 tablet 11   glimepiride (AMARYL) 1 MG tablet Take 1 mg by mouth every evening.     HYDROcodone-acetaminophen (NORCO/VICODIN) 5-325 MG tablet Take 1 tablet by mouth every 6 (six) hours as needed for moderate pain. 28 tablet 0   lisinopril (ZESTRIL) 10 MG tablet Take 1 tablet (10 mg total) by mouth daily. 30 tablet 1   metFORMIN (GLUCOPHAGE) 1000 MG tablet Take 1,000 mg by mouth 2 (two) times daily.  12   metoprolol succinate (TOPROL-XL) 50 MG 24 hr tablet Take 1 tablet (50 mg total) by mouth in the morning and at bedtime. Take with or immediately following a meal.  60 tablet 0   Multiple Vitamin (MULTIVITAMIN WITH MINERALS) TABS tablet Take 1 tablet by mouth daily.     Current Facility-Administered Medications  Medication Dose Route Frequency Provider Last Rate Last Admin   sodium chloride flush (NS) 0.9 % injection 3 mL  3 mL Intravenous Q12H Imogene Burn, [place]        LABS: Lab Results  Component Value Date   WBC 10.0 10/08/2021   HGB 14.7 10/08/2021   HCT 43.7 10/08/2021   MCV 92 10/08/2021   PLT 234 10/08/2021      Component Value Date/Time   NA 139 10/08/2021 0813   K 4.7 10/08/2021 0813   CL 103 10/08/2021 0813   CO2 21 10/08/2021 0813   GLUCOSE 140 (H) 10/08/2021 0813   GLUCOSE 111 (H) 08/12/2021 1851   BUN 21 10/08/2021 0813   CREATININE 0.96 10/08/2021 0813   CALCIUM 9.3 10/08/2021 0813   GFRNONAA >60 08/12/2021 1851   GFRAA >60 10/24/2015 0848   No  results found for: "INR", "PROTIME" No results found for: "PTT"  Social History   Socioeconomic History   Marital status: Married    Spouse name: Not on file   Number of children: Not on file   Years of education: Not on file   Highest education level: Not on file  Occupational History   Not on file  Tobacco Use   Smoking status: Every Day    Packs/day: 0.50    Types: Cigarettes    Last attempt to quit: 07/09/2021    Years since quitting: 0.3   Smokeless tobacco: Not on file  Vaping Use   Vaping Use: Former  Substance and Sexual Activity   Alcohol use: Not Currently   Drug use: No   Sexual activity: Yes    Birth control/protection: None  Other Topics Concern   Not on file  Social History Narrative   Not on file   Social Determinants of Health   Financial Resource Strain: Not on file  Food Insecurity: Not on file  Transportation Needs: Not on file  Physical Activity: Not on file  Stress: Not on file  Social Connections: Not on file  Intimate Partner Violence: Not on file   Family History  Problem Relation Age of Onset   Coronary artery disease  Mother    Coronary artery disease Father    Mesothelioma Father    CAD Neg Hx        negative for premature CAD     REVIEW OF SYSTEMS:  Reviewed with the patient as per HPI. PSYCH:  Patient denies having dental phobia.   VITAL SIGNS: BP 136/65 (BP Location: Right Arm, Patient Position: Sitting, Cuff Size: Normal)   Pulse 67   Temp 97.7 F (36.5 C) (Oral)    PHYSICAL EXAM: GENERAL:  Well-developed, comfortable and in no apparent distress. NEUROLOGICAL:  Alert and oriented to person, place and  time. EXTRAORAL:  Facial symmetry present without any edema or erythema.  No swelling or lymphadenopathy.  TMJ asymptomatic without clicks or crepitations.     Maximum Interincisal Opening:  50 mm INTRAORAL:  Soft tissues appear well-perfused and mucous membranes moist.  FOM and vestibules soft and not raised. Oral cavity without mass or lesion. No signs of infection, parulis, sinus tract, edema or erythema evident upon exam.   DENTAL EXAM: Hard tissue exam completed and charted.    OVERALL IMPRESSION:  Fair remaining dentition.       ORAL HYGIENE:  Poor     PERIODONTAL:  Pink, healthy gingival tissue with blunted papilla with areas of inflamed, erythematous tissue.   Generalized plaque and calculus accumulation. [+] Mobility:  Class I- #20 [+] Recession:  Generalized CARIES:  #66M, #3D, #67M, #54M, #14O, #15O [+] Incipient:  #8D, #20D DEFECTIVE RESTORATIONS:   #3 existing amalgam with recurrent decay; #8 and #9 large existing resin restorations with poor marginal integrity and recurrent decay PROSTHODONTICS:  Patient denies history of wearing partial dentures. OCCLUSION:  Class 3 malocclusion Non-functional teeth:  #2, #3, #14, #15, #16 OTHER FINDINGS:   [+] Attrition/wear:  #23, #24, #25 and #26 incisal [+] Impacted teeth:  #16 [+] #22 rotated; #21 drifting mesial; #29 small chip/defect limited to enamel on distal; congenitally missing lateral maxillary incisors     RADIOGRAPHIC EXAM:  PAN and Full Mouth Series were exposed and interpreted.  Condyles seated bilaterally in fossas.  No evidence of abnormal pathology.  All visualized osseous structures appear WNL.  Missing teeth #'s 1, 4, 5, 7,  10, 17, 18, 19, 30, 31 & 32.  #2, #3, #14 & #15 appear supra-erupted.  #16 is impacted.  #3 & #21 drifting towards mesial.  Generalized mild-moderate horizontal bone loss consistent w/ mild to moderate periodontitis.  #22 vertical bone loss on distal.  Radiographic calculus evident on lower anterior teeth. Existing restorations evident on #3, #8 & #9.  Caries- #86M, #3D, #60M & #61M.  Incipient lesions- #8D & #20D.   ASSESSMENT:  1.  Base of tongue cancer 2.  Pre-head and neck radiation dental exam 3.  Long-term use of anticoagulation (current use) 4.  Missing teeth 5.  Caries 6.  Incipient lesions 7.  Accretions on teeth 8.  Postoperative bleeding risk 9.  Chronic periodontitis 10.  Impacted 3rd molar 11.  Loose teeth 12.  Malocclusion 13.  Attrition/wear 14.  Defective restorations   PROCEDURES: The common and significant side effects of radiation therapy to the head and neck were explained and discussed with the patient.  The discussion included side effects of trismus (limited opening), dysgeusia (loss of taste), xerostomia (dry mouth), radiation caries and osteoradionecrosis of the jaw.  I also discussed the importance of maintaining optimal oral hygiene and oral health before, during and after radiation to decrease the risk of developing radiation cavities and the need for any surgery such as extractions after therapy.    Trismus appliance made using patient's baseline MIO which = 27 sticks.  Leta Speller, DAII demonstrated use of appliance.  Verbal and written postop instructions were given to the patient.   PLAN AND RECOMMENDATIONS: I discussed the risks, benefits, and complications of various scenarios with the patient in relationship to their  medical and dental conditions, which included systemic infection or other serious issues such as osteoradionecrosis that could potentially occur either before, during or after their anticipated radiation therapy if dental/oral concerns are not addressed.  I explained that if any chronic or acute dental/oral infection(s) are addressed and subsequently not maintained following medical optimization and recovery, their risk of the previously mentioned complications are just as high and could potentially occur postoperatively.  I explained all significant findings of the dental consultation with the patient including cavities on teeth (including incipient lesions), inflammation of his gums due to tartar/plaque build-up and his history of smoking which has led to bone loss and loosening of teeth over time.  Discussed that although he does have dental issues needing attention, treatment is not indicated before starting radiation because of where he is receiving it (as well as receiving <50 Gy to teeth roots per Dr. Pearlie Oyster estimates). Recommend he continue to see his primary dentist following radiation to prevent complications as well as to fix any teeth needing fillings before they get worse. Explained to patient that as long as he smokes, his gum disease will not resolve and can only be maintained temporarily.  The patient verbalized understanding of all findings, discussion, and recommendations. We then discussed various treatment options to include no treatment, multiple extractions with alveoloplasty, pre-prosthetic surgery as indicated, periodontal therapy, dental restorations, root canal therapy, crown and bridge therapy, implant therapy, and replacement of missing teeth as indicated.  I discussed returning to our hospital dental clinic once more after he completes radiation for a follow-up visit before returning to his regular dentist.  The patient is agreeable to this plan.  Plan to discuss all findings and  recommendations with medical team and coordinate future care as needed.   Follow-up after the completion of radiation therapy.  The  patient will need to return to their regular dentist for routine dental care including replacement of missing teeth as needed, cleanings and exams. Call if any questions or concerns arise before next visit.   All questions and concerns were invited and addressed.  The patient tolerated today's visit well and departed in stable condition. I spent in excess of 120 minutes during the conduct of this consultation and >50% of this time involved direct face-to-face encounter for counseling and/or coordination of the patient's care.   Seymour Benson [place], DMD

## 2021-11-25 ENCOUNTER — Other Ambulatory Visit: Payer: Self-pay

## 2021-11-25 DIAGNOSIS — F17210 Nicotine dependence, cigarettes, uncomplicated: Secondary | ICD-10-CM | POA: Diagnosis not present

## 2021-11-25 DIAGNOSIS — C01 Malignant neoplasm of base of tongue: Secondary | ICD-10-CM

## 2021-11-27 ENCOUNTER — Ambulatory Visit
Admission: RE | Admit: 2021-11-27 | Payer: BC Managed Care – PPO | Source: Ambulatory Visit | Admitting: Radiation Oncology

## 2021-11-27 ENCOUNTER — Ambulatory Visit
Admission: RE | Admit: 2021-11-27 | Discharge: 2021-11-27 | Disposition: A | Discharge status: Home / Self Care | Payer: BC Managed Care – PPO | Source: Ambulatory Visit | Attending: Radiation Oncology | Admitting: Radiation Oncology

## 2021-11-27 VITALS — BP 127/88 | HR 68 | Temp 96.8°F | Resp 18 | Ht 71.5 in | Wt 193.8 lb

## 2021-11-27 DIAGNOSIS — C01 Malignant neoplasm of base of tongue: Secondary | ICD-10-CM | POA: Insufficient documentation

## 2021-11-27 DIAGNOSIS — F17210 Nicotine dependence, cigarettes, uncomplicated: Secondary | ICD-10-CM | POA: Diagnosis not present

## 2021-11-27 LAB — BUN & CREATININE (CHCC)
BUN: 22 mg/dL (ref 8–23)
Creatinine: 0.91 mg/dL (ref 0.61–1.24)
GFR, Estimated: >60 mL/min (ref >60)

## 2021-11-27 LAB — TSH: TSH: 0.759 u[IU]/mL (ref 0.350–4.500)

## 2021-11-27 MED ORDER — SODIUM CHLORIDE 0.9% FLUSH
10.0000 mL | Freq: Once | INTRAVENOUS | Status: AC
  Administered 2021-11-27: 10 mL via INTRAVENOUS

## 2021-11-27 NOTE — Progress Notes (Signed)
Oncology Nurse Navigator Documentation   To provide support, encouragement and care continuity, met with Trevor Steele and his wife before CT SIM.  He tolerated procedure without difficulty, denied questions/concerns.   I encouraged him to call me prior to 12/05/21 [place].   Harlow Asa RN, BSN, OCN Head & Neck Oncology Nurse [place] [facility] Phone # [phone]  Fax # [phone]

## 2021-11-27 NOTE — Progress Notes (Signed)
Has armband been applied?  Yes.    Does patient have an allergy to IV contrast dye?: No.   Has patient ever received premedication for IV contrast dye?: No.   Date of lab work: November 27, 2021 BUN: 21 CR: 0.91 eGFR: >60  Does patient take metformin?: Yes.    Is eGFR >60?: Yes.   If no, when can patient resume? (Must be 48 hrs AFTER they receive IV contrast):  N/A  IV site: antecubital left, condition patent and no redness  Has IV site been added to flowsheet?  Yes.    BP 127/88 (BP Location: Left Arm, Patient Position: Sitting, Cuff Size: Normal)   Pulse 68   Temp (!) 96.8 F (36 C)   Resp 18   Ht 5' 11.5" (1.816 m)   Wt 193 lb 12.8 oz (87.9 kg)   SpO2 100%   BMI 26.65 kg/m

## 2021-11-29 ENCOUNTER — Other Ambulatory Visit: Payer: Self-pay

## 2021-11-29 DIAGNOSIS — C01 Malignant neoplasm of base of tongue: Secondary | ICD-10-CM

## 2021-12-02 ENCOUNTER — Inpatient Hospital Stay: Payer: BC Managed Care – PPO | Admitting: Licensed Clinical Social Worker

## 2021-12-02 NOTE — Progress Notes (Signed)
Trevor Steele  Clinical Social Steele was referred by Art therapist for assessment of psychosocial needs.  Clinical Social Worker contacted patient by phone  to offer support and assess for needs.  CSW informed patient of the support team and support services at [facility].  Patient states he is doing well at this time but will contact CSW if any needs arise after treatment begins.  CSW provided contact information and encouraged patient to call with any questions or concerns.      [place], [place] Worker [employer]

## 2021-12-04 DIAGNOSIS — C01 Malignant neoplasm of base of tongue: Secondary | ICD-10-CM | POA: Diagnosis not present

## 2021-12-04 DIAGNOSIS — F17210 Nicotine dependence, cigarettes, uncomplicated: Secondary | ICD-10-CM | POA: Diagnosis not present

## 2021-12-05 ENCOUNTER — Ambulatory Visit
Admission: RE | Admit: 2021-12-05 | Discharge: 2021-12-05 | Disposition: A | Discharge status: Home / Self Care | Payer: BC Managed Care – PPO | Source: Ambulatory Visit | Attending: Radiation Oncology | Admitting: Radiation Oncology

## 2021-12-05 ENCOUNTER — Other Ambulatory Visit: Payer: Self-pay

## 2021-12-05 DIAGNOSIS — F17210 Nicotine dependence, cigarettes, uncomplicated: Secondary | ICD-10-CM | POA: Diagnosis not present

## 2021-12-05 DIAGNOSIS — C01 Malignant neoplasm of base of tongue: Secondary | ICD-10-CM | POA: Diagnosis not present

## 2021-12-05 DIAGNOSIS — Z510 Encounter for antineoplastic radiation therapy: Secondary | ICD-10-CM | POA: Diagnosis not present

## 2021-12-05 LAB — RAD ONC ARIA SESSION SUMMARY
Course Elapsed Days: 0
Plan Fractions Treated to Date: 1
Plan Prescribed Dose Per Fraction: 2 Gy
Plan Total Fractions Prescribed: 35
Plan Total Prescribed Dose: 70 Gy
Reference Point Dosage Given to Date: 2 Gy
Reference Point Session Dosage Given: 2 Gy
Session Number: 1

## 2021-12-06 ENCOUNTER — Other Ambulatory Visit: Payer: Self-pay

## 2021-12-06 ENCOUNTER — Ambulatory Visit
Admission: RE | Admit: 2021-12-06 | Discharge: 2021-12-06 | Disposition: A | Discharge status: Home / Self Care | Payer: BC Managed Care – PPO | Source: Ambulatory Visit | Attending: Radiation Oncology | Admitting: Radiation Oncology

## 2021-12-06 ENCOUNTER — Other Ambulatory Visit: Payer: Self-pay | Admitting: Radiation Oncology

## 2021-12-06 DIAGNOSIS — C01 Malignant neoplasm of base of tongue: Secondary | ICD-10-CM | POA: Diagnosis not present

## 2021-12-06 DIAGNOSIS — Z510 Encounter for antineoplastic radiation therapy: Secondary | ICD-10-CM | POA: Diagnosis not present

## 2021-12-06 DIAGNOSIS — F17210 Nicotine dependence, cigarettes, uncomplicated: Secondary | ICD-10-CM | POA: Diagnosis not present

## 2021-12-06 LAB — RAD ONC ARIA SESSION SUMMARY
Course Elapsed Days: 1
Plan Fractions Treated to Date: 2
Plan Prescribed Dose Per Fraction: 2 Gy
Plan Total Fractions Prescribed: 35
Plan Total Prescribed Dose: 70 Gy
Reference Point Dosage Given to Date: 4 Gy
Reference Point Session Dosage Given: 2 Gy
Session Number: 2

## 2021-12-06 MED ORDER — LIDOCAINE VISCOUS HCL 2 % MT SOLN: OROMUCOSAL | 5 refills | Status: DC

## 2021-12-06 MED ORDER — SONAFINE EX EMUL
1.0000 | Freq: Two times a day (BID) | CUTANEOUS | Status: DC
  Administered 2021-12-06: 1 via TOPICAL

## 2021-12-06 NOTE — Progress Notes (Signed)
Pt here for patient teaching.    Pt given Radiation and You booklet, Managing Acute Radiation Side Effects for Head and Neck Cancer handout, skin care instructions, and Sonafine.    Reviewed areas of pertinence such as fatigue, hair loss in treatment field, mouth changes, skin changes, throat changes, earaches, and taste changes .   Pt able to give teach back of to pat skin, use unscented/gentle soap, and drink plenty of water,apply Sonafine bid, avoid applying anything to skin within 4 hours of treatment, and to use an electric razor if they must shave.   Pt verbalizes understanding of information given and will contact nursing with any questions or concerns.    [email or web address]

## 2021-12-09 ENCOUNTER — Other Ambulatory Visit: Payer: Self-pay

## 2021-12-09 ENCOUNTER — Ambulatory Visit
Admission: RE | Admit: 2021-12-09 | Discharge: 2021-12-09 | Disposition: A | Discharge status: Home / Self Care | Payer: BC Managed Care – PPO | Source: Ambulatory Visit | Attending: Radiation Oncology | Admitting: Radiation Oncology

## 2021-12-09 ENCOUNTER — Encounter: Payer: BC Managed Care – PPO | Admitting: Nutrition

## 2021-12-09 DIAGNOSIS — Z510 Encounter for antineoplastic radiation therapy: Secondary | ICD-10-CM | POA: Diagnosis not present

## 2021-12-09 DIAGNOSIS — C01 Malignant neoplasm of base of tongue: Secondary | ICD-10-CM | POA: Diagnosis not present

## 2021-12-09 DIAGNOSIS — F17210 Nicotine dependence, cigarettes, uncomplicated: Secondary | ICD-10-CM | POA: Diagnosis not present

## 2021-12-09 LAB — RAD ONC ARIA SESSION SUMMARY
Course Elapsed Days: 4
Plan Fractions Treated to Date: 3
Plan Prescribed Dose Per Fraction: 2 Gy
Plan Total Fractions Prescribed: 35
Plan Total Prescribed Dose: 70 Gy
Reference Point Dosage Given to Date: 6 Gy
Reference Point Session Dosage Given: 2 Gy
Session Number: 3

## 2021-12-10 ENCOUNTER — Ambulatory Visit
Admission: RE | Admit: 2021-12-10 | Discharge: 2021-12-10 | Disposition: A | Discharge status: Home / Self Care | Payer: BC Managed Care – PPO | Source: Ambulatory Visit | Attending: Radiation Oncology | Admitting: Radiation Oncology

## 2021-12-10 ENCOUNTER — Inpatient Hospital Stay: Payer: BC Managed Care – PPO | Attending: Hematology and Oncology | Admitting: Dietician

## 2021-12-10 ENCOUNTER — Other Ambulatory Visit: Payer: Self-pay

## 2021-12-10 DIAGNOSIS — Z510 Encounter for antineoplastic radiation therapy: Secondary | ICD-10-CM | POA: Diagnosis not present

## 2021-12-10 DIAGNOSIS — C01 Malignant neoplasm of base of tongue: Secondary | ICD-10-CM | POA: Diagnosis not present

## 2021-12-10 DIAGNOSIS — F17210 Nicotine dependence, cigarettes, uncomplicated: Secondary | ICD-10-CM | POA: Diagnosis not present

## 2021-12-10 LAB — RAD ONC ARIA SESSION SUMMARY
Course Elapsed Days: 5
Plan Fractions Treated to Date: 4
Plan Prescribed Dose Per Fraction: 2 Gy
Plan Total Fractions Prescribed: 35
Plan Total Prescribed Dose: 70 Gy
Reference Point Dosage Given to Date: 8 Gy
Reference Point Session Dosage Given: 2 Gy
Session Number: 4

## 2021-12-10 NOTE — Progress Notes (Signed)
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 He is receiving radiation therapy (started 8/3). Patient is under the care of Dr. Isidore Moos.  Past medical history includes DM2, HLD, GERD, CAD, HTN, A-fib with RVR on anticoagulation.   Met with patient and wife in office. Patient reports mild throat discomfort. He has dry mouth at baseline. Patient does not have a big appetite normally. Typically he skips breakfast meal during the week, eats lunch at work (chicken, fries, baked beans), meat, starch and vegetable for dinner. He snacks on candy bars (snickers, peanut butter bars, butterfingers). Patient likes milk, drinks 2 (12 oz) glasses daily. He is "not drinking enough water" recalls 16 oz daily. He also drinks one diet soda and 2 cups of coffee. Patient is active, reports walking 3 miles most every day. He denies nausea, vomiting, diarrhea, constipation.   Nutrition Focused Physical Exam: deferred    Medications: eliquis, lipitor, cardizem, jardiance, nexium, xylocaine, toprol, MVI   Labs: 6/06 labs reviewed  Last HgbA1c 6.8 on 08/02/20  Anthropometrics:   Height: 5'11.5" Weight: 193 lb 12.8 oz  UBW: 193-195 lb per pt BMI: 26.65   NUTRITION DIAGNOSIS: Food and nutrition related knowledge deficit related to cancer as evidenced by no prior need for related nutrition information    INTERVENTION:  Educated on importance of adequate calorie and protein energy intake to maintain weights/strength Encouraged daily breakfast along with snacks in between meals and at bedtime - handout with ideas provided Discussed soft moist high protein foods and ways to add calories - handouts + shake recipes provided Suggested daily oral nutrition supplement - samples of Ensure Plus, Ensure Complete and coupons provided. Will provide Ensure case if pt likes these Educated on importance hydration, pt will work to increase intake of water, recommend  8-10 glasses Recommend baking soda salt water rinses several times daily - handout with recipe provided  Encouraged activity as able    MONITORING, EVALUATION, GOAL: Patient will tolerate increased calories and protein to minimize weight loss during treatment    Next Visit: Monday August 14 with Pamala Hurry (pt aware)

## 2021-12-11 ENCOUNTER — Ambulatory Visit
Admission: RE | Admit: 2021-12-11 | Discharge: 2021-12-11 | Disposition: A | Discharge status: Home / Self Care | Payer: BC Managed Care – PPO | Source: Ambulatory Visit | Attending: Radiation Oncology | Admitting: Radiation Oncology

## 2021-12-11 ENCOUNTER — Other Ambulatory Visit: Payer: Self-pay

## 2021-12-11 DIAGNOSIS — C01 Malignant neoplasm of base of tongue: Secondary | ICD-10-CM | POA: Diagnosis not present

## 2021-12-11 DIAGNOSIS — F17210 Nicotine dependence, cigarettes, uncomplicated: Secondary | ICD-10-CM | POA: Diagnosis not present

## 2021-12-11 DIAGNOSIS — Z510 Encounter for antineoplastic radiation therapy: Secondary | ICD-10-CM | POA: Diagnosis not present

## 2021-12-11 LAB — RAD ONC ARIA SESSION SUMMARY
Course Elapsed Days: 6
Plan Fractions Treated to Date: 5
Plan Prescribed Dose Per Fraction: 2 Gy
Plan Total Fractions Prescribed: 35
Plan Total Prescribed Dose: 70 Gy
Reference Point Dosage Given to Date: 10 Gy
Reference Point Session Dosage Given: 2 Gy
Session Number: 5

## 2021-12-12 ENCOUNTER — Other Ambulatory Visit: Payer: Self-pay

## 2021-12-12 ENCOUNTER — Ambulatory Visit
Admission: RE | Admit: 2021-12-12 | Discharge: 2021-12-12 | Disposition: A | Discharge status: Home / Self Care | Payer: BC Managed Care – PPO | Source: Ambulatory Visit | Attending: Radiation Oncology | Admitting: Radiation Oncology

## 2021-12-12 DIAGNOSIS — Z510 Encounter for antineoplastic radiation therapy: Secondary | ICD-10-CM | POA: Diagnosis not present

## 2021-12-12 DIAGNOSIS — C01 Malignant neoplasm of base of tongue: Secondary | ICD-10-CM | POA: Diagnosis not present

## 2021-12-12 DIAGNOSIS — F17210 Nicotine dependence, cigarettes, uncomplicated: Secondary | ICD-10-CM | POA: Diagnosis not present

## 2021-12-12 LAB — RAD ONC ARIA SESSION SUMMARY
Course Elapsed Days: 7
Plan Fractions Treated to Date: 6
Plan Prescribed Dose Per Fraction: 2 Gy
Plan Total Fractions Prescribed: 35
Plan Total Prescribed Dose: 70 Gy
Reference Point Dosage Given to Date: 12 Gy
Reference Point Session Dosage Given: 2 Gy
Session Number: 6

## 2021-12-13 ENCOUNTER — Ambulatory Visit
Admission: RE | Admit: 2021-12-13 | Discharge: 2021-12-13 | Disposition: A | Discharge status: Home / Self Care | Payer: BC Managed Care – PPO | Source: Ambulatory Visit | Attending: Radiation Oncology | Admitting: Radiation Oncology

## 2021-12-13 ENCOUNTER — Other Ambulatory Visit: Payer: Self-pay

## 2021-12-13 DIAGNOSIS — Z510 Encounter for antineoplastic radiation therapy: Secondary | ICD-10-CM | POA: Diagnosis not present

## 2021-12-13 DIAGNOSIS — F17210 Nicotine dependence, cigarettes, uncomplicated: Secondary | ICD-10-CM | POA: Diagnosis not present

## 2021-12-13 DIAGNOSIS — C01 Malignant neoplasm of base of tongue: Secondary | ICD-10-CM | POA: Diagnosis not present

## 2021-12-13 LAB — RAD ONC ARIA SESSION SUMMARY
Course Elapsed Days: 8
Plan Fractions Treated to Date: 7
Plan Prescribed Dose Per Fraction: 2 Gy
Plan Total Fractions Prescribed: 35
Plan Total Prescribed Dose: 70 Gy
Reference Point Dosage Given to Date: 14 Gy
Reference Point Session Dosage Given: 2 Gy
Session Number: 7

## 2021-12-16 ENCOUNTER — Inpatient Hospital Stay: Payer: BC Managed Care – PPO | Admitting: Nutrition

## 2021-12-16 ENCOUNTER — Other Ambulatory Visit: Payer: Self-pay

## 2021-12-16 ENCOUNTER — Ambulatory Visit: Payer: BC Managed Care – PPO

## 2021-12-16 ENCOUNTER — Ambulatory Visit
Admission: RE | Admit: 2021-12-16 | Discharge: 2021-12-16 | Disposition: A | Discharge status: Home / Self Care | Payer: BC Managed Care – PPO | Source: Ambulatory Visit | Attending: Radiation Oncology | Admitting: Radiation Oncology

## 2021-12-16 ENCOUNTER — Other Ambulatory Visit: Payer: Self-pay | Admitting: Radiation Oncology

## 2021-12-16 DIAGNOSIS — Z510 Encounter for antineoplastic radiation therapy: Secondary | ICD-10-CM | POA: Diagnosis not present

## 2021-12-16 DIAGNOSIS — F17210 Nicotine dependence, cigarettes, uncomplicated: Secondary | ICD-10-CM | POA: Diagnosis not present

## 2021-12-16 DIAGNOSIS — C01 Malignant neoplasm of base of tongue: Secondary | ICD-10-CM | POA: Diagnosis not present

## 2021-12-16 LAB — RAD ONC ARIA SESSION SUMMARY
Course Elapsed Days: 11
Plan Fractions Treated to Date: 8
Plan Prescribed Dose Per Fraction: 2 Gy
Plan Total Fractions Prescribed: 35
Plan Total Prescribed Dose: 70 Gy
Reference Point Dosage Given to Date: 16 Gy
Reference Point Session Dosage Given: 2 Gy
Session Number: 8

## 2021-12-16 MED ORDER — HYDROCODONE-ACETAMINOPHEN 7.5-325 MG/15ML PO SOLN: 10.0000 mL | Freq: Four times a day (QID) | ORAL | 0 refills | Status: DC | PRN

## 2021-12-16 NOTE — Progress Notes (Signed)
Nutrition Follow-up:  Seen for scheduled RD as RD with multiple patients arrived at the same time for visit.   Patient with SCC of oropharynx.  Patient is receiving radiation therapy (started 8/3).  Followed by Dr Isidore Moos  Met with patient and wife following radiation treatment.  Reports increased sore throat and dryness this week.  Dr Isidore Moos prescribed liquid pain medication and lidocaine.  Appetite continues to be good.  Tried shakes and able to drink them.  Usually for breakfast will drink a shake or eat 3 eggs with cheese.  Lunch maybe chicken wings and fries or shrimp and salad, sometimes dessert.  Evening maybe peanut butter sandwich or deli [place] with gravy, corn and green beans.     Medications: reviewed  Labs: reviewed  Anthropometrics:   Weight 192 lb 6 oz today per patient in radiation  UBW 193-195 lb   NUTRITION DIAGNOSIS: Food and nutrition related knowledge deficit improved   INTERVENTION:  Reviewed importance of adding moisture to foods. Liberalize diet during treatment.  Did refer patient to www.aicr.org website to review and focus more after treatment.  Patient with questions on healthy eating.  Complimentary case of ensure plus given to patient today.   Encouraged hydration.      MONITORING, EVALUATION, GOAL: weight trends, intake   NEXT VISIT: Monday, August 21 ([place])  Trevor Steele B. Zenia Resides, [place], [place] Registered Dietitian [phone]

## 2021-12-17 ENCOUNTER — Other Ambulatory Visit: Payer: Self-pay

## 2021-12-17 ENCOUNTER — Ambulatory Visit
Admission: RE | Admit: 2021-12-17 | Discharge: 2021-12-17 | Disposition: A | Discharge status: Home / Self Care | Payer: BC Managed Care – PPO | Source: Ambulatory Visit | Attending: Radiation Oncology | Admitting: Radiation Oncology

## 2021-12-17 DIAGNOSIS — F17210 Nicotine dependence, cigarettes, uncomplicated: Secondary | ICD-10-CM | POA: Diagnosis not present

## 2021-12-17 DIAGNOSIS — Z510 Encounter for antineoplastic radiation therapy: Secondary | ICD-10-CM | POA: Diagnosis not present

## 2021-12-17 DIAGNOSIS — C01 Malignant neoplasm of base of tongue: Secondary | ICD-10-CM | POA: Diagnosis not present

## 2021-12-17 LAB — RAD ONC ARIA SESSION SUMMARY
Course Elapsed Days: 12
Plan Fractions Treated to Date: 9
Plan Prescribed Dose Per Fraction: 2 Gy
Plan Total Fractions Prescribed: 35
Plan Total Prescribed Dose: 70 Gy
Reference Point Dosage Given to Date: 18 Gy
Reference Point Session Dosage Given: 2 Gy
Session Number: 9

## 2021-12-18 ENCOUNTER — Ambulatory Visit
Admission: RE | Admit: 2021-12-18 | Discharge: 2021-12-18 | Disposition: A | Discharge status: Home / Self Care | Payer: BC Managed Care – PPO | Source: Ambulatory Visit | Attending: Radiation Oncology | Admitting: Radiation Oncology

## 2021-12-18 ENCOUNTER — Other Ambulatory Visit: Payer: Self-pay

## 2021-12-18 DIAGNOSIS — F17210 Nicotine dependence, cigarettes, uncomplicated: Secondary | ICD-10-CM | POA: Diagnosis not present

## 2021-12-18 DIAGNOSIS — Z510 Encounter for antineoplastic radiation therapy: Secondary | ICD-10-CM | POA: Diagnosis not present

## 2021-12-18 DIAGNOSIS — C01 Malignant neoplasm of base of tongue: Secondary | ICD-10-CM | POA: Diagnosis not present

## 2021-12-18 LAB — RAD ONC ARIA SESSION SUMMARY
Course Elapsed Days: 13
Plan Fractions Treated to Date: 10
Plan Prescribed Dose Per Fraction: 2 Gy
Plan Total Fractions Prescribed: 35
Plan Total Prescribed Dose: 70 Gy
Reference Point Dosage Given to Date: 20 Gy
Reference Point Session Dosage Given: 2 Gy
Session Number: 10

## 2021-12-18 NOTE — Therapy (Signed)
OUTPATIENT PHYSICAL THERAPY HEAD AND NECK BASELINE EVALUATION   Patient Name: Trevor Steele MRN: [id number] DOB:09/28/1960, 61 y.o., male Today's Date: 12/19/2021   PT End of Session - 12/19/21 0942     Visit Number 1    Number of Visits 2    Date for PT Re-Evaluation 02/13/22    PT Start Time 0919    PT Stop Time 0938    PT Time Calculation (min) 19 min    Activity Tolerance Patient tolerated treatment well    Behavior During Therapy [facility] for tasks assessed/performed             Past Medical History:  Diagnosis Date   [place] coronary artery calcium score greater than 400    coronary calcium score high at 1183 which is 55 percentile for age, race and sex matched controls.   Aortic atherosclerosis (HCC)    Aortic stenosis    COVID 06/28/2021   no symptoms, went to hospital with a-fib and diagnosed then   Diabetes mellitus without complication ([place])    Dysrhythmia    A-fib   GERD (gastroesophageal reflux disease)    High cholesterol    History of kidney stones    Hypertension    Mild dilation of ascending aorta ([place])    by echo 07/2021 though not corroborated on CT   Mitral regurgitation    Neck mass    Abnormal neck CT 07/2021 concerning for nodal metastasis   PAF (paroxysmal atrial fibrillation) (HCC)    Pneumonia    Pulmonary nodules    PVC's (premature ventricular contractions)    Past Surgical History:  Procedure Laterality Date   BACK SURGERY     BACK SURGERY     lumbar   LARYNGOSCOPY AND ESOPHAGOSCOPY N/A 10/30/2021   Procedure: DIRECT LARYNGOSCOPY WITH BIOPSY; ESOPHAGOSCOPY;  Surgeon: [name], MD;  Location: [facility];  Service: ENT;  Laterality: N/A;   LEFT HEART CATH AND CORONARY ANGIOGRAPHY N/A 10/09/2021   Procedure: LEFT HEART CATH AND CORONARY ANGIOGRAPHY;  Surgeon: Burnell Blanks, MD;  Location: [facility];  Service: Cardiovascular;  Laterality: N/A;   TONSILLECTOMY Bilateral 10/30/2021   Procedure: TONSILLECTOMY; FROZEN SECTION;   Surgeon: [name], MD;  Location: [facility];  Service: ENT;  Laterality: Bilateral;   Patient Active Problem List   Diagnosis Date Noted   Encounter for preoperative dental examination 11/19/2021   Teeth missing 11/19/2021   Caries 11/19/2021   Incipient enamel caries 11/19/2021   Accretions on teeth 11/19/2021   Chronic periodontitis 11/19/2021   Impacted third molar tooth 11/19/2021   Loose, teeth 11/19/2021   Malocclusion 11/19/2021   Dental attrition, excessive, limited to enamel 11/19/2021   Defective dental restoration 11/19/2021   Malignant neoplasm of base of tongue ([place]) 11/06/2021   Abnormal stress test    Atrial fibrillation with RVR ([place]) 08/02/2021   Type 2 diabetes mellitus ([place]) 08/02/2021   Essential hypertension 08/02/2021   Anticoagulated (apixaban) 08/02/2021   Hyperlipidemia associated with type 2 diabetes mellitus ([place]) 08/02/2021   High risk medication use (pioglitazone) 08/02/2021   Intermittent heart palpitations 08/02/2021   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 A soft tissue neck CT performed on 08/02/21 revealed enlarged, centrally necrotic left level 2A lymph nodes concerning for nodal metastasis without a definite primary. CT chest abdomen pelvis on  08/03/21 showed no evidence of primary malignancy in his chest abdomen or pelvis. 09/12/21 PET demonstrated asymmetric hypermetabolic activity in the deep left base of tongue region concerning for primary squamous cell carcinoma, as well as a solitary hypermetabolic metastatic left level II lymph node. 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 No additional evidence of metastatic squamous cell carcinoma was appreciated. 10/30/21 patient proceeded to undergo a direct laryngoscopy under anesthesia for biopsies of left tongue base revealing invasive moderately differentiated squamous cell carcinoma; p16 positive. Diagnostic comment on pathology noted a microscopic focus suspicious for lymphovascular space involvement. 11/06/21 Consult with Dr. Isidore Moos & 11/08/21 Consult with Dr. Chryl Heck. Radiation/Chemotherapy was recommended but patient decided to pursue radiation only.  He will receive 35 fractions of radiation to his Left tongue base and bilateral neck. He started on 12/03/21 and will complete on 01/23/22. He underwent left heart catheterization on 10/09/21 which revealed nonobstructive CAD. CT cardiac scoring performed on 08/26/21 revealed a coronary calcium score of 1183 (putting the patient in the 96th percentile for age-, race-, and sex-matched controls). He also had a stress performed prior to catheterization on 10/03/21 which showed severe LV systolic dysfunction, with an EF of 23%, and findings consistent with previous MI. This was one of the determinations to not receive chemotherapy.  PATIENT GOALS   to be educated about the signs and symptoms of lymphedema and learn post op HEP.   PAIN:  Are you having pain? Yes: NPRS scale: 1.5/10 Pain location: throat Pain description: sore Aggravating factors: eating Relieving factors: water   PRECAUTIONS: Active CA and Comment (afib)  WEIGHT BEARING  RESTRICTIONS No  FALLS:  Has patient fallen in last 6 months? No Does the patient have a fear of falling that limits activity? 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[employer], Dealer work  LEISURE: pt reports he walks daily, was doing 3 miles a day and now is doing about 2.5 miles  PRIOR LEVEL OF FUNCTION: Independent   OBJECTIVE  COGNITION:           Overall cognitive status: Within functional limits for tasks assessed                  POSTURE:  Forward head and rounded shoulders posture   30 SEC SIT TO STAND: 13 reps in 30 sec without use of UEs which is  Below average for patient's age   SHOULDER AROM:   WFL sometimes has issues with the L shoulder due to a tiny tear in the L rotator cuff back in 2002  CERVICAL AROM:   Percent limited  Flexion WFL  Extension WFL  Right lateral flexion WFL  Left lateral flexion WFL  Right rotation WFL  Left rotation WFL    (Blank rows=not tested)   GAIT:  Assessed: Yes Assistance needed: Independent  Ambulation Distance: 10 feet  Assistive Device: none Gait pattern: WFL Ambulation surface: Level  PATIENT EDUCATION:  Education details: Neck ROM, importance of posture when sitting, standing and lying down, deep breathing, walking program and importance of staying active throughout treatment, CURE article on staying active, "Why exercise?" flyer, lymphedema and PT info Person educated: Patient Education method: Explanation, Demonstration, Handout Education comprehension: Patient verbalized understanding and returned demonstration   HOME EXERCISE PROGRAM: Patient was instructed today in a home exercise program today for head and neck range of motion exercises. These included active  cervical flexion, active cervical extension, active cervical rotation to each direction, upper trap stretch, and shoulder retraction. Patient was encouraged to do these 2-3 times a day, holding for 5 sec each and completing for 5 reps. Pt was educated that once this becomes easier then hold the stretches for 30-60 seconds.    ASSESSMENT:  CLINICAL IMPRESSION: Pt arrives to PT with recently diagnosed left base of tongue cancer. Pt will undergo radiation to his left tongue bae and bilateral neck. Pt will start treatment on 12/03/21 and complete on 01/23/22.  Pt's cervical ROM was [facility]. Educated pt about signs and symptoms of lymphedema as well as anatomy and physiology of lymphatic system. Educated pt in importance of staying as active as possible throughout treatment to decrease fatigue as well as head and neck ROM exercises to decrease loss of ROM. Will see pt after completion of radiation to reassess ROM and assess for lymphedema and to determine therapy needs at that time.  Pt will benefit from skilled therapeutic intervention to improve on the following deficits: Decreased knowledge of precautions and postural dysfunction.   PT treatment/interventions: ADL/self-care home management, pt/family education, therapeutic exercise.   REHAB POTENTIAL: Excellent  CLINICAL DECISION MAKING: Stable/uncomplicated  EVALUATION COMPLEXITY: Low   GOALS: Goals reviewed with patient? YES  LONG TERM GOALS: (STG=LTG)   Name Target Date  Goal status  1 Patient will be able to verbalize understanding of a home exercise program for cervical range of motion, posture, and walking.   Baseline:  No knowledge 12/18/2021 Achieved at eval  2 Patient will be able to verbalize understanding of proper sitting and standing posture. Baseline:  No knowledge 12/18/2021 Achieved at eval  3 Patient will be able to verbalize understanding of lymphedema risk and availability of treatment for this condition Baseline:  No knowledge  12/18/2021 Achieved at eval  4 Pt will demonstrate a return to full cervical ROM and function post operatively compared to baselines and not demonstrate any signs or symptoms of lymphedema.  Baseline: See objective measurements taken today. 02/13/2022 New     PLAN: PT FREQUENCY/DURATION: EVAL and 1 follow up appointment.   PLAN FOR NEXT SESSION: will reassess 2 weeks after completion of radiation to determine needs.  Patient will follow up at outpatient cancer rehab 2 weeks after completion of radiation.  If the patient requires physical therapy at that time, a specific plan will be dictated and sent to the referring physician for approval. The patient was educated today on appropriate basic range of motion exercises to begin now and continue throughout radiation and educated on the signs and symptoms of lymphedema. Patient verbalized good understanding.  Physical Therapy Information for During and After Head/Neck Cancer Treatment: Lymphedema is a swelling condition that you may be at risk for in your neck and/or face if you have radiation treatment to the area and/or if you have surgery that includes removing lymph nodes.  There is treatment available for this condition and it is not life-threatening.  Contact your physician or physical therapist with concerns. An excellent resource for those seeking information on lymphedema is the National Lymphedema Network's website.  It can be accessed at [place].org If you notice swelling in your neck or face at any time following surgery (even if it is many years from now), please contact your doctor or physical therapist to discuss this.  Lymphedema can be treated at any time but it is easier for you if it is treated early on. If you have had surgery to your neck, please check with your surgeon about how soon to start doing neck range of motion exercises.  If you are not having surgery, I encourage you to start doing neck range of motion exercises  today and continue these while undergoing treatment, UNLESS you have irritation of your skin or soft tissue that is aggravated by doing them.  These exercises are intended to help you prevent loss of range of motion and/or to gain range of motion in your neck (which can be limited by tightening effects of radiation), and NOT to aggravate these tissues if they develop sensitivities from treatment. Neck range of motion exercises should be done to the point of feeling a GENTLE, TOLERABLE stretch only.  You are encouraged to start a walking or other exercise program tomorrow and continue this as much as you are able through and after treatment.  Please feel free to call me with any questions. Manus Gunning, PT, CLT Physical Therapist and Certified Lymphedema Therapist [facility] [address], [address], [place],  [address] [phone] [email or web address]  WALKING  Walking is a great form of exercise to increase your strength, endurance and overall fitness.  A walking program can help you start slowly and gradually build endurance as you go.  Everyone's ability is different, so each person's starting point will be different.  You do not have to follow them exactly.  The are just samples. You should simply find out what's right for you and stick to that program.   In the beginning, you'll start off walking 2-3 times a day for short distances.  As you get stronger, you'll be walking further at just 1-2 times per day.  A. You Can Walk For A Certain Length Of Time Each Day    Walk 5 minutes 3 times per day.  Increase 2 minutes every 2 days (3 times per day).  Work up to 25-30 minutes (1-2 times per day).   Example:   Day 1-2 5 minutes 3 times per day   Day 7-8 12 minutes 2-3 times per day   Day 13-14 25 minutes 1-2 times per day  B. You Can Walk For a Certain Distance Each Day     Distance can be substituted for time.    Example:   3 trips to  mailbox (at road)   3 trips to corner of block   3 trips around the block  C. Go to local high school and use the track.    Walk for distance ____ around track  Or time ____ minutes  D. Walk ____ Jog ____ Run ___   Why exercise?  So many benefits! Here are SOME of them: Heart health, including raising your good cholesterol level and reducing heart rate and blood pressure Lung health, including improved lung capacity It burns fats, and most of [place] can stand to be leaner, whether or not we are overweight. It increases the body's natural painkillers and mood elevators, so makes you feel better. Not only makes you feel better, but look better too Improves sleep Takes a bite out of stress May decrease your risk of many types of cancer If you are currently undergoing cancer treatment, exercise may improve your ability to tolerate treatments including chemotherapy. For everybody, it can improve your energy level. Those with cancer-related fatigue report a 40-50% reduction in this symptom when exercising regularly. If you are a survivor of breast, colon, or prostate cancer, it may decrease your risk of a recurrence. (This may hold for other cancers too, but so far we have data just for these three types.)  How to exercise: Get your doctor's okay. Pick something you enjoy doing, like walking, Zumba, biking, swimming, or whatever. Start at low intensity and time, then gradually increase.  (See walking program handout.) Set a goal to achieve over time.  The American Cancer Society, American Heart Association, and U.S. Dept. of Health and Human Services recommend 150 minutes of moderate exercise, 75 minutes of vigorous exercise, or a combination of both per week. This should be done in episodes at least 10 minutes long, spread throughout the week.  Need help being motivated? Pick something you enjoy doing, because you'll be more inclined to stick with that activity than something that feels like  a chore. Do it with a friend so that you are accountable to each other. Schedule it into your day. Place it on your calendar and keep that appointment just like you do any appointment that you make. Join an exercise group that meets at a specific time.  That way, you have to show up on time, and that makes it harder to procrastinate about doing your workout.  It also keeps you accountable--people begin to expect you to be there. Join a gym where you feel comfortable and not intimidated, at the right cost. Sign up for something that you'll need to be in shape for on a specific date, like a 1K or a 5K to walk or run, a 20 or 30 mile bike ride, a mud run or something like that. If the date is looming, you know you'll need to train to be ready for it.  An added benefit is that many of these are fundraisers for good causes. If you've already paid for a gym membership, group exercise class or event, you might as well work out, so you haven't wasted your money!    [facility], PT 12/19/2021, 9:42 AM

## 2021-12-19 ENCOUNTER — Encounter: Payer: Self-pay | Admitting: Physical Therapy

## 2021-12-19 ENCOUNTER — Ambulatory Visit: Payer: BC Managed Care – PPO | Attending: Radiation Oncology

## 2021-12-19 ENCOUNTER — Other Ambulatory Visit: Payer: Self-pay

## 2021-12-19 ENCOUNTER — Ambulatory Visit: Payer: BC Managed Care – PPO | Attending: Radiation Oncology | Admitting: Physical Therapy

## 2021-12-19 ENCOUNTER — Ambulatory Visit
Admission: RE | Admit: 2021-12-19 | Discharge: 2021-12-19 | Disposition: A | Discharge status: Home / Self Care | Payer: BC Managed Care – PPO | Source: Ambulatory Visit | Attending: Radiation Oncology | Admitting: Radiation Oncology

## 2021-12-19 DIAGNOSIS — C01 Malignant neoplasm of base of tongue: Secondary | ICD-10-CM | POA: Diagnosis not present

## 2021-12-19 DIAGNOSIS — R293 Abnormal posture: Secondary | ICD-10-CM | POA: Diagnosis not present

## 2021-12-19 DIAGNOSIS — R1310 Dysphagia, unspecified: Secondary | ICD-10-CM | POA: Diagnosis not present

## 2021-12-19 DIAGNOSIS — R490 Dysphonia: Secondary | ICD-10-CM

## 2021-12-19 DIAGNOSIS — Z510 Encounter for antineoplastic radiation therapy: Secondary | ICD-10-CM | POA: Diagnosis not present

## 2021-12-19 DIAGNOSIS — F17210 Nicotine dependence, cigarettes, uncomplicated: Secondary | ICD-10-CM | POA: Diagnosis not present

## 2021-12-19 LAB — RAD ONC ARIA SESSION SUMMARY
Course Elapsed Days: 14
Plan Fractions Treated to Date: 11
Plan Prescribed Dose Per Fraction: 2 Gy
Plan Total Fractions Prescribed: 35
Plan Total Prescribed Dose: 70 Gy
Reference Point Dosage Given to Date: 22 Gy
Reference Point Session Dosage Given: 2 Gy
Session Number: 11

## 2021-12-19 NOTE — Therapy (Addendum)
OUTPATIENT SPEECH LANGUAGE PATHOLOGY ONCOLOGY EVALUATION   Patient Name: Trevor Steele MRN: [id number] DOB:[birth year], 61 y.o., male Today's Date: 12/19/2021  PCP: [name], MD  REFERRING PROVIDER: [name], MD   End of Session - 12/19/21 2141     Visit Number 1    Number of Visits 4    Date for SLP Re-Evaluation 03/19/22    SLP Start Time 0810    SLP Stop Time  0850    SLP Time Calculation (min) 40 min    Activity Tolerance Patient tolerated treatment well             Past Medical History:  Diagnosis Date   Agatston coronary artery calcium score greater than 400    coronary calcium score high at 1183 which is 21 percentile for age, race and sex matched controls.   Aortic atherosclerosis (HCC)    Aortic stenosis    COVID 06/28/2021   no symptoms, went to hospital with a-fib and diagnosed then   Diabetes mellitus without complication ([place])    Dysrhythmia    A-fib   GERD (gastroesophageal reflux disease)    High cholesterol    History of kidney stones    Hypertension    Mild dilation of ascending aorta ([place])    by echo 07/2021 though not corroborated on CT   Mitral regurgitation    Neck mass    Abnormal neck CT 07/2021 concerning for nodal metastasis   PAF (paroxysmal atrial fibrillation) (HCC)    Pneumonia    Pulmonary nodules    PVC's (premature ventricular contractions)    Past Surgical History:  Procedure Laterality Date   BACK SURGERY     BACK SURGERY     lumbar   LARYNGOSCOPY AND ESOPHAGOSCOPY N/A 10/30/2021   Procedure: DIRECT LARYNGOSCOPY WITH BIOPSY; ESOPHAGOSCOPY;  Surgeon: [name], MD;  Location: [place];  Service: ENT;  Laterality: N/A;   LEFT HEART CATH AND CORONARY ANGIOGRAPHY N/A 10/09/2021   Procedure: LEFT HEART CATH AND CORONARY ANGIOGRAPHY;  Surgeon: Burnell Blanks, MD;  Location: [place] [facility];  Service: Cardiovascular;  Laterality: N/A;   TONSILLECTOMY Bilateral 10/30/2021   Procedure: TONSILLECTOMY; FROZEN SECTION;   Surgeon: [name], MD;  Location: [place];  Service: ENT;  Laterality: Bilateral;   Patient Active Problem List   Diagnosis Date Noted   Encounter for preoperative dental examination 11/19/2021   Teeth missing 11/19/2021   Caries 11/19/2021   Incipient enamel caries 11/19/2021   Accretions on teeth 11/19/2021   Chronic periodontitis 11/19/2021   Impacted third molar tooth 11/19/2021   Loose, teeth 11/19/2021   Malocclusion 11/19/2021   Dental attrition, excessive, limited to enamel 11/19/2021   Defective dental restoration 11/19/2021   Malignant neoplasm of base of tongue ([place]) 11/06/2021   Abnormal stress test    Atrial fibrillation with RVR ([place]) 08/02/2021   Type 2 diabetes mellitus ([place]) 08/02/2021   Essential hypertension 08/02/2021   Anticoagulated (apixaban) 08/02/2021   Hyperlipidemia associated with type 2 diabetes mellitus ([place]) 08/02/2021   High risk medication use (pioglitazone) 08/02/2021   Intermittent heart palpitations 08/02/2021   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 Pt accompanied by: self  PERTINENT HISTORY:  He was incidentally found to have a left neck mass/swelling while hospitalized for management for a-fib on 08/02/21. A soft tissue neck CT performed on 08/02/21 revealed enlarged, centrally necrotic left level 2A lymph nodes concerning for nodal metastasis without a definite primary. CT chest abdomen pelvis on 08/03/21 showed no evidence of primary malignancy in his chest abdomen or pelvis. 08/16/21 he was seen by Dr. Constance Holster. FNA in  office revealed scattered sheets of epithelial cells with a background of acute inflammation and cellular degeneration.09/12/21 PET demonstrated asymmetric hypermetabolic activity in the deep left base of tongue region concerning for primary squamous cell carcinoma, as well as a solitary hypermetabolic metastatic left level II lymph node. 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 No additional evidence of metastatic squamous cell carcinoma was appreciated. 10/30/21 patient proceeded to undergo a direct laryngoscopy under anesthesia for biopsies of left tongue base revealing invasive moderately differentiated squamous cell carcinoma; p16 positive. Diagnostic comment on pathology noted a microscopic focus suspicious for lymphovascular space involvement.  11/06/21 Consult with Dr. Isidore Moos & 11/08/21 Consult with Dr. Chryl Heck. Radiation/Chemotherapy was recommended but patient decided to pursue radiation only.  Treatment plan:  He will receive 35 fractions of radiation to his Left tongue base and bilateral neck. He started on 12/03/21 and will complete on 01/23/22.  PAIN:  Are you having pain? Yes: NPRS scale: 3/10 Pain location: throat Pain description: sore Aggravating factors: swallowing   FALLS: Has patient fallen in last 6 months?  No  LIVING ENVIRONMENT: Lives with: lives with their spouse Lives in: House/apartment  PLOF:  Level of assistance: Independent with ADLs Employment: Full-time employment   PATIENT GOALS  "I want to keep my swallowing!"  OBJECTIVE:   DIAGNOSTIC FINDINGS: See above in "pertinent history"  COGNITION: Overall cognitive status: Within functional limits for tasks assessed   LANGUAGE: Receptive and Expressive language appeared WNL.  ORAL MOTOR EXAMINATION Overall status: WFL  MOTOR SPEECH: Overall motor speech: Appears intact Mild hoarseness noted.    CLINICAL SWALLOW ASSESSMENT:   Current diet: regular and thin liquids Objective swallow impairments: drier foods a little more difficult to clear but likely to decr'd saliva production due to rad tx. Objective recommended compensations: pt uses more H2O Dentition: adequate natural dentition Patient directly observed with POs: Yes: dysphagia 3 (soft) and thin liquids  Feeding: able to feed self Liquids provided by: cup Oral phase signs and symptoms: prolonged mastication and prolonged bolus formation Pharyngeal phase signs and symptoms:  none noted today   TODAY'S TREATMENT:  Research states the risk for dysphagia increases due to radiation and/or chemotherapy treatment due to a variety of factors, so SLP educated the pt about the possibility of reduced/limited ability for PO intake during rad tx. SLP also educated pt regarding possible changes to swallowing musculature after rad tx, and why adherence to dysphagia HEP provided today and PO consumption was necessary to inhibit muscle fibrosis following rad tx and to mitigate muscle disuse atrophy. SLP informed pt why this would be detrimental to their swallowing status and to their pulmonary health. Pt demonstrated understanding of these things to SLP. SLP encouraged pt to safely eat and drink as deep into their radiation/chemotherapy as possible to provide the best possible long-term swallowing outcome for  pt.    SLP then developed an individualized HEP for pt involving oral and pharyngeal strengthening and ROM and pt was instructed how to perform these exercises, including SLP demonstration. After SLP demonstration, pt return demonstrated each exercise. SLP ensured pt performance was correct prior to educating pt on next exercise. Pt required demo and occasional mod cues faded to modified independent to perform HEP. Pt was instructed to complete this program 6-7 days/week, at least 2 times a day until 6 months after his or her last day of rad tx,  and then x2 a week after that, indefinitely. Among other modifications for days when pt cannot functionally swallow, SLP also suggested pt to perform only non-swallowing tasks on the handout/HEP, and if necessary to cycle through the swallowing portion so the full program of exercises can be completed instead of fatiguing on one of the swallowing exercises and being unable to perform the other swallowing exercises. SLP instructed that swallowing exercises should then be added back into the regimen as pt is able to do so. Secondly, pt was told that former patients have told SLP that during their course of radiation therapy, taking prescribed pain medication just prior to performing HEP (and eating/drinking) has proven helpful in completing HEP (and eating and drinking) more regularly when going through their course of radiation treatment.    PATIENT EDUCATION: Education details: late effects head/neck radiation on swallow function, HEP procedure, and modification to HEP when difficulty experienced with swallowing during and after radiation course Person educated: Patient Education method: Explanation, Demonstration, Verbal cues, and Handouts Education comprehension: verbalized understanding, returned demonstration, verbal cues required, and needs further education   ASSESSMENT:  CLINICAL IMPRESSION: Patient is a 61 y.o. male who was seen today for assessment of swallowing as they undergo radiation/chemoradiation therapy. Today pt ate [place] sandwich and drank thin liquids without overt s/s oral or pharyngeal difficulty. At this time pt swallowing is deemed WNL/WFL with these POs. No oral or overt s/sx pharyngeal deficits, including aspiration were observed. There are no overt s/s aspiration PNA observed by SLP nor any reported by pt at this time. Data indicate that pt's swallow ability will likely decrease over the course of radiation/chemoradiation therapy and could very well decline over time following  the conclusion of that therapy due to muscle disuse atrophy and/or muscle fibrosis. Pt will cont to need to be seen by SLP in order to assess safety of PO intake, assess the need for recommending any objective swallow assessment, and ensuring pt is correctly completing the individualized HEP.  OBJECTIVE IMPAIRMENTS include voice disorder and dysphagia. These impairments are limiting patient from return to work, ADLs/IADLs, effectively communicating at home and in community, and safety when swallowing. Factors affecting potential to achieve goals and functional outcome are  none . Patient will benefit from skilled SLP services to address above impairments and improve overall function.  REHAB POTENTIAL: Good   GOALS: Goals reviewed with patient? Yes  SHORT TERM GOALS: Target :  4th visit     pt will complete HEP with rare min A  Baseline: Goal status: INITIAL  2.  pt will tell SLP why pt is completing HEP with modified independence Baseline:  Goal status: INITIAL  3.  pt will describe 3 overt s/s aspiration PNA with modified independence Baseline:  Goal status: INITIAL  4.  pt will tell SLP how a food journal could hasten return to a more normalized diet Baseline:  Goal status: INITIAL   LONG TERM GOALS: Target :  7th visit     pt will complete HEP with modified independence over two visits Baseline:  Goal status: INITIAL  2.  pt will describe how to modify HEP over time, and the timeline associated with reduction in HEP frequency with modified independence over two sessions Baseline:  Goal status: INITIAL   PLAN: SLP FREQUENCY:  once approx every 4 weeks  SLP DURATION:  7 sessions  PLANNED INTERVENTIONS: Aspiration precaution training, Pharyngeal strengthening exercises, Diet toleration management , Trials of upgraded texture/liquids, Oral motor exercises, SLP instruction and feedback, Compensatory strategies, and Patient/family education    [facility],  [place] 12/19/2021, 10:01 PM

## 2021-12-19 NOTE — Progress Notes (Signed)
Oncology Nurse Navigator Documentation   Mr. 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 He is tolerating radiation well at this time. He knows to contact me if he has any questions or concerns in the future.   Harlow Asa RN, BSN, OCN Head & Neck Oncology Nurse [place] [facility] Phone # [phone]  Fax # [phone]

## 2021-12-19 NOTE — Addendum Note (Signed)
Addended by: Garald Balding B on: 12/19/2021 10:04 PM   Modules accepted: Orders

## 2021-12-19 NOTE — Addendum Note (Signed)
Addended by: Garald Balding B on: 12/19/2021 10:53 PM   Modules accepted: Orders

## 2021-12-19 NOTE — Patient Instructions (Signed)
SWALLOWING EXERCISES Do these until 6 months after your last day of radiation, then 2-3 times per week afterwards  Effortful Swallows - Press your tongue against the roof of your mouth for 3 seconds, then squeeze the muscles in your neck while you swallow your saliva or a sip of water - Repeat 10-15 times, 2-3 times a day, and use whenever you eat or drink  [place] Swallow - swallow with your tongue sticking out - Stick tongue out past your lips and gently bite tongue with your teeth - Swallow, while holding your tongue with your teeth - Repeat 10-15 times, 2-3 times a day *use a wet spoon if your mouth gets dry*  Mendelsohn Maneuver - "half swallow" exercise - Start to swallow, and keep your Adam's apple up by squeezing hard with the muscles of the throat - Hold the squeeze for 5-7 seconds and then relax - Repeat 10-15 times, 2-3 times a day *use a wet spoon if your mouth gets dry*       4.   "Super Swallow"  - Take a breath and hold it  - Bear down (like pushing your bowels)  - Swallow then IMMEDIATELY cough  - Repeat 10 times, 2-3 times a day

## 2021-12-20 ENCOUNTER — Ambulatory Visit
Admission: RE | Admit: 2021-12-20 | Discharge: 2021-12-20 | Disposition: A | Discharge status: Home / Self Care | Payer: BC Managed Care – PPO | Source: Ambulatory Visit | Attending: Radiation Oncology | Admitting: Radiation Oncology

## 2021-12-20 ENCOUNTER — Other Ambulatory Visit: Payer: Self-pay

## 2021-12-20 DIAGNOSIS — F17210 Nicotine dependence, cigarettes, uncomplicated: Secondary | ICD-10-CM | POA: Diagnosis not present

## 2021-12-20 DIAGNOSIS — Z510 Encounter for antineoplastic radiation therapy: Secondary | ICD-10-CM | POA: Diagnosis not present

## 2021-12-20 DIAGNOSIS — C01 Malignant neoplasm of base of tongue: Secondary | ICD-10-CM | POA: Diagnosis not present

## 2021-12-20 LAB — RAD ONC ARIA SESSION SUMMARY
Course Elapsed Days: 15
Plan Fractions Treated to Date: 12
Plan Prescribed Dose Per Fraction: 2 Gy
Plan Total Fractions Prescribed: 35
Plan Total Prescribed Dose: 70 Gy
Reference Point Dosage Given to Date: 24 Gy
Reference Point Session Dosage Given: 2 Gy
Session Number: 12

## 2021-12-23 ENCOUNTER — Inpatient Hospital Stay: Payer: BC Managed Care – PPO | Admitting: Nutrition

## 2021-12-23 ENCOUNTER — Ambulatory Visit
Admission: RE | Admit: 2021-12-23 | Discharge: 2021-12-23 | Disposition: A | Discharge status: Home / Self Care | Payer: BC Managed Care – PPO | Source: Ambulatory Visit | Attending: Radiation Oncology | Admitting: Radiation Oncology

## 2021-12-23 ENCOUNTER — Ambulatory Visit: Payer: BC Managed Care – PPO

## 2021-12-23 ENCOUNTER — Other Ambulatory Visit: Payer: Self-pay

## 2021-12-23 ENCOUNTER — Other Ambulatory Visit: Payer: Self-pay | Admitting: Radiation Oncology

## 2021-12-23 DIAGNOSIS — C01 Malignant neoplasm of base of tongue: Secondary | ICD-10-CM

## 2021-12-23 DIAGNOSIS — Z510 Encounter for antineoplastic radiation therapy: Secondary | ICD-10-CM | POA: Diagnosis not present

## 2021-12-23 DIAGNOSIS — F17210 Nicotine dependence, cigarettes, uncomplicated: Secondary | ICD-10-CM | POA: Diagnosis not present

## 2021-12-23 LAB — RAD ONC ARIA SESSION SUMMARY
Course Elapsed Days: 18
Plan Fractions Treated to Date: 13
Plan Prescribed Dose Per Fraction: 2 Gy
Plan Total Fractions Prescribed: 35
Plan Total Prescribed Dose: 70 Gy
Reference Point Dosage Given to Date: 26 Gy
Reference Point Session Dosage Given: 2 Gy
Session Number: 13

## 2021-12-23 MED ORDER — HYDROCODONE-ACETAMINOPHEN 7.5-325 MG/15ML PO SOLN: 10.0000 mL | ORAL | 0 refills | Status: DC | PRN

## 2021-12-23 NOTE — Progress Notes (Signed)
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   Last radiation treatment is scheduled for September 21.  Weight documented as 191.6 pounds decreased from 192 pounds 6 ounces.  Patient states his throat pain and dryness is about the same.  States his pain medication helps but the lidocaine does not seem to improve swallowing.  He is still able to tolerate soft textures and has been using gravy when needed.  Reports drinking 1-2 cartons of Ensure Plus high-protein a day.  Consumes at least 80 ounces of water.  Reports dry mouth but has been using baking soda and salt water rinses and reports he really feels benefit.  Nutrition diagnosis:  Food and nutrition related knowledge deficit, improved.  Intervention: Continue strategies for consuming soft foods with adequate calories and protein. Increase Ensure Plus high-protein to 2-3 cartons daily. Provided complementary case of Ensure Plus high-protein. Continue baking soda and salt water rinses along with adequate hydration. Provided additional tips for dry mouth.  Monitoring, evaluation, goals: Patient will tolerate adequate calories and protein to minimize weight loss.  Next visit: Monday, August 28 after radiation therapy.  **Disclaimer: This note was dictated with voice recognition software. Similar sounding words can inadvertently be transcribed and this note may contain transcription errors which may not have been corrected upon publication of note.**

## 2021-12-24 ENCOUNTER — Ambulatory Visit
Admission: RE | Admit: 2021-12-24 | Discharge: 2021-12-24 | Disposition: A | Discharge status: Home / Self Care | Payer: BC Managed Care – PPO | Source: Ambulatory Visit | Attending: Radiation Oncology | Admitting: Radiation Oncology

## 2021-12-24 ENCOUNTER — Other Ambulatory Visit: Payer: Self-pay

## 2021-12-24 DIAGNOSIS — Z510 Encounter for antineoplastic radiation therapy: Secondary | ICD-10-CM | POA: Diagnosis not present

## 2021-12-24 DIAGNOSIS — C01 Malignant neoplasm of base of tongue: Secondary | ICD-10-CM | POA: Diagnosis not present

## 2021-12-24 DIAGNOSIS — F17210 Nicotine dependence, cigarettes, uncomplicated: Secondary | ICD-10-CM | POA: Diagnosis not present

## 2021-12-24 LAB — RAD ONC ARIA SESSION SUMMARY
Course Elapsed Days: 19
Plan Fractions Treated to Date: 14
Plan Prescribed Dose Per Fraction: 2 Gy
Plan Total Fractions Prescribed: 35
Plan Total Prescribed Dose: 70 Gy
Reference Point Dosage Given to Date: 28 Gy
Reference Point Session Dosage Given: 2 Gy
Session Number: 14

## 2021-12-25 ENCOUNTER — Other Ambulatory Visit: Payer: Self-pay

## 2021-12-25 ENCOUNTER — Ambulatory Visit
Admission: RE | Admit: 2021-12-25 | Discharge: 2021-12-25 | Disposition: A | Discharge status: Home / Self Care | Payer: BC Managed Care – PPO | Source: Ambulatory Visit | Attending: Radiation Oncology | Admitting: Radiation Oncology

## 2021-12-25 DIAGNOSIS — Z510 Encounter for antineoplastic radiation therapy: Secondary | ICD-10-CM | POA: Diagnosis not present

## 2021-12-25 DIAGNOSIS — C01 Malignant neoplasm of base of tongue: Secondary | ICD-10-CM | POA: Diagnosis not present

## 2021-12-25 DIAGNOSIS — F17210 Nicotine dependence, cigarettes, uncomplicated: Secondary | ICD-10-CM | POA: Diagnosis not present

## 2021-12-25 LAB — RAD ONC ARIA SESSION SUMMARY
Course Elapsed Days: 20
Plan Fractions Treated to Date: 15
Plan Prescribed Dose Per Fraction: 2 Gy
Plan Total Fractions Prescribed: 35
Plan Total Prescribed Dose: 70 Gy
Reference Point Dosage Given to Date: 30 Gy
Reference Point Session Dosage Given: 2 Gy
Session Number: 15

## 2021-12-26 ENCOUNTER — Ambulatory Visit: Payer: BC Managed Care – PPO | Admitting: Internal Medicine

## 2021-12-26 ENCOUNTER — Other Ambulatory Visit: Payer: Self-pay | Admitting: Radiation Oncology

## 2021-12-26 ENCOUNTER — Telehealth: Payer: Self-pay | Admitting: *Deleted

## 2021-12-26 ENCOUNTER — Ambulatory Visit
Admission: RE | Admit: 2021-12-26 | Discharge: 2021-12-26 | Disposition: A | Discharge status: Home / Self Care | Payer: BC Managed Care – PPO | Source: Ambulatory Visit | Attending: Radiation Oncology | Admitting: Radiation Oncology

## 2021-12-26 ENCOUNTER — Other Ambulatory Visit: Payer: Self-pay

## 2021-12-26 DIAGNOSIS — C01 Malignant neoplasm of base of tongue: Secondary | ICD-10-CM

## 2021-12-26 DIAGNOSIS — F17210 Nicotine dependence, cigarettes, uncomplicated: Secondary | ICD-10-CM | POA: Diagnosis not present

## 2021-12-26 DIAGNOSIS — Z510 Encounter for antineoplastic radiation therapy: Secondary | ICD-10-CM | POA: Diagnosis not present

## 2021-12-26 LAB — RAD ONC ARIA SESSION SUMMARY
Course Elapsed Days: 21
Plan Fractions Treated to Date: 16
Plan Prescribed Dose Per Fraction: 2 Gy
Plan Total Fractions Prescribed: 35
Plan Total Prescribed Dose: 70 Gy
Reference Point Dosage Given to Date: 32 Gy
Reference Point Session Dosage Given: 2 Gy
Session Number: 16

## 2021-12-26 MED ORDER — HYDROCODONE-ACETAMINOPHEN 7.5-325 MG/15ML PO SOLN: 10.0000 mL | ORAL | 0 refills | Status: DC | PRN

## 2021-12-26 NOTE — Telephone Encounter (Signed)
Spoke with the patients wife to let her know that we have sent in a new prescription to [facility] for his pain medication.  She verbalized understanding.  Gloriajean Dell. Leonie Green, BSN

## 2021-12-27 ENCOUNTER — Other Ambulatory Visit: Payer: Self-pay

## 2021-12-27 ENCOUNTER — Ambulatory Visit
Admission: RE | Admit: 2021-12-27 | Discharge: 2021-12-27 | Disposition: A | Discharge status: Home / Self Care | Payer: BC Managed Care – PPO | Source: Ambulatory Visit | Attending: Radiation Oncology | Admitting: Radiation Oncology

## 2021-12-27 DIAGNOSIS — C01 Malignant neoplasm of base of tongue: Secondary | ICD-10-CM | POA: Diagnosis not present

## 2021-12-27 DIAGNOSIS — Z510 Encounter for antineoplastic radiation therapy: Secondary | ICD-10-CM | POA: Diagnosis not present

## 2021-12-27 DIAGNOSIS — F17210 Nicotine dependence, cigarettes, uncomplicated: Secondary | ICD-10-CM | POA: Diagnosis not present

## 2021-12-27 LAB — RAD ONC ARIA SESSION SUMMARY
Course Elapsed Days: 22
Plan Fractions Treated to Date: 17
Plan Prescribed Dose Per Fraction: 2 Gy
Plan Total Fractions Prescribed: 35
Plan Total Prescribed Dose: 70 Gy
Reference Point Dosage Given to Date: 34 Gy
Reference Point Session Dosage Given: 2 Gy
Session Number: 17

## 2021-12-30 ENCOUNTER — Inpatient Hospital Stay: Payer: BC Managed Care – PPO | Admitting: Nutrition

## 2021-12-30 ENCOUNTER — Ambulatory Visit: Payer: BC Managed Care – PPO

## 2021-12-30 ENCOUNTER — Other Ambulatory Visit: Payer: Self-pay

## 2021-12-30 ENCOUNTER — Ambulatory Visit
Admission: RE | Admit: 2021-12-30 | Discharge: 2021-12-30 | Disposition: A | Discharge status: Home / Self Care | Payer: BC Managed Care – PPO | Source: Ambulatory Visit | Attending: Radiation Oncology | Admitting: Radiation Oncology

## 2021-12-30 DIAGNOSIS — F17210 Nicotine dependence, cigarettes, uncomplicated: Secondary | ICD-10-CM | POA: Diagnosis not present

## 2021-12-30 DIAGNOSIS — C01 Malignant neoplasm of base of tongue: Secondary | ICD-10-CM | POA: Diagnosis not present

## 2021-12-30 DIAGNOSIS — Z510 Encounter for antineoplastic radiation therapy: Secondary | ICD-10-CM | POA: Diagnosis not present

## 2021-12-30 LAB — RAD ONC ARIA SESSION SUMMARY
Course Elapsed Days: 25
Plan Fractions Treated to Date: 18
Plan Prescribed Dose Per Fraction: 2 Gy
Plan Total Fractions Prescribed: 35
Plan Total Prescribed Dose: 70 Gy
Reference Point Dosage Given to Date: 36 Gy
Reference Point Session Dosage Given: 2 Gy
Session Number: 18

## 2021-12-30 NOTE — Progress Notes (Signed)
Nutrition follow-up completed with patient after radiation therapy: SCC of the oropharynx.  Last radiation treatment scheduled on September 21.  Patient reports he has lost a few pounds since last visit.  Current weight is not in [place] yet.  Reports throat pain and dryness continues.  Pain medication helps a bit.  Reports tolerating small amounts of scrambled eggs with bacon, oatmeal, pudding, yogurt, and whole milk.  He has been drinking about 3 Ensure Plus high-protein daily.  Expresses surprise over weight loss.  He has increasing fatigue.  Nutrition diagnosis: Food and nutrition related knowledge deficit, improved.  Intervention: Continue soft moist foods or pured foods as tolerated every 2-3 hours. Provided a sample of boost VHC and encourage patient to drink 4-5 cartons daily. Continue baking soda and salt water rinses along with increased hydration.  Monitoring, evaluation, goals: Patient will tolerate adequate calories and protein to minimize weight loss.  Next visit: Tuesday, September 5 with [facility].  **Disclaimer: This note was dictated with voice recognition software. Similar sounding words can inadvertently be transcribed and this note may contain transcription errors which may not have been corrected upon publication of note.**

## 2021-12-31 ENCOUNTER — Other Ambulatory Visit: Payer: Self-pay

## 2021-12-31 ENCOUNTER — Ambulatory Visit
Admission: RE | Admit: 2021-12-31 | Discharge: 2021-12-31 | Disposition: A | Discharge status: Home / Self Care | Payer: BC Managed Care – PPO | Source: Ambulatory Visit | Attending: Radiation Oncology | Admitting: Radiation Oncology

## 2021-12-31 ENCOUNTER — Encounter: Payer: BC Managed Care – PPO | Admitting: Dietician

## 2021-12-31 DIAGNOSIS — Z510 Encounter for antineoplastic radiation therapy: Secondary | ICD-10-CM | POA: Diagnosis not present

## 2021-12-31 DIAGNOSIS — F17210 Nicotine dependence, cigarettes, uncomplicated: Secondary | ICD-10-CM | POA: Diagnosis not present

## 2021-12-31 DIAGNOSIS — C01 Malignant neoplasm of base of tongue: Secondary | ICD-10-CM | POA: Diagnosis not present

## 2021-12-31 LAB — RAD ONC ARIA SESSION SUMMARY
Course Elapsed Days: 26
Plan Fractions Treated to Date: 19
Plan Prescribed Dose Per Fraction: 2 Gy
Plan Total Fractions Prescribed: 35
Plan Total Prescribed Dose: 70 Gy
Reference Point Dosage Given to Date: 38 Gy
Reference Point Session Dosage Given: 2 Gy
Session Number: 19

## 2022-01-01 ENCOUNTER — Other Ambulatory Visit: Payer: Self-pay

## 2022-01-01 ENCOUNTER — Ambulatory Visit
Admission: RE | Admit: 2022-01-01 | Discharge: 2022-01-01 | Disposition: A | Discharge status: Home / Self Care | Payer: BC Managed Care – PPO | Source: Ambulatory Visit | Attending: Radiation Oncology | Admitting: Radiation Oncology

## 2022-01-01 DIAGNOSIS — F17210 Nicotine dependence, cigarettes, uncomplicated: Secondary | ICD-10-CM | POA: Diagnosis not present

## 2022-01-01 DIAGNOSIS — C01 Malignant neoplasm of base of tongue: Secondary | ICD-10-CM | POA: Diagnosis not present

## 2022-01-01 DIAGNOSIS — Z510 Encounter for antineoplastic radiation therapy: Secondary | ICD-10-CM | POA: Diagnosis not present

## 2022-01-01 LAB — RAD ONC ARIA SESSION SUMMARY
Course Elapsed Days: 27
Plan Fractions Treated to Date: 20
Plan Prescribed Dose Per Fraction: 2 Gy
Plan Total Fractions Prescribed: 35
Plan Total Prescribed Dose: 70 Gy
Reference Point Dosage Given to Date: 40 Gy
Reference Point Session Dosage Given: 2 Gy
Session Number: 20

## 2022-01-02 ENCOUNTER — Ambulatory Visit
Admission: RE | Admit: 2022-01-02 | Discharge: 2022-01-02 | Disposition: A | Discharge status: Home / Self Care | Payer: BC Managed Care – PPO | Source: Ambulatory Visit | Attending: Radiation Oncology | Admitting: Radiation Oncology

## 2022-01-02 ENCOUNTER — Other Ambulatory Visit: Payer: Self-pay

## 2022-01-02 DIAGNOSIS — C01 Malignant neoplasm of base of tongue: Secondary | ICD-10-CM | POA: Diagnosis not present

## 2022-01-02 DIAGNOSIS — Z510 Encounter for antineoplastic radiation therapy: Secondary | ICD-10-CM | POA: Diagnosis not present

## 2022-01-02 DIAGNOSIS — F17210 Nicotine dependence, cigarettes, uncomplicated: Secondary | ICD-10-CM | POA: Diagnosis not present

## 2022-01-02 LAB — RAD ONC ARIA SESSION SUMMARY
Course Elapsed Days: 28
Plan Fractions Treated to Date: 21
Plan Prescribed Dose Per Fraction: 2 Gy
Plan Total Fractions Prescribed: 35
Plan Total Prescribed Dose: 70 Gy
Reference Point Dosage Given to Date: 42 Gy
Reference Point Session Dosage Given: 2 Gy
Session Number: 21

## 2022-01-03 ENCOUNTER — Other Ambulatory Visit: Payer: Self-pay | Admitting: Radiation Oncology

## 2022-01-03 ENCOUNTER — Ambulatory Visit: Payer: BC Managed Care – PPO

## 2022-01-03 DIAGNOSIS — C01 Malignant neoplasm of base of tongue: Secondary | ICD-10-CM

## 2022-01-03 MED ORDER — HYDROCODONE-ACETAMINOPHEN 7.5-325 MG/15ML PO SOLN: 10.0000 mL | ORAL | 0 refills | Status: DC | PRN

## 2022-01-07 ENCOUNTER — Other Ambulatory Visit: Payer: Self-pay

## 2022-01-07 ENCOUNTER — Inpatient Hospital Stay: Payer: BC Managed Care – PPO | Attending: Hematology and Oncology | Admitting: Dietician

## 2022-01-07 ENCOUNTER — Ambulatory Visit
Admission: RE | Admit: 2022-01-07 | Discharge: 2022-01-07 | Disposition: A | Discharge status: Home / Self Care | Payer: BC Managed Care – PPO | Source: Ambulatory Visit | Attending: Radiation Oncology | Admitting: Radiation Oncology

## 2022-01-07 ENCOUNTER — Ambulatory Visit: Payer: BC Managed Care – PPO

## 2022-01-07 DIAGNOSIS — F17210 Nicotine dependence, cigarettes, uncomplicated: Secondary | ICD-10-CM | POA: Diagnosis not present

## 2022-01-07 DIAGNOSIS — C01 Malignant neoplasm of base of tongue: Secondary | ICD-10-CM | POA: Insufficient documentation

## 2022-01-07 DIAGNOSIS — Z510 Encounter for antineoplastic radiation therapy: Secondary | ICD-10-CM | POA: Diagnosis not present

## 2022-01-07 LAB — RAD ONC ARIA SESSION SUMMARY
Course Elapsed Days: 33
Plan Fractions Treated to Date: 22
Plan Prescribed Dose Per Fraction: 2 Gy
Plan Total Fractions Prescribed: 35
Plan Total Prescribed Dose: 70 Gy
Reference Point Dosage Given to Date: 44 Gy
Reference Point Session Dosage Given: 2 Gy
Session Number: 22

## 2022-01-07 NOTE — Progress Notes (Signed)
Nutrition Follow-up:  Patient with SCC of oropharynx. He is receiving radiation therapy and has completed 22 of 35 planned fx.   Met with patient in office after treatment. He reports neck irritation that is becoming bothersome in the last few days. He is thinking of not wearing collared pajama shirts to bed as these rub that area through out the night. Patient reports persistent sore throat which he thought may ease up given he had four treatment free days last week. Patient is eating soft moist foods. He managed to eat a hamburger yesterday. This took him time but he got it down except a few bites of the bun. He is drinking at least 64 ounces of water. Patient drinks 2-3 large glasses of whole milk and 5 Boost daily. He ordered Boost VHC last week bu this has not arrived yet. Patient shares he is grateful for the church community as they have supplied him with ~200 bottles of Boost/Ensure supplements.    Medications: Hycet (9/1)  Labs: no new labs for review  Anthropometrics: Weight 187 lb slightly decreased from 188.4 lb on 8/28  8/21 - 191.6 lb    NUTRITION DIAGNOSIS: Food and nutrition related knowledge deficit, improved.   INTERVENTION:  Continue eating soft moist foods frequently through out the day Continue drinking Boost Plus/equivalent - 5/day Patient awaiting shipment of Boost VHC Continue baking soda salt water rinses several times daily    MONITORING, EVALUATION, GOAL: Weight trends, intake    NEXT VISIT: Tuesday September 12 after radiation

## 2022-01-08 ENCOUNTER — Ambulatory Visit
Admission: RE | Admit: 2022-01-08 | Discharge: 2022-01-08 | Disposition: A | Discharge status: Home / Self Care | Payer: BC Managed Care – PPO | Source: Ambulatory Visit | Attending: Radiation Oncology | Admitting: Radiation Oncology

## 2022-01-08 ENCOUNTER — Other Ambulatory Visit: Payer: Self-pay

## 2022-01-08 DIAGNOSIS — F17210 Nicotine dependence, cigarettes, uncomplicated: Secondary | ICD-10-CM | POA: Diagnosis not present

## 2022-01-08 DIAGNOSIS — C01 Malignant neoplasm of base of tongue: Secondary | ICD-10-CM | POA: Diagnosis not present

## 2022-01-08 DIAGNOSIS — Z510 Encounter for antineoplastic radiation therapy: Secondary | ICD-10-CM | POA: Diagnosis not present

## 2022-01-08 LAB — RAD ONC ARIA SESSION SUMMARY
Course Elapsed Days: 34
Plan Fractions Treated to Date: 23
Plan Prescribed Dose Per Fraction: 2 Gy
Plan Total Fractions Prescribed: 35
Plan Total Prescribed Dose: 70 Gy
Reference Point Dosage Given to Date: 46 Gy
Reference Point Session Dosage Given: 2 Gy
Session Number: 23

## 2022-01-09 ENCOUNTER — Other Ambulatory Visit: Payer: Self-pay

## 2022-01-09 ENCOUNTER — Ambulatory Visit
Admission: RE | Admit: 2022-01-09 | Discharge: 2022-01-09 | Disposition: A | Discharge status: Home / Self Care | Payer: BC Managed Care – PPO | Source: Ambulatory Visit | Attending: Radiation Oncology | Admitting: Radiation Oncology

## 2022-01-09 DIAGNOSIS — C01 Malignant neoplasm of base of tongue: Secondary | ICD-10-CM | POA: Diagnosis not present

## 2022-01-09 DIAGNOSIS — F17210 Nicotine dependence, cigarettes, uncomplicated: Secondary | ICD-10-CM | POA: Diagnosis not present

## 2022-01-09 DIAGNOSIS — Z510 Encounter for antineoplastic radiation therapy: Secondary | ICD-10-CM | POA: Diagnosis not present

## 2022-01-09 LAB — RAD ONC ARIA SESSION SUMMARY
Course Elapsed Days: 35
Plan Fractions Treated to Date: 24
Plan Prescribed Dose Per Fraction: 2 Gy
Plan Total Fractions Prescribed: 35
Plan Total Prescribed Dose: 70 Gy
Reference Point Dosage Given to Date: 48 Gy
Reference Point Session Dosage Given: 2 Gy
Session Number: 24

## 2022-01-10 ENCOUNTER — Other Ambulatory Visit: Payer: Self-pay

## 2022-01-10 ENCOUNTER — Ambulatory Visit
Admission: RE | Admit: 2022-01-10 | Discharge: 2022-01-10 | Disposition: A | Discharge status: Home / Self Care | Payer: BC Managed Care – PPO | Source: Ambulatory Visit | Attending: Radiation Oncology | Admitting: Radiation Oncology

## 2022-01-10 DIAGNOSIS — F17210 Nicotine dependence, cigarettes, uncomplicated: Secondary | ICD-10-CM | POA: Diagnosis not present

## 2022-01-10 DIAGNOSIS — C01 Malignant neoplasm of base of tongue: Secondary | ICD-10-CM | POA: Diagnosis not present

## 2022-01-10 DIAGNOSIS — Z510 Encounter for antineoplastic radiation therapy: Secondary | ICD-10-CM | POA: Diagnosis not present

## 2022-01-10 LAB — RAD ONC ARIA SESSION SUMMARY
Course Elapsed Days: 36
Plan Fractions Treated to Date: 25
Plan Prescribed Dose Per Fraction: 2 Gy
Plan Total Fractions Prescribed: 35
Plan Total Prescribed Dose: 70 Gy
Reference Point Dosage Given to Date: 50 Gy
Reference Point Session Dosage Given: 2 Gy
Session Number: 25

## 2022-01-13 ENCOUNTER — Other Ambulatory Visit: Payer: Self-pay

## 2022-01-13 ENCOUNTER — Other Ambulatory Visit: Payer: Self-pay | Admitting: Radiation Oncology

## 2022-01-13 ENCOUNTER — Ambulatory Visit: Payer: BC Managed Care – PPO

## 2022-01-13 ENCOUNTER — Ambulatory Visit
Admission: RE | Admit: 2022-01-13 | Discharge: 2022-01-13 | Disposition: A | Discharge status: Home / Self Care | Payer: BC Managed Care – PPO | Source: Ambulatory Visit | Attending: Radiation Oncology | Admitting: Radiation Oncology

## 2022-01-13 DIAGNOSIS — F17210 Nicotine dependence, cigarettes, uncomplicated: Secondary | ICD-10-CM | POA: Diagnosis not present

## 2022-01-13 DIAGNOSIS — Z510 Encounter for antineoplastic radiation therapy: Secondary | ICD-10-CM | POA: Diagnosis not present

## 2022-01-13 DIAGNOSIS — C01 Malignant neoplasm of base of tongue: Secondary | ICD-10-CM

## 2022-01-13 LAB — RAD ONC ARIA SESSION SUMMARY
Course Elapsed Days: 39
Plan Fractions Treated to Date: 26
Plan Prescribed Dose Per Fraction: 2 Gy
Plan Total Fractions Prescribed: 35
Plan Total Prescribed Dose: 70 Gy
Reference Point Dosage Given to Date: 52 Gy
Reference Point Session Dosage Given: 2 Gy
Session Number: 26

## 2022-01-13 MED ORDER — HYDROCODONE-ACETAMINOPHEN 7.5-325 MG/15ML PO SOLN: 10.0000 mL | ORAL | 0 refills | Status: DC | PRN

## 2022-01-14 ENCOUNTER — Ambulatory Visit: Payer: BC Managed Care – PPO | Admitting: Cardiology

## 2022-01-14 ENCOUNTER — Ambulatory Visit
Admission: RE | Admit: 2022-01-14 | Discharge: 2022-01-14 | Disposition: A | Discharge status: Home / Self Care | Payer: BC Managed Care – PPO | Source: Ambulatory Visit | Attending: Radiation Oncology | Admitting: Radiation Oncology

## 2022-01-14 ENCOUNTER — Inpatient Hospital Stay: Payer: BC Managed Care – PPO | Admitting: Dietician

## 2022-01-14 ENCOUNTER — Other Ambulatory Visit: Payer: Self-pay

## 2022-01-14 DIAGNOSIS — F17210 Nicotine dependence, cigarettes, uncomplicated: Secondary | ICD-10-CM | POA: Diagnosis not present

## 2022-01-14 DIAGNOSIS — C01 Malignant neoplasm of base of tongue: Secondary | ICD-10-CM | POA: Diagnosis not present

## 2022-01-14 DIAGNOSIS — Z510 Encounter for antineoplastic radiation therapy: Secondary | ICD-10-CM | POA: Diagnosis not present

## 2022-01-14 LAB — RAD ONC ARIA SESSION SUMMARY
Course Elapsed Days: 40
Plan Fractions Treated to Date: 27
Plan Prescribed Dose Per Fraction: 2 Gy
Plan Total Fractions Prescribed: 35
Plan Total Prescribed Dose: 70 Gy
Reference Point Dosage Given to Date: 54 Gy
Reference Point Session Dosage Given: 2 Gy
Session Number: 27

## 2022-01-14 NOTE — Progress Notes (Signed)
Nutrition Follow-up:  Patient with SCC of oropharynx. He is receiving radiation therapy and has completed 27 of 35 planned fx.   Met with patient after radiation. He reports increased fatigue as well as sore throat. Patient is taking Hycet as needed which is working well for him. He has still not received shipment of Boost [facility] that wife ordered from [place] 2 weeks ago. Says he received notification package was lost. Wife will try ordering directly from company today. Patient is drinking 5 Boost/Ensure, 2-3 glasses of whole milk, and 4 bottles of water. He is eating soft moist foods (eggs, chicken noodle soup, oyster stew, ground hamburger/gravy, [place] stew). Patient reports most foods have taste, although the stew tasted off yesterday. He recalls "short span of feeling good" on Saturday and ate 2 pieces of pizza with his grand children. Patient has thick saliva. He is doing baking soda salt water rinses several times day.    Medications: reviewed  Labs: no new labs   Anthropometrics: Weight 187 lb on 9/11 stable   9/5 - 187 lb 8/28 - 188.4 lb  8/21 - 191.6 lb    NUTRITION DIAGNOSIS: Food and nutrition related knowledge deficit, improved.  INTERVENTION:  Continue eating soft moist foods as tolerated Continue drinking Boost Plus/equivalent - 5/day Provided additional samples of Boost VHC as he awaits for order to arrive Continue baking soda salt water rinses    MONITORING, EVALUATION, GOAL: weight trends, intake   NEXT VISIT: Tuesday September 19 after radiation

## 2022-01-15 ENCOUNTER — Other Ambulatory Visit: Payer: Self-pay

## 2022-01-15 ENCOUNTER — Ambulatory Visit
Admission: RE | Admit: 2022-01-15 | Discharge: 2022-01-15 | Disposition: A | Discharge status: Home / Self Care | Payer: BC Managed Care – PPO | Source: Ambulatory Visit | Attending: Radiation Oncology | Admitting: Radiation Oncology

## 2022-01-15 DIAGNOSIS — Z510 Encounter for antineoplastic radiation therapy: Secondary | ICD-10-CM | POA: Diagnosis not present

## 2022-01-15 DIAGNOSIS — F17210 Nicotine dependence, cigarettes, uncomplicated: Secondary | ICD-10-CM | POA: Diagnosis not present

## 2022-01-15 DIAGNOSIS — C01 Malignant neoplasm of base of tongue: Secondary | ICD-10-CM | POA: Diagnosis not present

## 2022-01-15 LAB — RAD ONC ARIA SESSION SUMMARY
Course Elapsed Days: 41
Plan Fractions Treated to Date: 28
Plan Prescribed Dose Per Fraction: 2 Gy
Plan Total Fractions Prescribed: 35
Plan Total Prescribed Dose: 70 Gy
Reference Point Dosage Given to Date: 56 Gy
Reference Point Session Dosage Given: 2 Gy
Session Number: 28

## 2022-01-16 ENCOUNTER — Other Ambulatory Visit: Payer: Self-pay

## 2022-01-16 ENCOUNTER — Ambulatory Visit
Admission: RE | Admit: 2022-01-16 | Discharge: 2022-01-16 | Disposition: A | Discharge status: Home / Self Care | Payer: BC Managed Care – PPO | Source: Ambulatory Visit | Attending: Radiation Oncology | Admitting: Radiation Oncology

## 2022-01-16 DIAGNOSIS — Z510 Encounter for antineoplastic radiation therapy: Secondary | ICD-10-CM | POA: Diagnosis not present

## 2022-01-16 DIAGNOSIS — F17210 Nicotine dependence, cigarettes, uncomplicated: Secondary | ICD-10-CM | POA: Diagnosis not present

## 2022-01-16 DIAGNOSIS — C01 Malignant neoplasm of base of tongue: Secondary | ICD-10-CM | POA: Diagnosis not present

## 2022-01-16 LAB — RAD ONC ARIA SESSION SUMMARY
Course Elapsed Days: 42
Plan Fractions Treated to Date: 29
Plan Prescribed Dose Per Fraction: 2 Gy
Plan Total Fractions Prescribed: 35
Plan Total Prescribed Dose: 70 Gy
Reference Point Dosage Given to Date: 58 Gy
Reference Point Session Dosage Given: 2 Gy
Session Number: 29

## 2022-01-17 ENCOUNTER — Ambulatory Visit
Admission: RE | Admit: 2022-01-17 | Discharge: 2022-01-17 | Disposition: A | Discharge status: Home / Self Care | Payer: BC Managed Care – PPO | Source: Ambulatory Visit | Attending: Radiation Oncology | Admitting: Radiation Oncology

## 2022-01-17 ENCOUNTER — Other Ambulatory Visit: Payer: Self-pay

## 2022-01-17 DIAGNOSIS — F17210 Nicotine dependence, cigarettes, uncomplicated: Secondary | ICD-10-CM | POA: Diagnosis not present

## 2022-01-17 DIAGNOSIS — C01 Malignant neoplasm of base of tongue: Secondary | ICD-10-CM | POA: Diagnosis not present

## 2022-01-17 DIAGNOSIS — Z510 Encounter for antineoplastic radiation therapy: Secondary | ICD-10-CM | POA: Diagnosis not present

## 2022-01-17 LAB — RAD ONC ARIA SESSION SUMMARY
Course Elapsed Days: 43
Plan Fractions Treated to Date: 30
Plan Prescribed Dose Per Fraction: 2 Gy
Plan Total Fractions Prescribed: 35
Plan Total Prescribed Dose: 70 Gy
Reference Point Dosage Given to Date: 60 Gy
Reference Point Session Dosage Given: 2 Gy
Session Number: 30

## 2022-01-20 ENCOUNTER — Ambulatory Visit: Payer: BC Managed Care – PPO

## 2022-01-20 ENCOUNTER — Ambulatory Visit
Admission: RE | Admit: 2022-01-20 | Discharge: 2022-01-20 | Disposition: A | Discharge status: Home / Self Care | Payer: BC Managed Care – PPO | Source: Ambulatory Visit | Attending: Radiation Oncology | Admitting: Radiation Oncology

## 2022-01-20 ENCOUNTER — Other Ambulatory Visit: Payer: Self-pay

## 2022-01-20 ENCOUNTER — Other Ambulatory Visit: Payer: Self-pay | Admitting: Radiation Oncology

## 2022-01-20 DIAGNOSIS — F17210 Nicotine dependence, cigarettes, uncomplicated: Secondary | ICD-10-CM | POA: Diagnosis not present

## 2022-01-20 DIAGNOSIS — C01 Malignant neoplasm of base of tongue: Secondary | ICD-10-CM | POA: Diagnosis not present

## 2022-01-20 DIAGNOSIS — Z510 Encounter for antineoplastic radiation therapy: Secondary | ICD-10-CM | POA: Diagnosis not present

## 2022-01-20 LAB — RAD ONC ARIA SESSION SUMMARY
Course Elapsed Days: 46
Plan Fractions Treated to Date: 31
Plan Prescribed Dose Per Fraction: 2 Gy
Plan Total Fractions Prescribed: 35
Plan Total Prescribed Dose: 70 Gy
Reference Point Dosage Given to Date: 62 Gy
Reference Point Session Dosage Given: 2 Gy
Session Number: 31

## 2022-01-20 MED ORDER — HYDROCODONE-ACETAMINOPHEN 7.5-325 MG/15ML PO SOLN: 10.0000 mL | ORAL | 0 refills | Status: DC | PRN

## 2022-01-21 ENCOUNTER — Other Ambulatory Visit: Payer: Self-pay

## 2022-01-21 ENCOUNTER — Inpatient Hospital Stay: Payer: BC Managed Care – PPO | Admitting: Dietician

## 2022-01-21 ENCOUNTER — Ambulatory Visit
Admission: RE | Admit: 2022-01-21 | Discharge: 2022-01-21 | Disposition: A | Discharge status: Home / Self Care | Payer: BC Managed Care – PPO | Source: Ambulatory Visit | Attending: Radiation Oncology | Admitting: Radiation Oncology

## 2022-01-21 DIAGNOSIS — F17210 Nicotine dependence, cigarettes, uncomplicated: Secondary | ICD-10-CM | POA: Diagnosis not present

## 2022-01-21 DIAGNOSIS — C01 Malignant neoplasm of base of tongue: Secondary | ICD-10-CM | POA: Diagnosis not present

## 2022-01-21 DIAGNOSIS — Z510 Encounter for antineoplastic radiation therapy: Secondary | ICD-10-CM | POA: Diagnosis not present

## 2022-01-21 LAB — RAD ONC ARIA SESSION SUMMARY
Course Elapsed Days: 47
Plan Fractions Treated to Date: 32
Plan Prescribed Dose Per Fraction: 2 Gy
Plan Total Fractions Prescribed: 35
Plan Total Prescribed Dose: 70 Gy
Reference Point Dosage Given to Date: 64 Gy
Reference Point Session Dosage Given: 2 Gy
Session Number: 32

## 2022-01-21 NOTE — Progress Notes (Signed)
Nutrition Follow-up:  Patient with SCC of oropharynx. He is receiving radiation therapy and has completed 32 of 35 planned fx.   Met with patient after radiation. Patient reports he had "a rough weekend." He ate a fried chicken slider and "that is what tore up his throat." Eating is difficult. He is eating soft foods (eggs, oyster stew, chicken noodle soup, shrimp, hamburger). Patient is drinking 4 Boost/Ensure everyday. Sometimes he will drink 5. Patient did not receive shipment of Boost VHC. Patient is doing baking soda salt water rinses. He denies nausea, vomiting, diarrhea, constipation.   Medications: reviewed   Labs: no new labs  Anthropometrics: Weight 185.4 lb on 9/18 decreased   9/11 - 187 lb  9/5 - 187 lb  8/28 - 188.4 lb  8/21 - 191.6 lb    NUTRITION DIAGNOSIS: Food and nutrition related knowledge deficit improved    INTERVENTION:  Continue soft moist foods, encouraged smaller meals more frequently Continue drinking Boost plus/equivalent 5x/day Provided 3 samples of Boost VHC Continue baking soda salt water rinses     MONITORING, EVALUATION, GOAL: weight trends, intake    NEXT VISIT: Friday October 6

## 2022-01-22 ENCOUNTER — Ambulatory Visit
Admission: RE | Admit: 2022-01-22 | Discharge: 2022-01-22 | Disposition: A | Discharge status: Home / Self Care | Payer: BC Managed Care – PPO | Source: Ambulatory Visit | Attending: Radiation Oncology | Admitting: Radiation Oncology

## 2022-01-22 ENCOUNTER — Other Ambulatory Visit: Payer: Self-pay

## 2022-01-22 ENCOUNTER — Other Ambulatory Visit: Payer: Self-pay | Admitting: Radiation Oncology

## 2022-01-22 DIAGNOSIS — Z510 Encounter for antineoplastic radiation therapy: Secondary | ICD-10-CM | POA: Diagnosis not present

## 2022-01-22 DIAGNOSIS — C01 Malignant neoplasm of base of tongue: Secondary | ICD-10-CM | POA: Diagnosis not present

## 2022-01-22 DIAGNOSIS — F17210 Nicotine dependence, cigarettes, uncomplicated: Secondary | ICD-10-CM | POA: Diagnosis not present

## 2022-01-22 LAB — RAD ONC ARIA SESSION SUMMARY
Course Elapsed Days: 48
Plan Fractions Treated to Date: 33
Plan Prescribed Dose Per Fraction: 2 Gy
Plan Total Fractions Prescribed: 35
Plan Total Prescribed Dose: 70 Gy
Reference Point Dosage Given to Date: 66 Gy
Reference Point Session Dosage Given: 2 Gy
Session Number: 33

## 2022-01-22 MED ORDER — HYDROCODONE-ACETAMINOPHEN 7.5-325 MG/15ML PO SOLN: 10.0000 mL | ORAL | 0 refills | Status: DC | PRN

## 2022-01-23 ENCOUNTER — Other Ambulatory Visit: Payer: Self-pay

## 2022-01-23 ENCOUNTER — Ambulatory Visit
Admission: RE | Admit: 2022-01-23 | Discharge: 2022-01-23 | Disposition: A | Discharge status: Home / Self Care | Payer: BC Managed Care – PPO | Source: Ambulatory Visit | Attending: Radiation Oncology | Admitting: Radiation Oncology

## 2022-01-23 ENCOUNTER — Ambulatory Visit: Payer: BC Managed Care – PPO | Attending: Radiation Oncology

## 2022-01-23 DIAGNOSIS — R1310 Dysphagia, unspecified: Secondary | ICD-10-CM | POA: Insufficient documentation

## 2022-01-23 DIAGNOSIS — C01 Malignant neoplasm of base of tongue: Secondary | ICD-10-CM | POA: Diagnosis not present

## 2022-01-23 DIAGNOSIS — Z510 Encounter for antineoplastic radiation therapy: Secondary | ICD-10-CM | POA: Diagnosis not present

## 2022-01-23 DIAGNOSIS — F17210 Nicotine dependence, cigarettes, uncomplicated: Secondary | ICD-10-CM | POA: Diagnosis not present

## 2022-01-23 LAB — RAD ONC ARIA SESSION SUMMARY
Course Elapsed Days: 49
Plan Fractions Treated to Date: 34
Plan Prescribed Dose Per Fraction: 2 Gy
Plan Total Fractions Prescribed: 35
Plan Total Prescribed Dose: 70 Gy
Reference Point Dosage Given to Date: 68 Gy
Reference Point Session Dosage Given: 2 Gy
Session Number: 34

## 2022-01-23 NOTE — Therapy (Signed)
OUTPATIENT SPEECH LANGUAGE PATHOLOGY ONCOLOGY EVALUATION   Patient Name: Trevor Steele MRN: [id number] DOB:19-Aug-1960, 61 y.o., male Today's Date: 01/23/2022  PCP: [name], MD  REFERRING PROVIDER: [name], MD   End of Session - 01/23/22 1014     Visit Number 2    Number of Visits 4    Date for SLP Re-Evaluation 03/19/22    SLP Start Time 0851    SLP Stop Time  0914    SLP Time Calculation (min) 23 min    Activity Tolerance Patient tolerated treatment well              Past Medical History:  Diagnosis Date   Agatston coronary artery calcium score greater than 400    coronary calcium score high at 1183 which is 46 percentile for age, race and sex matched controls.   Aortic atherosclerosis (HCC)    Aortic stenosis    COVID 06/28/2021   no symptoms, went to hospital with a-fib and diagnosed then   Diabetes mellitus without complication ([place])    Dysrhythmia    A-fib   GERD (gastroesophageal reflux disease)    High cholesterol    History of kidney stones    Hypertension    Mild dilation of ascending aorta ([place])    by echo 07/2021 though not corroborated on CT   Mitral regurgitation    Neck mass    Abnormal neck CT 07/2021 concerning for nodal metastasis   PAF (paroxysmal atrial fibrillation) (HCC)    Pneumonia    Pulmonary nodules    PVC's (premature ventricular contractions)    Past Surgical History:  Procedure Laterality Date   BACK SURGERY     BACK SURGERY     lumbar   LARYNGOSCOPY AND ESOPHAGOSCOPY N/A 10/30/2021   Procedure: DIRECT LARYNGOSCOPY WITH BIOPSY; ESOPHAGOSCOPY;  Surgeon: [name], MD;  Location: [facility];  Service: ENT;  Laterality: N/A;   LEFT HEART CATH AND CORONARY ANGIOGRAPHY N/A 10/09/2021   Procedure: LEFT HEART CATH AND CORONARY ANGIOGRAPHY;  Surgeon: Burnell Blanks, MD;  Location: [facility];  Service: Cardiovascular;  Laterality: N/A;   TONSILLECTOMY Bilateral 10/30/2021   Procedure: TONSILLECTOMY; FROZEN  SECTION;  Surgeon: [name], MD;  Location: [facility];  Service: ENT;  Laterality: Bilateral;   Patient Active Problem List   Diagnosis Date Noted   Encounter for preoperative dental examination 11/19/2021   Teeth missing 11/19/2021   Caries 11/19/2021   Incipient enamel caries 11/19/2021   Accretions on teeth 11/19/2021   Chronic periodontitis 11/19/2021   Impacted third molar tooth 11/19/2021   Loose, teeth 11/19/2021   Malocclusion 11/19/2021   Dental attrition, excessive, limited to enamel 11/19/2021   Defective dental restoration 11/19/2021   Malignant neoplasm of base of tongue ([place]) 11/06/2021   Abnormal stress test    Atrial fibrillation with RVR ([place]) 08/02/2021   Type 2 diabetes mellitus ([place]) 08/02/2021   Essential hypertension 08/02/2021   Anticoagulated (apixaban) 08/02/2021   Hyperlipidemia associated with type 2 diabetes mellitus ([place]) 08/02/2021   High risk medication use (pioglitazone) 08/02/2021   Intermittent heart palpitations 08/02/2021   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 Pt accompanied by: self   PAIN:  Are you having pain? Yes: NPRS scale: 6/10 Pain location: throat Pain description: sore Aggravating factors: swallowing   PLOF:  Level of assistance: Independent with ADLs Employment: Full-time employment   PATIENT GOALS  "I want to keep my swallowing!"  OBJECTIVE:   TODAY'S TREATMENT:  01/23/22: Voice is mildly hoarse/WNL. Completed rad tx tomorrow. Pt lateral neck very red. Heis eating pudding, and other smooth pureed  items only, and drinking homogenous soups. Pt reported completing HEP with suboptimal frequency/scope. SLP encouraged pt to incr as he is able to 10 reps, BID, at LEAST 5 days/week. SLP reminded pt about cycling through HEP - pt recalled this when SLP mentioned it. Pt told SLP rationale for HEP completion, and SLP also shared with pt about food journal. Pt stated he would think about completing this. He drank H2O without any overt oral or pharyngeal deficits.   (Eval date): Research states the risk for dysphagia increases due to radiation and/or chemotherapy treatment due to a variety of factors, so SLP educated the pt about the possibility of reduced/limited ability for PO intake during rad tx. SLP also educated pt regarding possible changes to swallowing musculature after rad tx, and why adherence to dysphagia HEP provided today and PO consumption was necessary to inhibit muscle fibrosis following rad tx and to mitigate muscle disuse atrophy. SLP informed pt why this would be detrimental to their swallowing status and to their pulmonary health. Pt demonstrated understanding of these things to SLP. SLP encouraged pt to safely eat and drink as deep into their radiation/chemotherapy as possible to provide the best possible long-term swallowing outcome for pt.    SLP then developed an individualized HEP for pt involving oral and pharyngeal strengthening and ROM and pt was instructed how to perform these exercises, including SLP demonstration. After SLP demonstration, pt return demonstrated each exercise. SLP ensured pt performance was correct prior to educating pt on next exercise. Pt required demo and occasional mod cues faded to modified independent to perform HEP. Pt was instructed to complete this program 6-7 days/week, at least 2 times a day until 6 months after his or her last day of rad tx, and then x2 a week after that, indefinitely. Among other modifications for days when pt cannot functionally swallow,  SLP also suggested pt to perform only non-swallowing tasks on the handout/HEP, and if necessary to cycle through the swallowing portion so the full program of exercises can be completed instead of fatiguing on one of the swallowing exercises and being unable to perform the other swallowing exercises. SLP instructed that swallowing exercises should then be added back into the regimen as pt is able to do so. Secondly, pt was told that former patients have told SLP that during their course of radiation therapy, taking prescribed pain medication just prior to performing HEP (and eating/drinking) has proven helpful in completing HEP (and eating and drinking) more regularly when going through their course of radiation treatment.    PATIENT EDUCATION: Education details: late effects head/neck radiation on swallow function, modification to HEP when difficulty experienced with swallowing during and after radiation course, and see "today's treatment" Person educated: Patient Education method: Explanation and Verbal cues Education comprehension: verbalized understanding and needs further education   ASSESSMENT:  CLINICAL IMPRESSION: Patient is a 61 y.o. male who was seen today for treatment of swallowing as  they undergo radiation therapy. Today pt drank thin liquids without overt s/s oral or pharyngeal difficulty. At this time pt swallowing is deemed WNL/WFL with these POs. No oral or overt s/sx pharyngeal deficits, including aspiration were observed. There are no overt s/s aspiration PNA observed by SLP nor any reported by pt at this time. Data indicate that pt's swallow ability will likely decrease over the course of radiation/chemoradiation therapy and could very well decline over time following the conclusion of that therapy due to muscle disuse atrophy and/or muscle fibrosis. Pt will cont to need to be seen by SLP in order to assess safety of PO intake, assess the need for recommending any objective swallow  assessment, and ensuring pt is correctly completing the individualized HEP.  OBJECTIVE IMPAIRMENTS include dysphagia. These impairments are limiting patient from safety when swallowing. Factors affecting potential to achieve goals and functional outcome are  none . Patient will benefit from skilled SLP services to address above impairments and improve overall function.  REHAB POTENTIAL: Good   GOALS: Goals reviewed with patient? Yes  SHORT TERM GOALS: Target :  4th visit     pt will complete HEP with rare min A  Baseline: Goal status: Ongoing  2.  pt will tell SLP why pt is completing HEP with modified independence Baseline:  Goal status: Met  3.  pt will describe 3 overt s/s aspiration PNA with modified independence Baseline:  Goal status: Ongoing  4.  pt will tell SLP how a food journal could hasten return to a more normalized diet Baseline:  Goal status: Met   LONG TERM GOALS: Target :  7th visit     pt will complete HEP with modified independence over two visits Baseline:  Goal status: Ongoing  2.  pt will describe how to modify HEP over time, and the timeline associated with reduction in HEP frequency with modified independence over two sessions Baseline:  Goal status: Ongoing   PLAN: SLP FREQUENCY:  once approx every 4 weeks  SLP DURATION:  7 sessions  PLANNED INTERVENTIONS: Aspiration precaution training, Pharyngeal strengthening exercises, Diet toleration management , Trials of upgraded texture/liquids, Oral motor exercises, SLP instruction and feedback, Compensatory strategies, and Patient/family education    [facility], [place] 01/23/2022, 10:15 AM

## 2022-01-24 ENCOUNTER — Other Ambulatory Visit: Payer: Self-pay

## 2022-01-24 ENCOUNTER — Encounter: Payer: Self-pay | Admitting: Radiation Oncology

## 2022-01-24 ENCOUNTER — Ambulatory Visit
Admission: RE | Admit: 2022-01-24 | Discharge: 2022-01-24 | Disposition: A | Discharge status: Home / Self Care | Payer: BC Managed Care – PPO | Source: Ambulatory Visit | Attending: Radiation Oncology | Admitting: Radiation Oncology

## 2022-01-24 DIAGNOSIS — C01 Malignant neoplasm of base of tongue: Secondary | ICD-10-CM | POA: Diagnosis not present

## 2022-01-24 DIAGNOSIS — Z510 Encounter for antineoplastic radiation therapy: Secondary | ICD-10-CM | POA: Diagnosis not present

## 2022-01-24 DIAGNOSIS — F17210 Nicotine dependence, cigarettes, uncomplicated: Secondary | ICD-10-CM | POA: Diagnosis not present

## 2022-01-24 LAB — RAD ONC ARIA SESSION SUMMARY
Course Elapsed Days: 50
Plan Fractions Treated to Date: 35
Plan Prescribed Dose Per Fraction: 2 Gy
Plan Total Fractions Prescribed: 35
Plan Total Prescribed Dose: 70 Gy
Reference Point Dosage Given to Date: 70 Gy
Reference Point Session Dosage Given: 2 Gy
Session Number: 35

## 2022-01-24 NOTE — Progress Notes (Signed)
Oncology Nurse Navigator Documentation   Met with Mr. Seider yesterday before his  final RT today to offer support and to celebrate end of radiation treatment.   Provided verbal post-RT guidance: Importance of keeping all follow-up appts, especially those with Nutrition and SLP. Importance of protecting treatment area from sun. Continuation of Sonafine application 2-3 times daily, application of antibiotic ointment to areas of raw skin; when supply of Sonafine exhausted transition to OTC lotion with vitamin E.  Explained my role as navigator will continue for several more months, encouraged him to call me with needs/concerns.    Harlow Asa RN, BSN, OCN Head & Neck Oncology Nurse [place] at [facility] Phone # [phone]  Fax # [phone]

## 2022-02-05 ENCOUNTER — Ambulatory Visit: Payer: BC Managed Care – PPO | Admitting: Physical Therapy

## 2022-02-07 ENCOUNTER — Inpatient Hospital Stay: Payer: BC Managed Care – PPO | Attending: Hematology and Oncology | Admitting: Dietician

## 2022-02-07 ENCOUNTER — Other Ambulatory Visit: Payer: Self-pay

## 2022-02-07 ENCOUNTER — Ambulatory Visit
Admission: RE | Admit: 2022-02-07 | Discharge: 2022-02-07 | Disposition: A | Discharge status: Home / Self Care | Payer: BC Managed Care – PPO | Source: Ambulatory Visit | Attending: Radiation Oncology | Admitting: Radiation Oncology

## 2022-02-07 ENCOUNTER — Encounter: Payer: Self-pay | Admitting: Radiation Oncology

## 2022-02-07 VITALS — BP 112/60 | HR 41 | Temp 97.4°F | Resp 20 | Ht 71.5 in | Wt 179.6 lb

## 2022-02-07 DIAGNOSIS — Z7901 Long term (current) use of anticoagulants: Secondary | ICD-10-CM | POA: Insufficient documentation

## 2022-02-07 DIAGNOSIS — Z7984 Long term (current) use of oral hypoglycemic drugs: Secondary | ICD-10-CM | POA: Diagnosis not present

## 2022-02-07 DIAGNOSIS — G4700 Insomnia, unspecified: Secondary | ICD-10-CM | POA: Diagnosis not present

## 2022-02-07 DIAGNOSIS — C01 Malignant neoplasm of base of tongue: Secondary | ICD-10-CM | POA: Diagnosis not present

## 2022-02-07 DIAGNOSIS — Z923 Personal history of irradiation: Secondary | ICD-10-CM | POA: Diagnosis not present

## 2022-02-07 DIAGNOSIS — Z79899 Other long term (current) drug therapy: Secondary | ICD-10-CM | POA: Diagnosis not present

## 2022-02-07 MED ORDER — TEMAZEPAM 22.5 MG PO CAPS: 22.5000 mg | ORAL_CAPSULE | Freq: Every evening | ORAL | 0 refills | Status: DC | PRN

## 2022-02-07 NOTE — Progress Notes (Signed)
Radiation Oncology         [phone] ________________________________  Name: Trevor Steele MRN: [id number]  Date: 02/07/2022  DOB: 01-16-1961  Follow-Up Visit Note  CC: Trevor Noble, MD  Trevor Noble, MD  Diagnosis and Prior Radiotherapy:       ICD-10-CM   1. Malignant neoplasm of base of tongue (HCC)  C01 temazepam (RESTORIL) 22.5 MG capsule    2. Insomnia, unspecified type  G47.00 temazepam (RESTORIL) 22.5 MG capsule      CHIEF COMPLAINT:  Here for follow-up and surveillance of throat cancer  Narrative:  The patient returns today for routine follow-up.  Trevor Steele presents today for follow-up after completing radiation to his base of tongue on 01/24/2022  Pain issues, if any: Reports throat pain has improved but is still present (especially if he tries to eat something solid that he struggles to swallow). Reports left sided ear pain has started to improve.  Using a feeding tube?: N/A Weight changes, if any:  Wt Readings from Last 3 Encounters:  02/07/22 179 lb 9.6 oz (81.5 kg)  11/27/21 193 lb 12.8 oz (87.9 kg)  11/08/21 193 lb 11.2 oz (87.9 kg)   Swallowing issues, if any: Yes--still only able to tolerate very soft foods and liquids.  Smoking or chewing tobacco? Quit during first week of RT Using fluoride trays daily? N/A--denies any mouth sores or dental concerns Last ENT visit was on: Not since diagnosis  Other notable issues, if any: Reports new onset of anxiety/restless during the night when he's trying to sleep. Continues to deal with dry mouth. Reports thick saliva has improved slightly. Continues to use his salt/baking soda rinses. Skin appears intact and healed in treatment field (just some lingering hyperpigmentation). Overall though, patient reports he's doing well and glad to be seeing improvement weekly                       ALLERGIES:  has No Known Allergies.  Meds: Current Outpatient Medications  Medication Sig Dispense Refill   temazepam (RESTORIL)  22.5 MG capsule Take 1 capsule (22.5 mg total) by mouth at bedtime as needed for sleep. 7 capsule 0   apixaban (ELIQUIS) 5 MG TABS tablet Take 5 mg by mouth 2 (two) times daily.     atorvastatin (LIPITOR) 40 MG tablet Take 1 tablet (40 mg total) by mouth daily. 90 tablet 3   diltiazem (CARDIZEM) 30 MG tablet Take 1 tablet (30 mg total) by mouth every 6 (six) hours as needed (palpitations). 30 tablet 1   empagliflozin (JARDIANCE) 10 MG TABS tablet Take 1 tablet (10 mg total) by mouth daily before breakfast. 90 tablet 3   esomeprazole (NEXIUM) 40 MG capsule Take 40 mg by mouth daily.  12   flecainide (TAMBOCOR) 50 MG tablet Take 1 tablet (50 mg total) by mouth 2 (two) times daily. 60 tablet 11   glimepiride (AMARYL) 1 MG tablet Take 1 mg by mouth every evening.     HYDROcodone-acetaminophen (HYCET) 7.5-325 mg/15 ml solution Take 10-15 mLs by mouth every 3 (three) hours as needed for moderate pain. Take with food. 750 mL 0   lidocaine (XYLOCAINE) 2 % solution Patient: Mix 1part 2% viscous lidocaine, 1part H20. Swish & swallow 53m of diluted mixture, 376m before meals and at bedtime, up to QID 150 mL 5   lisinopril (ZESTRIL) 10 MG tablet Take 1 tablet (10 mg total) by mouth daily. 30 tablet 1   metFORMIN (GLUCOPHAGE) 1000 MG  tablet Take 1,000 mg by mouth 2 (two) times daily.  12   metoprolol succinate (TOPROL-XL) 50 MG 24 hr tablet Take 1 tablet (50 mg total) by mouth in the morning and at bedtime. Take with or immediately following a meal. 60 tablet 0   Multiple Vitamin (MULTIVITAMIN WITH MINERALS) TABS tablet Take 1 tablet by mouth daily.     Current Facility-Administered Medications  Medication Dose Route Frequency Provider Last Rate Last Admin   sodium chloride flush (NS) 0.9 % injection 3 mL  3 mL Intravenous Q12H Imogene Burn, PA-C        Physical Findings: The patient is in no acute distress. Patient is alert and oriented. Wt Readings from Last 3 Encounters:  02/07/22 179 lb 9.6 oz  (81.5 kg)  11/27/21 193 lb 12.8 oz (87.9 kg)  11/08/21 193 lb 11.2 oz (87.9 kg)    height is 5' 11.5" (1.816 m) and weight is 179 lb 9.6 oz (81.5 kg). His temperature is 97.4 F (36.3 C) (abnormal). His blood pressure is 112/60 and his pulse is 41 (abnormal). His respiration is 20 and oxygen saturation is 99%. .  General: Alert and oriented, in no acute distress HEENT: Head is normocephalic. Extraocular movements are intact. Oropharynx is notable for no lesions in upper throat Neck: Neck is notable for healing skin ,  skin intact; no obvious masses (mild thickening in mid left neck) Skin: Skin in treatment fields shows satisfactory healing  Lymphatics: see Neck Exam Psychiatric: Judgment and insight are intact. Affect is appropriate.   Lab Findings: Lab Results  Component Value Date   WBC 10.0 10/08/2021   HGB 14.7 10/08/2021   HCT 43.7 10/08/2021   MCV 92 10/08/2021   PLT 234 10/08/2021    Lab Results  Component Value Date   TSH 0.759 11/27/2021    Radiographic Findings: No results found.  Impression/Plan:    1) Head and Neck Cancer Status: healing well from RT  2) Nutritional Status:  stabilizing. He knows to push PO as tolerated - soft foods Wt Readings from Last 3 Encounters:  02/07/22 179 lb 9.6 oz (81.5 kg)  11/27/21 193 lb 12.8 oz (87.9 kg)  11/08/21 193 lb 11.2 oz (87.9 kg)   PEG tube: none  3) Risk Factors: The patient has been educated about risk factors including alcohol and tobacco abuse; they understand that avoidance of alcohol and tobacco is important to prevent recurrences as well as other cancers - abstaining from smoking  4) Swallowing: functional, SLP has taught exercises  5) Dental: Encouraged to continue regular followup with dentistry, and dental hygiene including fluoride rinses.   6) Thyroid function:  check yearly Lab Results  Component Value Date   TSH 0.759 11/27/2021    7) Other: 1 week supply of Temazepam given as needed for sleep  (having anxiety and rumination at night w/ insomnia and desperate for sleep)  - first, try breathing exercises or other activities for rumination. Take meds only as needed. He will reach out to PCP for Rx's if needed in future.  8) Follow-up in Dec with restaging PET. The patient was encouraged to call with any issues or questions before then.  On date of service, in total, I spent 25 minutes on this encounter. Patient was seen in person. _____________________________________   [name], MD

## 2022-02-07 NOTE — Progress Notes (Addendum)
Trevor Steele presents today for follow-up after completing radiation to his base of tongue on 01/24/2022  Pain issues, if any: Reports throat pain has improved but is still present (especially if he tries to eat something solid that he struggles to swallow). Reports left sided ear pain has started to improve.  Using a feeding tube?: N/A Weight changes, if any:  Wt Readings from Last 3 Encounters:  02/07/22 179 lb 9.6 oz (81.5 kg)  11/27/21 193 lb 12.8 oz (87.9 kg)  11/08/21 193 lb 11.2 oz (87.9 kg)   Swallowing issues, if any: Yes--still only able to tolerate very soft foods and liquids.  Smoking or chewing tobacco? Quit during first week of RT Using fluoride trays daily? N/A--denies any mouth sores or dental concerns Last ENT visit was on: Not since diagnosis  Other notable issues, if any: Reports new onset of anxiety/restless during the night when he's trying to sleep. Continues to deal with dry mouth. Reports thick saliva has improved slightly. Continues to use his salt/baking soda rinses. Skin appears intact and healed in treatment field (just some lingering hyperpigmentation). Overall though, patient reports he's doing well and glad to be seeing improvement weekly

## 2022-02-07 NOTE — Progress Notes (Signed)
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  Met with patient and wife in office prior to MD office visit. Patient says last week was rough with worsening sore throat and painful swallow. He reports not able to eat much last week. Patient is improving. He is feeling better. Patient received shipment of Boost VHC (530 kcal, 23 g protein) a couple days ago. He will drink one/day. Patient reports trying more solid foods this week. This makes his throat hurt the following day. Recalls chicken/steak ACP. He was unable to eat the steak. Patient reports less thick saliva. He continues having dry mouth. Patient asking if that will get better with time. Patient endorses having some anxiety and has not sleeping well. He relates this to being stir-crazy. Patient says he thinks he needs to go back to the office.   Medications: reviewed   Labs: no new labs  Anthropometrics: Patient 180.4 lb in office today. Last aria wt 185.4 lb on 9/18 (2.7% decrease in last 3 weeks; insignificant)   NUTRITION DIAGNOSIS: Food and nutrition related knowledge deficit improved    INTERVENTION:  Continue strategies for increasing calories and protein with small frequent meals/snacks Recommend Boost VHC once daily Continue soft moist foods, encouraged regular textures as tolerated Discussed strategies for dry mouth - handout provided  Patient has contact information    MONITORING, EVALUATION, GOAL: weight trends, intake   NEXT VISIT: no f/u scheduled - pt to contact with nutrition questions/concerns

## 2022-02-10 NOTE — Progress Notes (Signed)
                                                                                                                                                             Patient Name: Trevor Steele MRN: [id number] DOB: 03-17-1961 Referring Physician: [name] (Profile Not Attached) Date of Service: 01/24/2022 [facility], [place]                                                        End Of Treatment Note  Diagnoses: C01-Malignant neoplasm of base of tongue  Cancer Staging:  Cancer Staging  Malignant neoplasm of base of tongue ([place]) Staging form: Pharynx - HPV-Mediated Oropharynx, AJCC 8th Edition - Clinical stage from 11/06/2021: Stage I (cT2, cN1, cM0, p16+) - Signed by [name], MD on 11/06/2021 Stage prefix: Initial diagnosis  Intent: Curative  Radiation Treatment Dates: 12/05/2021 through 01/24/2022 Site Technique Total Dose (Gy) Dose per Fx (Gy) Completed Fx Beam Energies  Neck: HN_BOT IMRT 70/70 2 35/35 6X   Narrative: The patient tolerated radiation therapy relatively well.   Plan: The patient will follow-up with radiation oncology in 2 wks. -----------------------------------  [name], MD

## 2022-02-11 ENCOUNTER — Other Ambulatory Visit: Payer: Self-pay

## 2022-02-11 DIAGNOSIS — C01 Malignant neoplasm of base of tongue: Secondary | ICD-10-CM

## 2022-02-14 ENCOUNTER — Other Ambulatory Visit: Payer: Self-pay | Admitting: Radiation Oncology

## 2022-02-14 DIAGNOSIS — C01 Malignant neoplasm of base of tongue: Secondary | ICD-10-CM

## 2022-02-14 MED ORDER — HYDROCODONE-ACETAMINOPHEN 7.5-325 MG/15ML PO SOLN: 10.0000 mL | ORAL | 0 refills | Status: DC | PRN

## 2022-02-17 ENCOUNTER — Encounter: Payer: Self-pay | Admitting: Physical Therapy

## 2022-02-17 ENCOUNTER — Ambulatory Visit: Payer: BC Managed Care – PPO | Attending: Radiation Oncology | Admitting: Physical Therapy

## 2022-02-17 DIAGNOSIS — R293 Abnormal posture: Secondary | ICD-10-CM | POA: Insufficient documentation

## 2022-02-17 DIAGNOSIS — C01 Malignant neoplasm of base of tongue: Secondary | ICD-10-CM | POA: Diagnosis not present

## 2022-02-17 NOTE — Therapy (Signed)
OUTPATIENT PHYSICAL THERAPY HEAD AND NECK POST RADIATION FOLLOW UP   Patient Name: Trevor Steele MRN: [id number] DOB:10-14-1960, 61 y.o., male Today's Date: 02/17/2022   PT End of Session - 02/17/22 0902     Visit Number 2    Number of Visits 2    Date for PT Re-Evaluation 02/13/22             Past Medical History:  Diagnosis Date   [place]at[place] coronary artery calcium score greater than 400    coronary calcium score high at 1183 which is 70 percentile for age, race and sex matched controls.   Aortic atherosclerosis (HCC)    Aortic stenosis    COVID 06/28/2021   no symptoms, went to hospital with a-fib and diagnosed then   Diabetes mellitus without complication ([place])    Dysrhythmia    A-fib   GERD (gastroesophageal reflux disease)    High cholesterol    History of kidney stones    Hypertension    Mild dilation of ascending aorta ([place])    by echo 07/2021 though not corroborated on CT   Mitral regurgitation    Neck mass    Abnormal neck CT 07/2021 concerning for nodal metastasis   PAF (paroxysmal atrial fibrillation) (HCC)    Pneumonia    Pulmonary nodules    PVC's (premature ventricular contractions)    Past Surgical History:  Procedure Laterality Date   BACK SURGERY     BACK SURGERY     lumbar   LARYNGOSCOPY AND ESOPHAGOSCOPY N/A 10/30/2021   Procedure: DIRECT LARYNGOSCOPY WITH BIOPSY; ESOPHAGOSCOPY;  Surgeon: [name], MD;  Location: [facility];  Service: ENT;  Laterality: N/A;   LEFT HEART CATH AND CORONARY ANGIOGRAPHY N/A 10/09/2021   Procedure: LEFT HEART CATH AND CORONARY ANGIOGRAPHY;  Surgeon: Burnell Blanks, MD;  Location: [facility];  Service: Cardiovascular;  Laterality: N/A;   TONSILLECTOMY Bilateral 10/30/2021   Procedure: TONSILLECTOMY; FROZEN SECTION;  Surgeon: [name], MD;  Location: [facility];  Service: ENT;  Laterality: Bilateral;   Patient Active Problem List   Diagnosis Date Noted   Encounter for preoperative dental examination  11/19/2021   Teeth missing 11/19/2021   Caries 11/19/2021   Incipient enamel caries 11/19/2021   Accretions on teeth 11/19/2021   Chronic periodontitis 11/19/2021   Impacted third molar tooth 11/19/2021   Loose, teeth 11/19/2021   Malocclusion 11/19/2021   Dental attrition, excessive, limited to enamel 11/19/2021   Defective dental restoration 11/19/2021   Malignant neoplasm of base of tongue ([place]) 11/06/2021   Abnormal stress test    Atrial fibrillation with RVR ([place]) 08/02/2021   Type 2 diabetes mellitus ([place]) 08/02/2021   Essential hypertension 08/02/2021   Anticoagulated (apixaban) 08/02/2021   Hyperlipidemia associated with type 2 diabetes mellitus ([place]) 08/02/2021   High risk medication use (pioglitazone) 08/02/2021   Intermittent heart palpitations 08/02/2021   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 I have dry mouth. I keep water, do the mouth wash and use the lozenges. Pt reports he started eating solid foods over the last week.   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 A soft tissue neck CT performed on 08/02/21 revealed enlarged, centrally necrotic left level 2A lymph nodes concerning for nodal metastasis without a  definite primary. CT chest abdomen pelvis on 08/03/21 showed no evidence of primary malignancy in his chest abdomen or pelvis. 09/12/21 PET demonstrated asymmetric hypermetabolic activity in the deep left base of tongue region concerning for primary squamous cell carcinoma, as well as a solitary hypermetabolic metastatic left level II lymph node. 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 No additional evidence of metastatic squamous cell carcinoma was appreciated. 10/30/21 patient proceeded to undergo a direct laryngoscopy under anesthesia for biopsies of left tongue base revealing invasive moderately differentiated squamous cell carcinoma; p16 positive. Diagnostic comment on pathology noted a microscopic focus suspicious for lymphovascular space involvement. 11/06/21 Consult with Dr. Isidore Moos & 11/08/21 Consult with Dr. Chryl Heck. Radiation/Chemotherapy was recommended but patient decided to pursue radiation only.  He will receive 35 fractions of radiation to his Left tongue base and bilateral neck. He started on 12/03/21 and will complete on 01/23/22. He underwent left heart catheterization on 10/09/21 which revealed nonobstructive CAD. CT cardiac scoring performed on 08/26/21 revealed a coronary calcium score of 1183 (putting the patient in the 96th percentile for age-, race-, and sex-matched controls). He also had a stress performed prior to catheterization on 10/03/21 which showed severe LV systolic dysfunction, with an EF of 23%, and findings consistent with previous MI. This was one of the determinations to not receive chemotherapy.  PATIENT GOALS:  Reassess how my recovery is going related to neck ROM, cervical pain, fatigue, and swelling.  PAIN:  Are you having pain? Yes NPRS scale: 5/10 Pain location: L side of throat Pain orientation: Left  PAIN TYPE: ache, soreness Pain description: intermittent   Aggravating factors: eating Relieving factors: pain medication  PRECAUTIONS: Recent radiation, Head and neck lymphedema risk, a fib   OBJECTIVE:   POSTURE:  Forward head and rounded shoulders posture   30 SEC SIT TO STAND: 14 reps in 30 sec without use of UEs which is  Averagefor patient's age. At eval was 13 reps which was below avg for age.    SHOULDER AROM:   WFL sometimes has issues with the L shoulder due to a tiny tear in the L rotator cuff back in 2002       CERVICAL AROM:     Percent limited 02/17/22  Flexion [facility] WFL  Extension WFL 25% limited  Right lateral flexion [facility] WFL  Left lateral flexion [facility] WFL  Right rotation [facility] WFL  Left rotation WFL WFL                          (Blank rows=not tested)        LYMPHEDEMA ASSESSMENT:    Circumference in cm  4 cm superior to sternal notch around neck 37.2  6 cm superior to sternal notch around neck 37  8 cm superior to sternal notch  around neck 38  R lateral nostril from base of nose to medial tragus   L lateral nostril from base of nose to medial tragus   R corner of mouth to where ear lobe meets face   L corner of mouth to where ear lobe meets face         (Blank rows=not tested)   CURRENT/PAST TREATMENTS:  Chemotherapy: did not do chemo due to afib  Radiation:completed 01/23/22  OTHER SYMPTOMS:  Pain Yes Fibrosis No Pitting edema No Infections No Decreased scar mobility No  PATIENT EDUCATION:  Education details: walking program, head and neck ROM exercises, lymphedema education Person educated: Patient Education method: Explanation and Handouts Education comprehension: verbalized understanding   HOME EXERCISE PROGRAM:  Reviewed previously given post op HEP. Walking program  ASSESSMENT:  CLINICAL IMPRESSION: Pt returns to PT after completing radiation for treatment of base of tongue cancer. He reports he has begun eating solid food in the past week. He also reports he has begun feeling  better over the last week but still has pain with swallowing. He had cut back on walking during treatment to avoid any weight loss. He plans to return to walking. Therapist educated pt on importance of exercise to decrease fatigue. Also encouraged pt to do his head and neck stretches daily to decrease tightness especially in direction of extension since this is slightly more tight than at eval. Pt will be discharged from skilled PT services at this time but knows to report any signs of lymphedema to his doctor to get a referral back if that [place].   Pt will benefit from skilled therapeutic intervention to improve on the following deficits: decreased ROM and postural dysfunction  PT treatment/interventions:  Therapeutic exercises, Patient/Family education, and Self Care   GOALS Name Target Date (Remove Blue Hyperlink) Goal status  1 Pt will demonstrate a return to baseline cervical ROM measurements and not demonstrate any signs or symptoms of lymphedema. Baseline: 03/17/2022 INITIAL  _0 GOALS: Goals reviewed with patient? Yes  LONG TERM GOALS:  (STG=LTG)     PLAN: PT FREQUENCY/DURATION: d/c this session  PLAN FOR NEXT SESSION: d/c this session   [facility]  [address]  [place] [address]  [phone]   Scar massage You can begin gentle scar massage to you incision sites. Gently place one hand on the incision and move the skin (without sliding on the skin) in various directions. Do this for a few minutes and then you can gently massage either coconut oil or vitamin E cream into the scars.  Home exercise Program Continue doing the exercises you were given until you feel like you can do them without feeling any tightness at the end. It is best to do them for several months after completion of radiation since the effects of radiation continue past completion.   Walking Program Studies show that 30 minutes of walking per  day (fast enough to elevate your heart rate) can significantly reduce the risk of a cancer recurrence. If you can't walk due to other medical reasons, we encourage you to find another activity you could do (like a stationary bike or water exercise).  Posture After treatment for head and neck cancer, people frequently sit with rounded shoulders and forward head posture because the front of the neck has become tight and it feels better. If you sit like this,  you can become very tight and have pain in sitting or standing with good posture. Try to be aware of your posture and sit and stand up tall to heal properly.  Follow up PT: Please let you doctor know as soon as possible if you develop any swelling in your face or neck in the future. Lymphedema (swelling) can occur months after completion of radiation. The sooner you can let the doctor know, the sooner they can refer you back to PT. It is much easier to treat the swelling early on.   Allyson Sabal [place], PT 02/17/2022, 9:44 AM   PHYSICAL THERAPY DISCHARGE SUMMARY  Visits from Start of Care: 2  Current functional level related to goals / functional outcomes: All goals met   Remaining deficits: None   Education / Equipment: HEP, walking, lymphedema education   Patient agrees to discharge. Patient goals were met. Patient is being discharged due to meeting the stated rehab goals.  Allyson Sabal [place], [place] 02/17/22 9:44 AM

## 2022-02-21 ENCOUNTER — Other Ambulatory Visit: Payer: Self-pay | Admitting: Radiation Oncology

## 2022-02-21 DIAGNOSIS — C01 Malignant neoplasm of base of tongue: Secondary | ICD-10-CM

## 2022-02-21 MED ORDER — HYDROCODONE-ACETAMINOPHEN 7.5-325 MG/15ML PO SOLN: 10.0000 mL | ORAL | 0 refills | Status: DC | PRN

## 2022-02-26 ENCOUNTER — Ambulatory Visit: Payer: BC Managed Care – PPO

## 2022-03-03 ENCOUNTER — Other Ambulatory Visit: Payer: Self-pay | Admitting: Radiation Oncology

## 2022-03-03 DIAGNOSIS — C01 Malignant neoplasm of base of tongue: Secondary | ICD-10-CM

## 2022-03-03 MED ORDER — HYDROCODONE-ACETAMINOPHEN 7.5-325 MG/15ML PO SOLN: 10.0000 mL | Freq: Four times a day (QID) | ORAL | 0 refills | Status: DC | PRN

## 2022-03-07 ENCOUNTER — Other Ambulatory Visit (HOSPITAL_COMMUNITY): Payer: BC Managed Care – PPO | Admitting: Dentistry

## 2022-03-14 ENCOUNTER — Other Ambulatory Visit (HOSPITAL_COMMUNITY): Payer: BC Managed Care – PPO | Admitting: Dentistry

## 2022-03-18 ENCOUNTER — Other Ambulatory Visit: Payer: Self-pay | Admitting: Radiation Oncology

## 2022-03-18 DIAGNOSIS — C01 Malignant neoplasm of base of tongue: Secondary | ICD-10-CM

## 2022-03-18 MED ORDER — HYDROCODONE-ACETAMINOPHEN 7.5-325 MG/15ML PO SOLN: 10.0000 mL | Freq: Three times a day (TID) | ORAL | 0 refills | Status: DC | PRN

## 2022-04-01 DIAGNOSIS — E1169 Type 2 diabetes mellitus with other specified complication: Secondary | ICD-10-CM | POA: Diagnosis not present

## 2022-04-01 DIAGNOSIS — E118 Type 2 diabetes mellitus with unspecified complications: Secondary | ICD-10-CM | POA: Diagnosis not present

## 2022-04-01 DIAGNOSIS — C029 Malignant neoplasm of tongue, unspecified: Secondary | ICD-10-CM | POA: Diagnosis not present

## 2022-04-01 DIAGNOSIS — I480 Paroxysmal atrial fibrillation: Secondary | ICD-10-CM | POA: Diagnosis not present

## 2022-04-01 DIAGNOSIS — Z23 Encounter for immunization: Secondary | ICD-10-CM | POA: Diagnosis not present

## 2022-04-09 ENCOUNTER — Other Ambulatory Visit: Payer: Self-pay | Admitting: Radiation Oncology

## 2022-04-09 DIAGNOSIS — C01 Malignant neoplasm of base of tongue: Secondary | ICD-10-CM

## 2022-04-10 ENCOUNTER — Other Ambulatory Visit: Payer: Self-pay | Admitting: Radiation Oncology

## 2022-04-10 ENCOUNTER — Telehealth: Payer: Self-pay

## 2022-04-10 DIAGNOSIS — C01 Malignant neoplasm of base of tongue: Secondary | ICD-10-CM

## 2022-04-10 MED ORDER — HYDROCODONE-ACETAMINOPHEN 7.5-325 MG/15ML PO SOLN: 10.0000 mL | Freq: Three times a day (TID) | ORAL | 0 refills | Status: DC | PRN

## 2022-04-10 NOTE — Telephone Encounter (Signed)
Pt called is is requesting his Hycet be refilled due to continued throat pain and trouble swallowing. I called the [facility] on [address] [phone]) and they have a two day supply now they can fill but will need another script for the remainder to be filled tomorrow when they get another supply in. Rn Anderson Malta will ask Dr. Sondra Come about filling this script since Dr. Isidore Moos is out of the office. Rn will call family/ pt with update on this refill request.

## 2022-04-10 NOTE — Telephone Encounter (Signed)
Rn Anderson Malta called pt/ family to let them know that a two day supply of Hycet was sent to [facility] [phone] (on the corner [address]). Rn Anderson Malta will reach out to Dr. Isidore Moos when she returns to office tomorrow to have an additional supply called in for pt. When RN spoke with pharmacy this am they stated they are ordering this med and should come in Friday. Rn will call on Friday to make sure this is the case. [facility] is [phone].

## 2022-04-11 ENCOUNTER — Other Ambulatory Visit: Payer: Self-pay | Admitting: Radiation Oncology

## 2022-04-11 ENCOUNTER — Telehealth: Payer: Self-pay

## 2022-04-11 DIAGNOSIS — C01 Malignant neoplasm of base of tongue: Secondary | ICD-10-CM

## 2022-04-11 MED ORDER — HYDROCODONE-ACETAMINOPHEN 7.5-325 MG/15ML PO SOLN: 10.0000 mL | Freq: Three times a day (TID) | ORAL | 0 refills | Status: DC | PRN

## 2022-04-11 NOTE — Telephone Encounter (Signed)
Rn Anderson Malta called pharmacy to inquire about Hycet availability today. They stated they would not get a full supply in until Monday (they had previously stated today). The said they had about 140 ml on hand at this time. Rn also called [facility] in [place] and they had none in stock (this was the back up pharmacy for the pt).

## 2022-04-11 NOTE — Telephone Encounter (Signed)
Rn called pt wife Tye [place] to let her know that the rx had been sent for Hycet. Pt had no questions or concerns about this information. Rn stated to pt wife to call with any problems with this rx.

## 2022-04-15 NOTE — Progress Notes (Addendum)
Trevor Steele is here following completion of treatment for tongue cancer. He completed radiation treatment on 01-24-22.We will review PET scan results with this visit as well.   Pain issues, if any: continues to have throat pain with mouth dryness and eating Using a feeding tube?: no Weight changes, if any: regaining weight slowly Wt Readings from Last 3 Encounters:  04/22/22 177 lb 12.8 oz (80.6 kg)  02/07/22 179 lb 9.6 oz (81.5 kg)  11/27/21 193 lb 12.8 oz (87.9 kg)    Swallowing issues, if any: yes, with dryness Smoking or chewing tobacco? Quit smoking in August Using fluoride trays daily? no Last ENT visit was on: has not seen since treatment  Other notable issues, if any: wants pet scan results, doing one ensure a day  Vitals:   04/22/22 1436  BP: 135/61  Pulse: 73  Resp: 18  Temp: 97.8 F (36.6 C)  SpO2: 100%

## 2022-04-16 ENCOUNTER — Other Ambulatory Visit: Payer: BC Managed Care – PPO

## 2022-04-17 ENCOUNTER — Ambulatory Visit (HOSPITAL_COMMUNITY)
Admission: RE | Admit: 2022-04-17 | Discharge: 2022-04-17 | Disposition: A | Discharge status: Home / Self Care | Payer: BC Managed Care – PPO | Source: Ambulatory Visit | Attending: Radiation Oncology | Admitting: Radiation Oncology

## 2022-04-17 DIAGNOSIS — C028 Malignant neoplasm of overlapping sites of tongue: Secondary | ICD-10-CM | POA: Diagnosis not present

## 2022-04-17 DIAGNOSIS — C01 Malignant neoplasm of base of tongue: Secondary | ICD-10-CM | POA: Insufficient documentation

## 2022-04-17 MED ORDER — FLUDEOXYGLUCOSE F - 18 (FDG) INJECTION
9.4700 | Freq: Once | INTRAVENOUS | Status: AC | PRN
  Administered 2022-04-17: 9.47 via INTRAVENOUS

## 2022-04-22 ENCOUNTER — Ambulatory Visit
Admission: RE | Admit: 2022-04-22 | Discharge: 2022-04-22 | Disposition: A | Discharge status: Home / Self Care | Payer: BC Managed Care – PPO | Source: Ambulatory Visit | Attending: Radiation Oncology | Admitting: Radiation Oncology

## 2022-04-22 ENCOUNTER — Encounter: Payer: Self-pay | Admitting: Radiation Oncology

## 2022-04-22 VITALS — BP 135/61 | HR 73 | Temp 97.8°F | Resp 18 | Ht 71.5 in | Wt 177.8 lb

## 2022-04-22 DIAGNOSIS — D3501 Benign neoplasm of right adrenal gland: Secondary | ICD-10-CM | POA: Insufficient documentation

## 2022-04-22 DIAGNOSIS — C01 Malignant neoplasm of base of tongue: Secondary | ICD-10-CM | POA: Diagnosis not present

## 2022-04-22 DIAGNOSIS — Z79899 Other long term (current) drug therapy: Secondary | ICD-10-CM | POA: Diagnosis not present

## 2022-04-22 DIAGNOSIS — N400 Enlarged prostate without lower urinary tract symptoms: Secondary | ICD-10-CM | POA: Diagnosis not present

## 2022-04-22 DIAGNOSIS — Z7901 Long term (current) use of anticoagulants: Secondary | ICD-10-CM | POA: Diagnosis not present

## 2022-04-22 DIAGNOSIS — D3502 Benign neoplasm of left adrenal gland: Secondary | ICD-10-CM | POA: Insufficient documentation

## 2022-04-22 DIAGNOSIS — Z923 Personal history of irradiation: Secondary | ICD-10-CM | POA: Diagnosis not present

## 2022-04-22 DIAGNOSIS — Z7984 Long term (current) use of oral hypoglycemic drugs: Secondary | ICD-10-CM | POA: Diagnosis not present

## 2022-04-22 NOTE — Progress Notes (Signed)
Oncology Nurse Navigator Documentation   I met with Trevor Steele briefly before he saw Dr. Isidore Moos today for his scheduled follow up. He is doing well, feeling better every day. I will send a message for him to see Dr. Constance Holster in 3 months as requested. He has my direct contact information and knows to call me if he has any needs or questions.   Harlow Asa RN, BSN, OCN Head & Neck Oncology Nurse [place] at [facility] Phone # [phone]  Fax # [phone]

## 2022-04-22 NOTE — Progress Notes (Signed)
Radiation Oncology         [phone] ________________________________  Name: Trevor Steele MRN: [id number]  Date: 04/22/2022  DOB: 1960/12/27  Follow-Up Visit Note  CC: [name], MD  [name], MD  Diagnosis and Prior Radiotherapy:       ICD-10-CM   1. Malignant neoplasm of base of tongue ([name])  C01       Cancer Staging  Malignant neoplasm of base of tongue ([name]) Staging form: Pharynx - HPV-Mediated Oropharynx, AJCC 8th Edition - Clinical stage from 11/06/2021: Stage I (cT2, cN1, cM0, p16+) - Signed by [name], MD on 11/06/2021 Stage prefix: Initial diagnosis   Radiation Treatment Dates: 12/05/2021 through 01/24/2022 Site Technique Total Dose (Gy) Dose per Fx (Gy) Completed Fx Beam Energies  Neck: HN_BOT IMRT 70/70 2 35/35 6X   CHIEF COMPLAINT:  Here for follow-up and surveillance of throat cancer  Narrative:  The patient returns today for routine follow-up.  [name] presents today for follow-up after completing radiation to his base of tongue on 01/24/2022. He most recently underwent a PET scan on 04/17/22 showing complete metabolic response to treatment. No residual or hypermetabolism in the neck. Mildly hypermetabolic right parotid gland nodule is stable. I personally reviewed his images w/ him  Pain issues, if any: continues to have throat pain with mouth dryness and eating Using a feeding tube?: no Weight changes, if any: regaining weight slowly    Wt Readings from Last 3 Encounters:  04/22/22 177 lb 12.8 oz (80.6 kg)  02/07/22 179 lb 9.6 oz (81.5 kg)  11/27/21 193 lb 12.8 oz (87.9 kg)   Swallowing issues, if any: yes, with dryness Smoking or chewing tobacco? Quit smoking in August Using fluoride trays daily? no Last ENT visit was on: has not seen since treatment  Other notable issues, if any: wants pet scan results   ALLERGIES:  has No Known Allergies.  Meds: Current Outpatient Medications  Medication Sig Dispense Refill   apixaban (ELIQUIS) 5  MG TABS tablet Take 5 mg by mouth 2 (two) times daily.     atorvastatin (LIPITOR) 40 MG tablet Take 1 tablet (40 mg total) by mouth daily. 90 tablet 3   diltiazem (CARDIZEM) 30 MG tablet Take 1 tablet (30 mg total) by mouth every 6 (six) hours as needed (palpitations). 30 tablet 1   empagliflozin (JARDIANCE) 10 MG TABS tablet Take 1 tablet (10 mg total) by mouth daily before breakfast. 90 tablet 3   esomeprazole (NEXIUM) 40 MG capsule Take 40 mg by mouth daily.  12   flecainide (TAMBOCOR) 50 MG tablet Take 1 tablet (50 mg total) by mouth 2 (two) times daily. 60 tablet 11   glimepiride (AMARYL) 1 MG tablet Take 1 mg by mouth every evening.     HYDROcodone-acetaminophen (HYCET) 7.5-325 mg/15 ml solution Take 10 mLs by mouth 3 (three) times daily as needed for moderate pain. Take with food. 150 mL 0   lidocaine (XYLOCAINE) 2 % solution Patient: Mix 1part 2% viscous lidocaine, 1part H20. Swish & swallow 74m of diluted mixture, 362m before meals and at bedtime, up to QID 150 mL 5   lisinopril (ZESTRIL) 10 MG tablet Take 1 tablet (10 mg total) by mouth daily. 30 tablet 1   metFORMIN (GLUCOPHAGE) 1000 MG tablet Take 1,000 mg by mouth 2 (two) times daily.  12   metoprolol succinate (TOPROL-XL) 50 MG 24 hr tablet Take 1 tablet (50 mg total) by mouth in the morning and at bedtime. Take  with or immediately following a meal. 60 tablet 0   Multiple Vitamin (MULTIVITAMIN WITH MINERALS) TABS tablet Take 1 tablet by mouth daily.     temazepam (RESTORIL) 22.5 MG capsule Take 1 capsule (22.5 mg total) by mouth at bedtime as needed for sleep. 7 capsule 0   Current Facility-Administered Medications  Medication Dose Route Frequency Provider Last Rate Last Admin   sodium chloride flush (NS) 0.9 % injection 3 mL  3 mL Intravenous Q12H Imogene Burn, PA-C        Physical Findings: The patient is in no acute distress. Patient is alert and oriented. Wt Readings from Last 3 Encounters:  04/22/22 177 lb 12.8 oz  (80.6 kg)  02/07/22 179 lb 9.6 oz (81.5 kg)  11/27/21 193 lb 12.8 oz (87.9 kg)    height is 5' 11.5" (1.816 m) and weight is 177 lb 12.8 oz (80.6 kg). His temperature is 97.8 F (36.6 C). His blood pressure is 135/61 and his pulse is 73. His respiration is 18 and oxygen saturation is 100%. .  General: Alert and oriented, in no acute distress HEENT: Head is normocephalic. Extraocular movements are intact. Oropharynx is notable for no lesions in upper throat.  Neck: Neck is notable for healing skin, resolving pigment changes over the base of the neck, skin intact; no obvious masses. Mild lymphedema in anterior neck.  Cardiovascular: Notable for irregular rhythm and slightly bradycardic rate.  Pulmonary: CTA in all lung fields Skin: Resolving pigment changes over the base of the neck Extremities: No cyanosis or clubbing in the upper extremities.  Lymphatics: see Neck Exam Psychiatric: Judgment and insight are intact. Affect is appropriate.   Lab Findings: Lab Results  Component Value Date   WBC 10.0 10/08/2021   HGB 14.7 10/08/2021   HCT 43.7 10/08/2021   MCV 92 10/08/2021   PLT 234 10/08/2021    Lab Results  Component Value Date   TSH 0.759 11/27/2021    Radiographic Findings: NM PET Image Restag (PS) Skull Base To Thigh  Result Date: 04/18/2022 CLINICAL DATA:  Subsequent treatment strategy for squamous cell carcinoma of the tongue base. EXAM: NUCLEAR MEDICINE PET SKULL BASE TO THIGH TECHNIQUE: 9.47 mCi F-18 FDG was injected intravenously. Full-ring PET imaging was performed from the skull base to thigh after the radiotracer. CT data was obtained and used for attenuation correction and anatomic localization. Fasting blood glucose: 129 mg/dl COMPARISON:  PET-CT 09/12/2021 FINDINGS: Mediastinal blood pool activity: SUV max 2.15 Liver activity: SUV max NA NECK: Complete metabolic response is demonstrated. No residual measurable disease or hypermetabolism in the left tongue base region.  No residual measurable left level 2 nodal disease or hypermetabolism. Stable small solid right parotid gland nodule measuring 5 mm. SUV max is 2.24. Incidental CT findings: Stable age advanced bilateral carotid artery calcifications. CHEST: No hypermetabolic mediastinal or hilar nodes. No suspicious pulmonary nodules on the CT scan. Incidental CT findings: Stable aortic and three-vessel coronary artery calcifications. No acute pulmonary findings or worrisome pulmonary nodules. Stable advanced emphysematous changes and pulmonary scarring. ABDOMEN/PELVIS: Stable left adrenal gland adenoma since 2017. No findings for abdominal/pelvic metastatic disease. This remains hypermetabolic with SUV max of 2.83. It is most likely a functioning adenoma. A small right adenoma is also noted. Incidental CT findings: Stable age advanced atherosclerotic calcifications involving the aorta and branch vessels but no aneurysm. Moderate prostate gland enlargement. SKELETON: No focal hypermetabolic activity to suggest skeletal metastasis. Incidental CT findings: None. IMPRESSION: 1. Complete metabolic response to treatment.  No residual measurable disease or hypermetabolism in the neck. 2. No findings for metastatic disease involving the chest, abdomen/pelvis or bony structures. 3. Stable small mildly hypermetabolic right parotid gland nodule. 4. Stable bilateral adrenal gland adenomas. Electronically Signed   By: Marijo Sanes M.D.   On: 04/18/2022 13:03    Impression/Plan:    1) Head and Neck Cancer Status: healing well from RT. Most recent PET scan shows no evidence of disease recurrence or metastasis. I personally reviewed the images with the patient. He fortunately is in remission at this time.   2) Nutritional Status:  stable Wt Readings from Last 3 Encounters:  04/22/22 177 lb 12.8 oz (80.6 kg)  02/07/22 179 lb 9.6 oz (81.5 kg)  11/27/21 193 lb 12.8 oz (87.9 kg)   PEG tube: none  3) Risk Factors: The patient has been  educated about risk factors including alcohol and tobacco abuse; they understand that avoidance of alcohol and tobacco is important to prevent recurrences as well as other cancers - abstaining from smoking  4) Swallowing: functional, SLP has taught exercises. No longer seeing swallowing therapy.   5) Thyroid function:  check yearly Lab Results  Component Value Date   TSH 0.759 11/27/2021   6) Other: Dry mouth patient is experiencing is likely a residual effect from radiation. Continue to drink plenty of fluids. Lymphedema palpated on physical exam, will refer to PT for possible massage therapy management.   7) Follow-up in 3 months with Dr. Constance Holster and routine follow up with me in 6 months. Will likely get repeat imaging in 12 months.   On date of service, in total, I spent 25 minutes on this encounter. Patient was seen in person. _____________________________________   Leona Singleton, PA    [name], MD

## 2022-04-23 ENCOUNTER — Other Ambulatory Visit: Payer: Self-pay

## 2022-04-23 DIAGNOSIS — Z1329 Encounter for screening for other suspected endocrine disorder: Secondary | ICD-10-CM

## 2022-04-23 NOTE — Progress Notes (Signed)
Oncology Nurse Navigator Documentation   Per patient's 04/22/22 post-treatment follow-up with Dr. Isidore Moos, sent fax to [facility] ENT Scheduling with request Mr. Yabut be contacted and scheduled for routine post-RT follow-up with Dr. Constance Holster in 3 months. Notification of successful fax transmission received.   Harlow Asa RN, BSN, OCN Head & Neck Oncology Nurse [employer] at [facility] Phone # [phone]  Fax # [phone]

## 2022-05-12 ENCOUNTER — Telehealth: Payer: Self-pay

## 2022-05-12 NOTE — Progress Notes (Signed)
Oncology Nurse Navigator Documentation   Trevor Steele called today reporting continued throat pain after completing radiation 01/24/22 for base of tongue cancer. Dr. Isidore Moos was notified and recommended he be seen by his ENT Dr. Constance Holster for evaluation. She is concerned that his pain may have another cause due to the length of time since radiation finished. I called [facility] and scheduled him an appointment on 05/15/22 with Dr. Constance Holster. I called Mr. Shimabukuro to inform him of the appointment date and time but he will be out of town. Mr. Chuba has the number to [facility] and will call them to reschedule the 05/15/22 appointment. I have notified Dr. Isidore Moos and Drema Pry RN.   Harlow Asa RN, BSN, OCN Head & Neck Oncology Nurse [place] at [facility] Phone # [phone]  Fax # [phone]

## 2022-05-12 NOTE — Telephone Encounter (Signed)
RN called pharmacy to ensure that they have a good supply of pt pain medication and they stated they did. Rn communicated this in a message to Dr. Isidore Moos as well.

## 2022-05-12 NOTE — Telephone Encounter (Signed)
Rn called pt to get update on pt overall condition with multiple request coming to nursing staff [facility] as well), concerning pt pain medications. Pt reports that the pain has improved however has gotten worse when he has not been modifying his diet and not using mouth rinse as much. Rn encouraged him to increase his mouth wash again and eat smaller, soft foods for now as throat is irritated. Rn will notify pt when we have a resolution on the refill request for medication.

## 2022-05-12 NOTE — Telephone Encounter (Signed)
RN called pt back concerning need for refill on pain medication. Rn spoke to [employer] who stated that based on timeline of treatment and that pt s/s report was worrisome for delayed throat healing. She stated she felt like a refill of Hycet was not needed at this time. Md wants pt to follow up with Dr. Constance Holster. Rn spoke with [facility], who will call and get pt a follow  up appointment with Dr. Constance Holster. Rn also reinforced diet modifications as well as using mouth wash more often as well to help with s/s.

## 2022-05-22 NOTE — Therapy (Signed)
OUTPATIENT PHYSICAL THERAPY LYMPHEDEMA EVALUATION  Patient Name: Trevor Steele MRN: [id number] DOB:1961/01/18, 62 y.o., male Today's Date: 05/23/2022  END OF SESSION:  PT End of Session - 05/23/22 1014     Visit Number 1    Number of Visits 6    Date for PT Re-Evaluation 07/04/22    Authorization Type BCBS    Progress Note Due on Visit 6    PT Start Time 0810    PT Stop Time 0855    PT Time Calculation (min) 45 min    Activity Tolerance Patient tolerated treatment well             Past Medical History:  Diagnosis Date   Agatston coronary artery calcium score greater than 400    coronary calcium score high at 1183 which is 103 percentile for age, race and sex matched controls.   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[place])    by echo 07/2021 though not corroborated on CT   Mitral regurgitation    Neck mass    Abnormal neck CT 07/2021 concerning for nodal metastasis   PAF (paroxysmal atrial fibrillation) (HCC)    Pneumonia    Pulmonary nodules    PVC's (premature ventricular contractions)    Past Surgical History:  Procedure Laterality Date   BACK SURGERY     BACK SURGERY     lumbar   LARYNGOSCOPY AND ESOPHAGOSCOPY N/A 10/30/2021   Procedure: DIRECT LARYNGOSCOPY WITH BIOPSY; ESOPHAGOSCOPY;  Surgeon: [name], MD;  Location: [place];  Service: ENT;  Laterality: N/A;   LEFT HEART CATH AND CORONARY ANGIOGRAPHY N/A 10/09/2021   Procedure: LEFT HEART CATH AND CORONARY ANGIOGRAPHY;  Surgeon: Burnell Blanks, MD;  Location: [place] CV LAB;  Service: Cardiovascular;  Laterality: N/A;   TONSILLECTOMY Bilateral 10/30/2021   Procedure: TONSILLECTOMY; FROZEN  SECTION;  Surgeon: [name], MD;  Location: [facility] OR;  Service: ENT;  Laterality: Bilateral;   Patient Active Problem List   Diagnosis Date Noted   Encounter for preoperative dental examination 11/19/2021   Teeth missing 11/19/2021   Caries 11/19/2021   Incipient enamel caries 11/19/2021   Accretions on teeth 11/19/2021   Chronic periodontitis 11/19/2021   Impacted third molar tooth 11/19/2021   Loose, teeth 11/19/2021   Malocclusion 11/19/2021   Dental attrition, excessive, limited to enamel 11/19/2021   Defective dental restoration 11/19/2021   Malignant neoplasm of base of tongue ([place]) 11/06/2021   Abnormal stress test    Atrial fibrillation with RVR ([place]) 08/02/2021   Type 2 diabetes mellitus ([place]) 08/02/2021   Essential hypertension 08/02/2021   Anticoagulated (apixaban) 08/02/2021   Hyperlipidemia associated with type 2 diabetes mellitus ([place]) 08/02/2021   High risk medication use (pioglitazone) 08/02/2021   Intermittent heart palpitations 08/02/2021   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  He went through radiation therapy which has ended.  He states that he went for his follow up and his MD noted that he was getting swelling in his throat.  He denies any swelling in his tongue just in his neck.    PERTINENT HISTORY: Cancer, heart cath  PAIN:  Are you having pain? No  PRECAUTIONS: Other: cellulitis, difficulty  swallowing   WEIGHT BEARING RESTRICTIONS: No  FALLS:  Has patient fallen in last 6 months? No PATIENT GOALS: less swelling    OBJECTIVE:  COGNITION: Overall cognitive status: Within functional limits for tasks assessed   PALPATION: Noted edema   OBSERVATIONS / OTHER ASSESSMENTS: noted swelling in anterior aspect of throat.      UPPER EXTREMITY AROM/PROM: wfl CERVICAL AROM: All within normal limits:     LYMPHEDEMA ASSESSMENTS:   SURGERY TYPE/DATE: laryngoscopy and esophogoscopy on 10/30/2021  NUMBER OF LYMPH NODES REMOVED: unknown    CHEMOTHERAPY: none  RADIATION:ended 01/24/22  HORMONE TREATMENT: none  INFECTIONS: none  LYMPHEDEMA ASSESSMENTS:   LANDMARK   eval  Superior neck 45.8  Mid neck 44.7  Lower neck 39.7  Lt nostril to tragus 9.3  Rt nostril to tragus 9.5  Crown to chin anterior of ear.   (Blank rows = not tested)    TODAY'S TREATMENT:                                                                                                                              DATE: 05/23/22 Education on lymphedema, what it is , that it is not curable and how we manage it.  The importance of posture, exercise and beginning of self manual education  PATIENT EDUCATION:  Education details: HEP, self manual 1-8 for head and neck Person educated: Patient Education method: Explanation Education comprehension: verbalized understanding, returned demonstration, and needs further education  HOME EXERCISE PROGRAM: Access Code: [email or web address] URL: [email or web address] Date: 05/23/2022 Prepared by: Rayetta Humphrey  Exercises - Seated Correct Posture  - 1 x daily - 7 x weekly - 1 sets - 10 reps - 3-5" hold - Seated Cervical Retraction  - 1 x daily - 7 x weekly - 1 sets - 10 reps - 3-5" hold - Seated Scapular Retraction  - 1 x daily - 7 x weekly - 1 sets - 10 reps - 3-5" hold - Seated Cervical Rotation AROM  - 1 x daily - 7 x weekly - 1 sets - 10 reps - 3-5"  hold - Seated Cervical Sidebending AROM  - 1 x daily - 7 x weekly - 1 sets - 10 reps - 3-5" hold - Seated Cervical Extension AROM  - 1 x daily - 7 x weekly - 1 sets - 10 reps - 3-5" hold  ASSESSMENT:  CLINICAL IMPRESSION: Patient is a 62 y.o. male who was seen today for physical therapy evaluation and treatment for cervical  lymphedema. Evaluation demonstrates increased edema in cervical area consistent with lymphedema following radiation treatment for tongue cancer.  Mr. Blazejewski will benefit from skilled PT for education on self manual and compression to reduce his current edema and prevent edema from building up so that pt does not have any swallowing difficulties.   OBJECTIVE IMPAIRMENTS: increased edema.   ACTIVITY LIMITATIONS: self feeding   REHAB POTENTIAL: Good  CLINICAL DECISION MAKING: Stable/uncomplicated  EVALUATION COMPLEXITY: Moderate  GOALS: Goals reviewed with patient? No  SHORT TERM GOALS: Target date: 06/13/2022  PT to be I in HEP to improve posture and maintain cervical ROM Baseline: Goal status: INITIAL  2.  Pt to be I in self manual techniques to decrease edema by 1-2 cm to ensure normal swallowing  Baseline:  Goal status: INITIAL   LONG TERM GOALS: Target date: 07/04/2022  PT to be wearing compression garment for 3-4 hours a day to prevent build up of edema. Baseline:  Goal status: INITIAL  2.  PT to have lost 2-4 cm of edema in cervical area for self image and improved swallowing  Baseline:  Goal status: INITIAL   PLAN:  PT FREQUENCY: 1x/week PT works M-TH 10 hour shifts   PT DURATION: 6 weeks  PLANNED INTERVENTIONS: Therapeutic exercises, Patient/Family education, Self Care, Manual lymph drainage, Compression bandaging, and Manual therapy  PLAN FOR NEXT SESSION: Begin total decongestive lymphatic techniques   Rayetta Humphrey, PT CLT [phone]  05/23/2022, 10:15 AM

## 2022-05-23 ENCOUNTER — Ambulatory Visit (HOSPITAL_COMMUNITY): Payer: BC Managed Care – PPO | Attending: Radiology | Admitting: Physical Therapy

## 2022-05-23 ENCOUNTER — Other Ambulatory Visit: Payer: Self-pay

## 2022-05-23 DIAGNOSIS — C01 Malignant neoplasm of base of tongue: Secondary | ICD-10-CM | POA: Insufficient documentation

## 2022-05-23 DIAGNOSIS — I890 Lymphedema, not elsewhere classified: Secondary | ICD-10-CM | POA: Diagnosis not present

## 2022-05-27 ENCOUNTER — Encounter (HOSPITAL_COMMUNITY): Payer: BC Managed Care – PPO | Admitting: Physical Therapy

## 2022-05-28 ENCOUNTER — Encounter (HOSPITAL_COMMUNITY): Payer: BC Managed Care – PPO | Admitting: Physical Therapy

## 2022-05-29 ENCOUNTER — Encounter (HOSPITAL_COMMUNITY): Payer: BC Managed Care – PPO | Admitting: Physical Therapy

## 2022-05-30 ENCOUNTER — Other Ambulatory Visit: Payer: Self-pay

## 2022-05-30 ENCOUNTER — Ambulatory Visit (HOSPITAL_COMMUNITY): Payer: BC Managed Care – PPO | Admitting: Physical Therapy

## 2022-05-30 DIAGNOSIS — I890 Lymphedema, not elsewhere classified: Secondary | ICD-10-CM

## 2022-05-30 DIAGNOSIS — C01 Malignant neoplasm of base of tongue: Secondary | ICD-10-CM | POA: Diagnosis not present

## 2022-05-30 NOTE — Therapy (Signed)
OUTPATIENT PHYSICAL THERAPY LYMPHEDEMA Treatment  Patient Name: Trevor Steele MRN: [id number] DOB:08/01/1960, 62 y.o., male Today's Date: 05/30/2022  END OF SESSION:  PT End of Session - 05/30/22 0858    Visit Number 2    Number of Visits 6    Date for PT Re-Evaluation 07/04/22    Authorization Type BCBS    Progress Note Due on Visit 6    PT Start Time 0818    PT Stop Time 0858    PT Time Calculation (min) 40 min    Activity Tolerance Patient tolerated treatment well    Behavior During Therapy [facility] for tasks assessed/performed             Past Medical History:  Diagnosis Date   Agatston coronary artery calcium score greater than 400    coronary calcium score high at 1183 which is 78 percentile for age, race and sex matched controls.   Aortic atherosclerosis (HCC)    Aortic stenosis    COVID 06/28/2021   no symptoms, went to hospital with a-fib and diagnosed then   Diabetes mellitus without complication ([place])    Dysrhythmia    A-fib   GERD (gastroesophageal reflux disease)    High cholesterol    History of kidney stones    Hypertension    Mild dilation of ascending aorta ([place])    by echo 07/2021 though not corroborated on CT   Mitral regurgitation    Neck mass    Abnormal neck CT 07/2021 concerning for nodal metastasis   PAF (paroxysmal atrial fibrillation) (HCC)    Pneumonia    Pulmonary nodules    PVC's (premature ventricular contractions)    Past Surgical History:  Procedure Laterality Date   BACK SURGERY     BACK SURGERY     lumbar   LARYNGOSCOPY AND ESOPHAGOSCOPY N/A 10/30/2021   Procedure: DIRECT LARYNGOSCOPY WITH BIOPSY; ESOPHAGOSCOPY;  Surgeon: [name], MD;  Location: [place];  Service: ENT;  Laterality: N/A;   LEFT HEART CATH AND CORONARY ANGIOGRAPHY N/A 10/09/2021   Procedure: LEFT HEART CATH AND CORONARY ANGIOGRAPHY;  Surgeon: Burnell Blanks, MD;  Location: [place] CV LAB;  Service: Cardiovascular;  Laterality: N/A;   TONSILLECTOMY  Bilateral 10/30/2021   Procedure: TONSILLECTOMY; FROZEN SECTION;  Surgeon: [name], MD;  Location: [facility] OR;  Service: ENT;  Laterality: Bilateral;   Patient Active Problem List   Diagnosis Date Noted   Encounter for preoperative dental examination 11/19/2021   Teeth missing 11/19/2021   Caries 11/19/2021   Incipient enamel caries 11/19/2021   Accretions on teeth 11/19/2021   Chronic periodontitis 11/19/2021   Impacted third molar tooth 11/19/2021   Loose, teeth 11/19/2021   Malocclusion 11/19/2021   Dental attrition, excessive, limited to enamel 11/19/2021   Defective dental restoration 11/19/2021   Malignant neoplasm of base of tongue ([place]) 11/06/2021   Abnormal stress test    Atrial fibrillation with RVR ([place]) 08/02/2021   Type 2 diabetes mellitus ([place]) 08/02/2021   Essential hypertension 08/02/2021   Anticoagulated (apixaban) 08/02/2021   Hyperlipidemia associated with type 2 diabetes mellitus ([place]) 08/02/2021   High risk medication use (pioglitazone) 08/02/2021   Intermittent heart palpitations 08/02/2021   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  He has been doing his manual as well  PERTINENT HISTORY: Cancer, heart cath  PAIN:  Are you having pain? No  PRECAUTIONS: Other: cellulitis, difficulty swallowing   WEIGHT BEARING RESTRICTIONS: No  FALLS:  Has patient fallen in last 6 months? No PATIENT GOALS: less swelling     OBJECTIVE:  COGNITION: Overall cognitive status: Within functional limits for tasks assessed   PALPATION: Noted edema   OBSERVATIONS / OTHER ASSESSMENTS: noted swelling in anterior aspect of throat.    UPPER EXTREMITY AROM/PROM: wfl CERVICAL AROM: All within normal limits:     LYMPHEDEMA ASSESSMENTS:   SURGERY TYPE/DATE: laryngoscopy and esophogoscopy on 10/30/2021  NUMBER OF LYMPH NODES REMOVED: unknown    CHEMOTHERAPY: none  RADIATION:ended 01/24/22  HORMONE TREATMENT: none  INFECTIONS: none  LYMPHEDEMA ASSESSMENTS:   LANDMARK   eval 05/30/22  Superior neck 45.8 46  Mid neck 44.7 43.8  Lower neck 39.7 39.7  Lt nostril to tragus 9.3 9.1  Rt nostril to tragus 9.5 9.3  Crown to chin anterior of ear.    (Blank rows = not tested)    TODAY'S TREATMENT:                                                                                                                              DATE: 05/30/22: Therapist cut foam for compression and given to patient with instruction to attempt to wear for four hours.  Manual completed to include Deep and surface lymphatic system as well as cervical and facial area.  Therapist instructed pt on self manual for neck and facial area.    05/23/22 Education on lymphedema, what it is , that it is not curable and how we manage it.  The importance of posture, exercise and beginning of self manual education  PATIENT EDUCATION:  Education details: HEP, self manual 1-8 for head and neck Person educated: Patient Education method: Explanation Education comprehension: verbalized understanding, returned demonstration, and needs further education  HOME EXERCISE PROGRAM: Access Code: [email or web address] URL: [email or web address] Date: 05/23/2022 Prepared by: Rayetta Humphrey  Exercises - Seated Correct Posture  - 1 x daily - 7 x weekly - 1 sets - 10 reps - 3-5" hold - Seated Cervical Retraction  - 1 x daily - 7 x weekly - 1 sets - 10 reps  - 3-5" hold - Seated Scapular Retraction  - 1 x daily - 7 x weekly - 1 sets - 10 reps - 3-5" hold - Seated Cervical Rotation AROM  - 1 x daily - 7 x weekly - 1 sets - 10 reps - 3-5" hold - Seated Cervical Sidebending AROM  - 1 x daily - 7 x weekly - 1 sets - 10 reps - 3-5" hold - Seated Cervical Extension AROM  - 1 x daily - 7 x weekly - 1 sets - 10 reps - 3-5" hold  ASSESSMENT:  CLINICAL IMPRESSION: Pt compression cut and donned.  Entire head and neck routine completed.   Mr. Cleland will benefit from skilled PT for education on self manual and compression to reduce his current edema and prevent edema from building up so that pt does not have any swallowing difficulties.   OBJECTIVE IMPAIRMENTS: increased edema.   ACTIVITY LIMITATIONS: self feeding   REHAB POTENTIAL: Good  CLINICAL DECISION MAKING: Stable/uncomplicated  EVALUATION COMPLEXITY: Moderate  GOALS: Goals reviewed with patient? No  SHORT TERM GOALS: Target date: 06/13/2022  PT to be I in HEP to improve posture and maintain cervical ROM Baseline: Goal status: met  2.  Pt to be I in self manual techniques to decrease edema by 1-2 cm to ensure normal swallowing  Baseline:  Goal status: IN PROGRESS   LONG TERM GOALS: Target date: 07/04/2022  PT to be wearing compression garment for 3-4 hours a day to prevent build up of edema. Baseline:  Goal status: IN PROGRESS  2.  PT to have lost 2-4 cm of edema in cervical area for self image and improved swallowing  Baseline:  Goal status: IN PROGRESS   PLAN:  PT FREQUENCY: 1x/week PT works M-TH 10 hour shifts   PT DURATION: 6 weeks  PLANNED INTERVENTIONS: Therapeutic exercises, Patient/Family education, Self Care, Manual lymph drainage, Compression bandaging, and Manual therapy  PLAN FOR NEXT SESSION: Begin total decongestive lymphatic techniques   Rayetta Humphrey, PT CLT [phone]  05/30/2022, 8:17 AM

## 2022-06-02 ENCOUNTER — Encounter (HOSPITAL_COMMUNITY): Payer: BC Managed Care – PPO | Admitting: Physical Therapy

## 2022-06-04 ENCOUNTER — Encounter (HOSPITAL_COMMUNITY): Payer: BC Managed Care – PPO | Admitting: Physical Therapy

## 2022-06-13 ENCOUNTER — Ambulatory Visit (HOSPITAL_COMMUNITY): Payer: BC Managed Care – PPO | Attending: Radiology | Admitting: Physical Therapy

## 2022-06-13 DIAGNOSIS — I890 Lymphedema, not elsewhere classified: Secondary | ICD-10-CM | POA: Insufficient documentation

## 2022-06-13 NOTE — Therapy (Addendum)
OUTPATIENT PHYSICAL THERAPY LYMPHEDEMA Treatment  Patient Name: Trevor Steele MRN: [id number] DOB:1960-10-29, 62 y.o., male Today's Date: 06/13/2022  END OF SESSION:  PT End of Session - 06/13/22 1641     Visit Number 3    Number of Visits 6    Date for PT Re-Evaluation 07/04/22    Authorization Type BCBS    Progress Note Due on Visit 6    PT Start Time 1603    PT Stop Time 1641    PT Time Calculation (min) 38 min    Activity Tolerance Patient tolerated treatment well    Behavior During Therapy WFL for tasks assessed/performed                 Past Medical History:  Diagnosis Date   Agatston coronary artery calcium score greater than 400    coronary calcium score high at 1183 which is 22 percentile for age, race and sex matched controls.   Aortic atherosclerosis (HCC)    Aortic stenosis    COVID 06/28/2021   no symptoms, went to hospital with a-fib and diagnosed then   Diabetes mellitus without complication ([place])    Dysrhythmia    A-fib   GERD (gastroesophageal reflux disease)    High cholesterol    History of kidney stones    Hypertension    Mild dilation of ascending aorta ([place])    by echo 07/2021 though not corroborated on CT   Mitral regurgitation    Neck mass    Abnormal neck CT 07/2021 concerning for nodal metastasis   PAF (paroxysmal atrial fibrillation) (HCC)    Pneumonia    Pulmonary nodules    PVC's (premature ventricular contractions)    Past Surgical History:  Procedure Laterality Date   BACK SURGERY     BACK SURGERY     lumbar   LARYNGOSCOPY AND ESOPHAGOSCOPY N/A 10/30/2021   Procedure: DIRECT LARYNGOSCOPY WITH BIOPSY; ESOPHAGOSCOPY;  Surgeon: [name], MD;  Location: [facility];  Service: ENT;  Laterality: N/A;   LEFT HEART CATH AND CORONARY ANGIOGRAPHY N/A 10/09/2021   Procedure: LEFT HEART CATH AND CORONARY ANGIOGRAPHY;  Surgeon: Burnell Blanks, MD;  Location: [place] [facility];  Service: Cardiovascular;  Laterality: N/A;    TONSILLECTOMY Bilateral 10/30/2021   Procedure: TONSILLECTOMY; FROZEN SECTION;  Surgeon: [name], MD;  Location: [facility] OR;  Service: ENT;  Laterality: Bilateral;   Patient Active Problem List   Diagnosis Date Noted   Encounter for preoperative dental examination 11/19/2021   Teeth missing 11/19/2021   Caries 11/19/2021   Incipient enamel caries 11/19/2021   Accretions on teeth 11/19/2021   Chronic periodontitis 11/19/2021   Impacted third molar tooth 11/19/2021   Loose, teeth 11/19/2021   Malocclusion 11/19/2021   Dental attrition, excessive, limited to enamel 11/19/2021   Defective dental restoration 11/19/2021   Malignant neoplasm of base of tongue ([place]) 11/06/2021   Abnormal stress test    Atrial fibrillation with RVR ([place]) 08/02/2021   Type 2 diabetes mellitus ([place]) 08/02/2021   Essential hypertension 08/02/2021   Anticoagulated (apixaban) 08/02/2021   Hyperlipidemia associated with type 2 diabetes mellitus ([place]) 08/02/2021   High risk medication use (pioglitazone) 08/02/2021   Intermittent heart palpitations 08/02/2021   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  Once the compression is off there is hardly any swelling but it comes back.    PERTINENT HISTORY: Cancer, heart cath  PAIN:  Are you having pain? No  PRECAUTIONS: Other: cellulitis, difficulty swallowing   WEIGHT BEARING RESTRICTIONS: No  FALLS:  Has patient  fallen in last 6 months? No PATIENT GOALS: less swelling    OBJECTIVE:  COGNITION: Overall cognitive status: Within functional limits for tasks assessed   PALPATION: Noted edema   OBSERVATIONS / OTHER ASSESSMENTS: noted swelling in anterior aspect of throat.    UPPER EXTREMITY AROM/PROM: wfl CERVICAL AROM: All within normal limits:     LYMPHEDEMA ASSESSMENTS:   SURGERY TYPE/DATE: laryngoscopy and esophogoscopy on 10/30/2021  NUMBER OF LYMPH NODES REMOVED: unknown    CHEMOTHERAPY: none  RADIATION:ended 01/24/22  HORMONE TREATMENT: none  INFECTIONS: none  LYMPHEDEMA ASSESSMENTS:   LANDMARK   eval 05/30/22 06/13/2022  Superior neck 45.8 46 45.3  Mid neck 44.7 43.8 42.8  Lower neck 39.7 39.7 38.3  Lt nostril to tragus 9.3 9.1 9.0  Rt nostril to tragus 9.5 9.3 9.2  Crown to chin anterior of ear.     (Blank rows = not tested)    TODAY'S TREATMENT:                                                                                                                              DATE: 06/13/2022 Total decongestive techniques completed with education to wife. 05/30/22: Therapist cut foam for compression and given to patient with instruction to attempt to wear for four hours.  Manual completed to include Deep and surface lymphatic system as well as cervical and facial area.  Therapist instructed pt on self manual for neck and facial area.    05/23/22 Education  on lymphedema, what it is , that it is not curable and how we manage it.  The importance of posture, exercise and beginning of self manual education  PATIENT EDUCATION:  Education details: HEP, self manual 1-8 for head and neck Person educated: Patient Education method: Explanation Education comprehension: verbalized understanding, returned demonstration, and needs further education  HOME EXERCISE PROGRAM: Access Code: [email or web address] URL: [email or web address] Date: 05/23/2022 Prepared by: Rayetta Humphrey  Exercises - Seated Correct Posture  - 1 x daily - 7 x weekly - 1 sets - 10 reps - 3-5" hold - Seated Cervical Retraction  - 1 x daily - 7 x weekly - 1 sets - 10 reps - 3-5" hold - Seated Scapular Retraction  - 1 x daily - 7 x weekly - 1 sets - 10 reps - 3-5" hold - Seated Cervical Rotation AROM  - 1 x daily - 7 x weekly - 1 sets - 10 reps - 3-5" hold - Seated Cervical Sidebending AROM  - 1 x daily - 7 x weekly - 1 sets - 10 reps - 3-5" hold - Seated Cervical Extension AROM  - 1 x daily - 7 x weekly - 1 sets - 10 reps - 3-5" hold  ASSESSMENT:  CLINICAL IMPRESSION: Pt brought his wife to learn how to complete manual for his head and neck.  Pt measured with noted decreased volume.    Mr. Puhr will continue benefit from skilled PT for education on self manual and compression to reduce his current edema and prevent edema from building up so that pt does not have any swallowing difficulties.   OBJECTIVE IMPAIRMENTS: increased edema.   ACTIVITY LIMITATIONS: self feeding   REHAB POTENTIAL: Good  CLINICAL DECISION MAKING: Stable/uncomplicated  EVALUATION COMPLEXITY: Moderate  GOALS: Goals reviewed with patient? No  SHORT TERM GOALS: Target date: 06/13/2022  PT to be I in HEP to improve posture and maintain cervical ROM Baseline: Goal status: met  2.  Pt to be I in self manual techniques to decrease edema by 1-2 cm to ensure normal swallowing  Baseline:  Goal status: IN PROGRESS   LONG TERM GOALS: Target date: 07/04/2022  PT to be wearing compression garment for 3-4 hours a day to prevent build up of edema. Baseline:  Goal status: IN PROGRESS  2.  PT to have lost 2-4 cm of edema in cervical area for self image and improved swallowing  Baseline:  Goal status: IN PROGRESS   PLAN:  PT FREQUENCY: 1x/week PT works M-TH 10 hour shifts   PT DURATION: 6 weeks  PLANNED INTERVENTIONS: Therapeutic exercises, Patient/Family education, Self Care, Manual lymph drainage,  Compression bandaging, and Manual therapy  PLAN FOR NEXT SESSION: Begin total decongestive lymphatic techniques   Rayetta Humphrey, PT CLT [phone]  06/13/2022 1640

## 2022-06-27 ENCOUNTER — Encounter (HOSPITAL_COMMUNITY): Payer: BC Managed Care – PPO | Admitting: Physical Therapy

## 2022-06-28 IMAGING — CT NM PET TUM IMG INITIAL (PI) SKULL BASE T - THIGH
1 of 7 series · 1 of 25 positions shown · non-contrast
Comparison: Neck CT 08/02/2021

CLINICAL DATA: Subsequent treatment strategy for metastatic
squamous cell carcinoma. Head neck origin.

EXAM:
NUCLEAR MEDICINE PET SKULL BASE TO THIGH
TECHNIQUE: 0.7 mCi F-18 FDG was injected intravenously. Full-ring PET imaging
was performed from the skull base to thigh after the radiotracer. CT
data was obtained and used for attenuation correction and anatomic
localization.
Fasting blood glucose: 170 mg/dl

[Series 4: pet ac · axial · 4.0mm · 4.11mm/px · 1 of 264 slices shown]
[im 176/264]
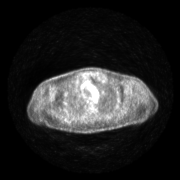

[1 of 25 positions shown; findings below may reference images not displayed]

FINDINGS: Mediastinal blood pool activity: SUV max

Liver activity: SUV max NA

NECK: Enlarged hypermetabolic LEFT level II lymph node measures 17
mm short axis (image 54/series 3) with SUV max equal 12.5. No
additional hypermetabolic LEFT or RIGHT cervical lymph nodes.

There is asymmetric hypermetabolic activity at the LEFT base of
tongue with SUV max equal 8.9 (image 58 fused data set). Activity is
deep at the base of tongue at the level of the aryepiglottic folds.

Incidental CT findings:

Mild metabolic activity associated with a dense nodule in the RIGHT
parotid gland measuring 7 mm (45/3) with SUV max equal 2.1.

none

CHEST: No hypermetabolic mediastinal or hilar nodes. No suspicious
pulmonary nodules on the CT scan.

Incidental CT findings: Coronary artery calcification and aortic
atherosclerotic calcification.

ABDOMEN/PELVIS: No abnormal hypermetabolic activity within the
liver, pancreas, adrenal glands, or spleen. No hypermetabolic lymph
nodes in the abdomen or pelvis.

Incidental CT findings: The LEFT adrenal gland is enlarged and
hypermetabolic at SUV max equal 4.5. However, the gland has low
density (HU equal 4.3) consistent with a benign adenoma. Smaller
adenoma of the RIGHT adrenal gland.

SKELETON: No focal hypermetabolic activity to suggest skeletal
metastasis.

Incidental CT findings: none
IMPRESSION: 1. Asymmetric hypermetabolic activity in the deep LEFT base of
tongue region concerning for primary squamous cell carcinoma.
2. Solitary hypermetabolic metastatic LEFT level II lymph node.
3. No additional evidence of squamous cell carcinoma metastasis.
4. Small lesion in the RIGHT parotid gland favored primary parotid
neoplasm.
5. Benign adrenal adenomas.

## 2022-07-03 ENCOUNTER — Encounter: Payer: Self-pay | Admitting: Radiology

## 2022-07-04 ENCOUNTER — Encounter (HOSPITAL_COMMUNITY): Payer: BC Managed Care – PPO | Admitting: Physical Therapy

## 2022-07-04 DIAGNOSIS — E118 Type 2 diabetes mellitus with unspecified complications: Secondary | ICD-10-CM | POA: Diagnosis not present

## 2022-07-11 ENCOUNTER — Encounter (HOSPITAL_COMMUNITY): Payer: BC Managed Care – PPO | Admitting: Physical Therapy

## 2022-07-11 DIAGNOSIS — R7309 Other abnormal glucose: Secondary | ICD-10-CM | POA: Diagnosis not present

## 2022-07-11 DIAGNOSIS — E1169 Type 2 diabetes mellitus with other specified complication: Secondary | ICD-10-CM | POA: Diagnosis not present

## 2022-07-11 DIAGNOSIS — Z923 Personal history of irradiation: Secondary | ICD-10-CM | POA: Diagnosis not present

## 2022-07-11 DIAGNOSIS — C109 Malignant neoplasm of oropharynx, unspecified: Secondary | ICD-10-CM | POA: Diagnosis not present

## 2022-07-11 DIAGNOSIS — C029 Malignant neoplasm of tongue, unspecified: Secondary | ICD-10-CM | POA: Diagnosis not present

## 2022-07-11 DIAGNOSIS — I480 Paroxysmal atrial fibrillation: Secondary | ICD-10-CM | POA: Diagnosis not present

## 2022-07-11 NOTE — Therapy (Deleted)
OUTPATIENT PHYSICAL THERAPY LYMPHEDEMA Treatment  Patient Name: Trevor Steele MRN: [id number] DOB:August 01, 1960, 62 y.o., male Today's Date: 07/11/2022  END OF SESSION:  PT End of Session - 06/13/22 1641     Visit Number 3    Number of Visits 6    Date for PT Re-Evaluation 07/04/22    Authorization Type BCBS    Progress Note Due on Visit 6    PT Start Time 1603    PT Stop Time 1641    PT Time Calculation (min) 38 min    Activity Tolerance Patient tolerated treatment well    Behavior During Therapy WFL for tasks assessed/performed                 Past Medical History:  Diagnosis Date   Agatston coronary artery calcium score greater than 400    coronary calcium score high at 1183 which is 34 percentile for age, race and sex matched controls.   Aortic atherosclerosis (HCC)    Aortic stenosis    COVID 06/28/2021   no symptoms, went to hospital with a-fib and diagnosed then   Diabetes mellitus without complication ([place])    Dysrhythmia    A-fib   GERD (gastroesophageal reflux disease)    High cholesterol    History of kidney stones    Hypertension    Mild dilation of ascending aorta ([place])    by echo 07/2021 though not corroborated on CT   Mitral regurgitation    Neck mass    Abnormal neck CT 07/2021 concerning for nodal metastasis   PAF (paroxysmal atrial fibrillation) (HCC)    Pneumonia    Pulmonary nodules    PVC's (premature ventricular contractions)    Past Surgical History:  Procedure Laterality Date   BACK SURGERY     BACK SURGERY     lumbar   LARYNGOSCOPY AND ESOPHAGOSCOPY N/A 10/30/2021   Procedure: DIRECT LARYNGOSCOPY WITH BIOPSY; ESOPHAGOSCOPY;  Surgeon: [name], MD;  Location: [place];  Service: ENT;  Laterality: N/A;   LEFT HEART CATH AND CORONARY ANGIOGRAPHY N/A 10/09/2021   Procedure: LEFT HEART CATH AND CORONARY ANGIOGRAPHY;  Surgeon: Burnell Blanks, MD;  Location: [place] [facility];  Service: Cardiovascular;  Laterality: N/A;    TONSILLECTOMY Bilateral 10/30/2021   Procedure: TONSILLECTOMY; FROZEN SECTION;  Surgeon: [name], MD;  Location: [facility] OR;  Service: ENT;  Laterality: Bilateral;   Patient Active Problem List   Diagnosis Date Noted   Encounter for preoperative dental examination 11/19/2021   Teeth missing 11/19/2021   Caries 11/19/2021   Incipient enamel caries 11/19/2021   Accretions on teeth 11/19/2021   Chronic periodontitis 11/19/2021   Impacted third molar tooth 11/19/2021   Loose, teeth 11/19/2021   Malocclusion 11/19/2021   Dental attrition, excessive, limited to enamel 11/19/2021   Defective dental restoration 11/19/2021   Malignant neoplasm of base of tongue ([place]) 11/06/2021   Abnormal stress test    Atrial fibrillation with RVR ([place]) 08/02/2021   Type 2 diabetes mellitus ([place]) 08/02/2021   Essential hypertension 08/02/2021   Anticoagulated (apixaban) 08/02/2021   Hyperlipidemia associated with type 2 diabetes mellitus ([place]) 08/02/2021   High risk medication use (pioglitazone) 08/02/2021   Intermittent heart palpitations 08/02/2021   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  He has been doing his self manual and using his compression garment.    PERTINENT HISTORY: Cancer, heart cath  PAIN:  Are you having pain? No  PRECAUTIONS: Other: cellulitis, difficulty swallowing   WEIGHT BEARING RESTRICTIONS: No  FALLS:  Has patient fallen  in last 6 months? No PATIENT GOALS: less swelling    OBJECTIVE:  COGNITION: Overall cognitive status: Within functional limits for tasks assessed   PALPATION: Noted edema   OBSERVATIONS / OTHER ASSESSMENTS: noted swelling in anterior aspect of throat.    UPPER EXTREMITY AROM/PROM: wfl CERVICAL AROM: All within normal limits:     LYMPHEDEMA ASSESSMENTS:   SURGERY TYPE/DATE: laryngoscopy and esophogoscopy on 10/30/2021  NUMBER OF LYMPH NODES REMOVED: unknown    CHEMOTHERAPY: none  RADIATION:ended 01/24/22  HORMONE TREATMENT: none  INFECTIONS: none  LYMPHEDEMA ASSESSMENTS:   LANDMARK   eval 05/30/22 06/13/2022 07/11/2022  Superior neck 45.8 46 45.3   Mid neck 44.7 43.8 42.8   Lower neck 39.7 39.7 38.3   Lt nostril to tragus 9.3 9.1 9.0   Rt nostril to tragus 9.5 9.3 9.2   Crown to chin anterior of ear.      (Blank rows = not tested)    TODAY'S TREATMENT:                                                                                                                              DATE: 06/13/2022  05/30/22: Therapist cut foam for compression and given to patient with instruction to attempt to wear for four hours.  Manual completed to include Deep and surface lymphatic system as well as cervical and facial area.  Therapist instructed pt on self manual for neck and facial area.    05/23/22 Education  on lymphedema, what it is , that it is not curable and how we manage it.  The importance of posture, exercise and beginning of self manual education  PATIENT EDUCATION:  Education details: HEP, self manual 1-8 for head and neck Person educated: Patient Education method: Explanation Education comprehension: verbalized understanding, returned demonstration, and needs further education  HOME EXERCISE PROGRAM: Access Code: [email or web address] URL: [email or web address] Date: 05/23/2022 Prepared by: Rayetta Humphrey  Exercises - Seated Correct Posture  - 1 x daily - 7 x  weekly - 1 sets - 10 reps - 3-5" hold - Seated Cervical Retraction  - 1 x daily - 7 x weekly - 1 sets - 10 reps - 3-5" hold - Seated Scapular Retraction  - 1 x daily - 7 x weekly - 1 sets - 10 reps - 3-5" hold - Seated Cervical Rotation AROM  - 1 x daily - 7 x weekly - 1 sets - 10 reps - 3-5" hold - Seated Cervical Sidebending AROM  - 1 x daily - 7 x weekly - 1 sets - 10 reps - 3-5" hold - Seated Cervical Extension AROM  - 1 x daily - 7 x weekly - 1 sets - 10 reps - 3-5" hold  ASSESSMENT:  CLINICAL IMPRESSION: Pt brought his wife to learn how to complete manual for his head and neck.  Pt measured with noted decreased volume.    Mr. Bailie will continue benefit from skilled PT for education on self manual and compression to reduce his current edema and prevent edema from building up so that pt does not have any swallowing difficulties.   OBJECTIVE IMPAIRMENTS: increased edema.   ACTIVITY LIMITATIONS: self feeding   REHAB POTENTIAL: Good  CLINICAL DECISION MAKING: Stable/uncomplicated  EVALUATION COMPLEXITY: Moderate  GOALS: Goals reviewed with patient? No  SHORT TERM GOALS: Target date: 06/13/2022  PT to be I in HEP to improve posture and maintain cervical ROM Baseline: Goal status: met  2.  Pt to be I in self manual techniques to decrease edema by 1-2 cm to ensure normal swallowing  Baseline:  Goal status: IN PROGRESS   LONG TERM GOALS: Target date: 07/04/2022  PT to be wearing compression garment for 3-4 hours a day to prevent build up of edema. Baseline:  Goal status: IN PROGRESS  2.  PT to have lost 2-4 cm of edema in cervical area for self image and improved swallowing  Baseline:  Goal status: IN PROGRESS   PLAN:  PT FREQUENCY: 1x/week PT works M-TH 10 hour shifts   PT DURATION: 6 weeks  PLANNED INTERVENTIONS: Therapeutic exercises, Patient/Family education, Self Care, Manual lymph drainage, Compression bandaging, and Manual therapy  PLAN FOR NEXT SESSION:  Begin total decongestive lymphatic techniques   Rayetta Humphrey, PT CLT [phone]  06/13/2022 1640

## 2022-07-14 ENCOUNTER — Other Ambulatory Visit: Payer: Self-pay | Admitting: Internal Medicine

## 2022-07-18 ENCOUNTER — Telehealth (HOSPITAL_COMMUNITY): Payer: Self-pay | Admitting: Physical Therapy

## 2022-07-18 ENCOUNTER — Encounter (HOSPITAL_COMMUNITY): Payer: BC Managed Care – PPO | Admitting: Physical Therapy

## 2022-07-18 NOTE — Telephone Encounter (Signed)
Called pt re missed appointments pt has been sick and states that he will definitely be here next Friday.   Rayetta Humphrey, [place] CLT [phone]

## 2022-07-25 ENCOUNTER — Ambulatory Visit (HOSPITAL_COMMUNITY): Payer: BC Managed Care – PPO | Attending: Radiology | Admitting: Physical Therapy

## 2022-07-25 DIAGNOSIS — I890 Lymphedema, not elsewhere classified: Secondary | ICD-10-CM | POA: Diagnosis not present

## 2022-07-25 NOTE — Therapy (Signed)
OUTPATIENT PHYSICAL THERAPY LYMPHEDEMA Discharge  Patient Name: Trevor Steele MRN: [id number] DOB:[birth year], 62 y.o., male Today's Date: 07/25/2022 PHYSICAL THERAPY DISCHARGE SUMMARY  Visits from Start of Care: 4  Current functional level related to goals / functional outcomes: I in exercises, manual and wearing compression    Remaining deficits: Slight swelling still noteable   Education / Equipment: Exercise, self manual and compression wearing    Patient agrees to discharge. Patient goals were met. Patient is being discharged due to meeting the stated rehab goals.  END OF SESSION:  PT End of Session - 07/25/22 838    Visit Number 4    Number of Visits 4    Date for PT Re-Evaluation 07/04/22    Authorization Type BCBS    Progress Note Due on Visit 4    PT Start Time 0815    PT Stop Time [phone]    PT Time Calculation (min) 23 min    Activity Tolerance Patient tolerated treatment well    Behavior During Therapy WFL for tasks assessed/performed                      Past Medical History:  Diagnosis Date   Agatston coronary artery calcium score greater than 400    coronary calcium score high at 1183 which is 49 percentile for age, race and sex matched controls.   Aortic atherosclerosis (HCC)    Aortic stenosis    COVID 06/28/2021   no symptoms, went to hospital with a-fib and diagnosed then   Diabetes mellitus without complication ([place])    Dysrhythmia    A-fib   GERD (gastroesophageal reflux disease)    High cholesterol    History of kidney stones    Hypertension    Mild dilation of ascending aorta ([place])    by echo 07/2021 though not corroborated on CT   Mitral regurgitation    Neck mass    Abnormal neck CT 07/2021 concerning for nodal metastasis   PAF (paroxysmal atrial fibrillation) (HCC)    Pneumonia    Pulmonary nodules    PVC's (premature ventricular contractions)    Past Surgical History:  Procedure Laterality Date   BACK SURGERY     BACK  SURGERY     lumbar   LARYNGOSCOPY AND ESOPHAGOSCOPY N/A 10/30/2021   Procedure: DIRECT LARYNGOSCOPY WITH BIOPSY; ESOPHAGOSCOPY;  Surgeon: [name], MD;  Location: [facility];  Service: ENT;  Laterality: N/A;   LEFT HEART CATH AND CORONARY ANGIOGRAPHY N/A 10/09/2021   Procedure: LEFT HEART CATH AND CORONARY ANGIOGRAPHY;  Surgeon: Burnell Blanks, MD;  Location: [facility];  Service: Cardiovascular;  Laterality: N/A;   TONSILLECTOMY Bilateral 10/30/2021   Procedure: TONSILLECTOMY; FROZEN SECTION;  Surgeon: [name], MD;  Location: [facility];  Service: ENT;  Laterality: Bilateral;   Patient Active Problem List   Diagnosis Date Noted   Encounter for preoperative dental examination 11/19/2021   Teeth missing 11/19/2021   Caries 11/19/2021   Incipient enamel caries 11/19/2021   Accretions on teeth 11/19/2021   Chronic periodontitis 11/19/2021   Impacted third molar tooth 11/19/2021   Loose, teeth 11/19/2021   Malocclusion 11/19/2021   Dental attrition, excessive, limited to enamel 11/19/2021   Defective dental restoration 11/19/2021   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   Abnormal stress test    Atrial fibrillation with RVR ([place]) 08/02/2021   Type 2 diabetes mellitus ([place]) 08/02/2021   Essential hypertension 08/02/2021   Anticoagulated (apixaban)  08/02/2021   Hyperlipidemia associated with type 2 diabetes mellitus ([place]) 08/02/2021   High risk medication use (pioglitazone) 08/02/2021   Intermittent heart palpitations 08/02/2021   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  He has been doing his self manual and using his compression garment. Pt states that his swelling goes down when he wears the compression but is  up in the mornings,however, overall he has noted a big difference.   Pt states that he is doing his self manual and has no questions.     PERTINENT HISTORY: Cancer, heart cath  PAIN:  Are you having pain? No  PRECAUTIONS: Other: cellulitis, difficulty swallowing   WEIGHT BEARING RESTRICTIONS: No  FALLS:  Has patient fallen in last 6 months? No PATIENT GOALS: less swelling    OBJECTIVE:  COGNITION: Overall cognitive status: Within functional limits for tasks assessed   PALPATION: Noted edema   OBSERVATIONS / OTHER ASSESSMENTS: noted swelling in anterior aspect of throat.    UPPER EXTREMITY AROM/PROM: wfl CERVICAL AROM: All within normal limits:     LYMPHEDEMA ASSESSMENTS:   SURGERY TYPE/DATE: laryngoscopy and esophogoscopy on 10/30/2021  NUMBER OF LYMPH NODES REMOVED: unknown    CHEMOTHERAPY: none  RADIATION:ended 01/24/22  HORMONE TREATMENT: none  INFECTIONS: none  LYMPHEDEMA ASSESSMENTS:   LANDMARK   eval 05/30/22 06/13/2022 07/25/2022  Superior neck 45.8 46 45.3 42.3  Mid neck 44.7 43.8 42.8 40.5  Lower neck 39.7 39.7 38.3 36.1  Lt nostril to tragus 9.3 9.1 9.0 8.5  Rt nostril to tragus 9.5 9.3 9.2 8.2  Crown to chin anterior of ear.      (Blank rows = not tested)    TODAY'S TREATMENT:  DATE: 07/25/22:  Re-measured; pt given options for both day and night compression balance.   06/13/2022 Total decongestive techniques completed with education to wife.   05/30/22: Therapist cut foam for compression and given to patient with  instruction to attempt to wear for four hours.  Manual completed to include Deep and surface lymphatic system as well as cervical and facial area.  Therapist instructed pt on self manual for neck and facial area.      PATIENT EDUCATION:  07/25/22:  Pt educated on options for over the counter compression for both day and night wear.  05/23/22 Education on lymphedema, what it is , that it is not curable and how we manage it.  The importance of posture, exercise and beginning of self manual education Education details: HEP, self manual 1-8 for head and neck Person educated: Patient Education method: Explanation Education comprehension: verbalized understanding, returned demonstration, and needs further education  HOME EXERCISE PROGRAM: Access Code: [email or web address] URL: [email or web address] Date: 05/23/2022 Prepared by: Rayetta Humphrey  Exercises - Seated Correct Posture  - 1 x daily - 7 x weekly - 1 sets - 10 reps - 3-5" hold - Seated Cervical Retraction  - 1 x daily - 7 x weekly - 1 sets - 10 reps - 3-5" hold - Seated Scapular Retraction  - 1 x daily - 7 x weekly - 1 sets - 10 reps - 3-5" hold - Seated Cervical Rotation AROM  - 1 x daily - 7 x weekly - 1 sets - 10 reps - 3-5" hold - Seated Cervical Sidebending AROM  - 1 x daily - 7 x weekly - 1 sets - 10 reps - 3-5" hold - Seated Cervical Extension AROM  - 1 x daily - 7 x weekly - 1 sets - 10 reps - 3-5" hold  ASSESSMENT:  CLINICAL IMPRESSION: Pt has been ill and has not been to therapy since February.  Pt states he can tell a big difference and feels like today can be his last day.  All goals have been met therefore pt discharged. .   OBJECTIVE IMPAIRMENTS: increased edema.   ACTIVITY LIMITATIONS: self feeding   REHAB POTENTIAL: Good  CLINICAL DECISION MAKING: Stable/uncomplicated  EVALUATION COMPLEXITY: Moderate  GOALS: Goals reviewed with patient? No  SHORT TERM GOALS: Target date: 06/13/2022  PT to be I in HEP to  improve posture and maintain cervical ROM Baseline: Goal status: met  2.  Pt to be I in self manual techniques to decrease edema by 1-2 cm to ensure normal swallowing  Baseline:  Goal status: MET   LONG TERM GOALS: Target date: 07/04/2022  PT to be wearing compression garment for 3-4 hours a day to prevent build up of edema. Baseline:  Goal status: MET  2.  PT to have lost 2-4 cm of edema in cervical area for self image and improved swallowing  Baseline:  Goal status: MET   PLAN:  PT FREQUENCY: 1x/week PT works M-TH 10 hour shifts   PT DURATION: 6 weeks  PLANNED INTERVENTIONS: Therapeutic exercises, Patient/Family education, Self Care, Manual lymph drainage, Compression bandaging, and Manual therapy  PLAN FOR NEXT SESSION: Discharge.  Rayetta Humphrey PT/CLT

## 2022-07-28 ENCOUNTER — Encounter: Payer: Self-pay | Admitting: Cardiology

## 2022-07-28 ENCOUNTER — Ambulatory Visit: Payer: BC Managed Care – PPO | Attending: Cardiology | Admitting: Cardiology

## 2022-07-28 VITALS — BP 108/76 | HR 61 | Ht 71.5 in | Wt 164.0 lb

## 2022-07-28 DIAGNOSIS — I493 Ventricular premature depolarization: Secondary | ICD-10-CM | POA: Diagnosis not present

## 2022-07-28 DIAGNOSIS — Z7901 Long term (current) use of anticoagulants: Secondary | ICD-10-CM

## 2022-07-28 DIAGNOSIS — I2510 Atherosclerotic heart disease of native coronary artery without angina pectoris: Secondary | ICD-10-CM | POA: Diagnosis not present

## 2022-07-28 DIAGNOSIS — I480 Paroxysmal atrial fibrillation: Secondary | ICD-10-CM

## 2022-07-28 DIAGNOSIS — I5022 Chronic systolic (congestive) heart failure: Secondary | ICD-10-CM | POA: Diagnosis not present

## 2022-07-28 NOTE — Patient Instructions (Signed)
Medication Instructions:  Your physician recommends that you continue on your current medications as directed. Please refer to the Current Medication list given to you today.  *If you need a refill on your cardiac medications before your next appointment, please call your pharmacy*   Lab Work: None If you have labs (blood work) drawn today and your tests are completely normal, you will receive your results only by: [place] (if you have MyChart) OR A paper copy in the mail If you have any lab test that is abnormal or we need to change your treatment, we will call you to review the results.   Testing/Procedures: Your physician has requested that you have an echocardiogram. Echocardiography is a painless test that uses sound waves to create images of your heart. It provides your doctor with information about the size and shape of your heart and how well your heart's chambers and valves are working. This procedure takes approximately one hour. There are no restrictions for this procedure. Please do NOT wear cologne, perfume, aftershave, or lotions (deodorant is allowed). Please arrive 15 minutes prior to your appointment time.    Follow-Up: At [facility], you and your health needs are our priority.  As part of our continuing mission to provide you with exceptional heart care, we have created designated Provider Care Teams.  These Care Teams include your primary Cardiologist (physician) and Advanced Practice Providers (APPs -  Physician Assistants and Nurse Practitioners) who all work together to provide you with the care you need, when you need it.  We recommend signing up for the patient portal called "MyChart".  Sign up information is provided on this After Visit Summary.  MyChart is used to connect with patients for Virtual Visits (Telemedicine).  Patients are able to view lab/test results, encounter notes, upcoming appointments, etc.  Non-urgent messages can be sent to your  provider as well.   To learn more about what you can do with MyChart, go to [email or web address].    Your next appointment:   6 month(s)  Provider:   Carlyle Dolly, MD    Other Instructions

## 2022-07-28 NOTE — Progress Notes (Signed)
Clinical Summary Trevor Steele is a 62 y.o.male seen today for follow up of the following medical problems.    1.PAF - diagnosed 06/2021 - multiple ER visits 2023 with afib with RVR - seen by EP, started on flecanide  - rare infrequent palpitations - compliant with meds. No bleeding on eliquis    2. HTN - compliant with meds   3. Hyperlipidemia - 09/2021 TC 112 TG 154 HDL 27 LDL 58 - he is compliant with meds   4. CAD - 10/2021 cath mild to moderate nonobstructive CAD   5. 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   Aortic atherosclerosis (HCC)    Aortic stenosis    COVID 06/28/2021   no symptoms, went to hospital with a-fib and diagnosed then   Diabetes mellitus without complication ([place])    Dysrhythmia    A-fib   GERD (gastroesophageal reflux disease)    High cholesterol    History of kidney stones    Hypertension    Mild dilation of ascending aorta ([place])    by echo 07/2021 though not corroborated on CT   Mitral regurgitation    Neck mass    Abnormal neck CT 07/2021 concerning for nodal metastasis   PAF (paroxysmal atrial fibrillation) (HCC)    Pneumonia    Pulmonary nodules    PVC's (premature ventricular contractions)      No Known Allergies   Current Outpatient Medications  Medication Sig Dispense Refill   apixaban (ELIQUIS) 5 MG TABS tablet Take 5 mg by mouth 2 (two) times daily.     atorvastatin (LIPITOR) 40 MG tablet TAKE 1 TABLET(40 MG) BY MOUTH DAILY 90 tablet 0   diltiazem (CARDIZEM) 30  MG tablet Take 1 tablet (30 mg total) by mouth every 6 (six) hours as needed (palpitations). 30 tablet 1   empagliflozin (JARDIANCE) 10 MG TABS tablet Take 1 tablet (10 mg total) by mouth daily before breakfast. 90 tablet 3   esomeprazole (NEXIUM) 40 MG capsule Take 40 mg by mouth daily.  12   flecainide (TAMBOCOR) 50 MG tablet Take 1 tablet (50 mg total) by mouth 2 (two) times daily. 60 tablet 11   glimepiride (AMARYL) 1 MG tablet Take 1 mg by mouth every evening.     HYDROcodone-acetaminophen (HYCET) 7.5-325 mg/15 ml solution Take 10 mLs by mouth 3 (three) times daily as needed for moderate pain. Take with food. 150 mL 0   lidocaine (XYLOCAINE) 2 % solution Patient: Mix 1part 2% viscous lidocaine, 1part H20. Swish & swallow 44mL of diluted mixture, 21min before meals and at bedtime, up to QID 150 mL 5   lisinopril (ZESTRIL) 10 MG tablet Take 1 tablet (10 mg total) by mouth daily. 30 tablet 1   metFORMIN (GLUCOPHAGE) 1000 MG tablet Take 1,000 mg by mouth 2 (two) times daily.  12   metoprolol succinate (TOPROL-XL) 50 MG 24 hr tablet Take 1 tablet (50 mg total) by mouth in the morning and at bedtime.  Take with or immediately following a meal. 60 tablet 0   Multiple Vitamin (MULTIVITAMIN WITH MINERALS) TABS tablet Take 1 tablet by mouth daily.     temazepam (RESTORIL) 22.5 MG capsule Take 1 capsule (22.5 mg total) by mouth at bedtime as needed for sleep. 7 capsule 0   Current Facility-Administered Medications  Medication Dose Route Frequency Provider Last Rate Last Admin   sodium chloride flush (NS) 0.9 % injection 3 mL  3 mL Intravenous Q12H Imogene Burn, PA-C         Past Surgical History:  Procedure Laterality Date   BACK SURGERY     BACK SURGERY     lumbar   LARYNGOSCOPY AND ESOPHAGOSCOPY N/A 10/30/2021   Procedure: DIRECT LARYNGOSCOPY WITH BIOPSY; ESOPHAGOSCOPY;  Surgeon: [name], MD;  Location: [facility];  Service: ENT;  Laterality: N/A;   LEFT HEART CATH AND CORONARY ANGIOGRAPHY  N/A 10/09/2021   Procedure: LEFT HEART CATH AND CORONARY ANGIOGRAPHY;  Surgeon: Burnell Blanks, MD;  Location: [facility];  Service: Cardiovascular;  Laterality: N/A;   TONSILLECTOMY Bilateral 10/30/2021   Procedure: TONSILLECTOMY; FROZEN SECTION;  Surgeon: [name], MD;  Location: [facility];  Service: ENT;  Laterality: Bilateral;     No Known Allergies    Family History  Problem Relation Age of Onset   Coronary artery disease Mother    Coronary artery disease Father    Mesothelioma Father    CAD Neg Hx        negative for premature CAD     Social History Mr. Windhorst reports that he has been smoking cigarettes. He has been smoking an average of .5 packs per day. He does not have any smokeless tobacco history on file. Mr. Pana reports that he does not currently use alcohol.   Review of Systems CONSTITUTIONAL: No weight loss, fever, chills, weakness or fatigue.  HEENT: Eyes: No visual loss, blurred vision, double vision or yellow sclerae.No hearing loss, sneezing, congestion, runny nose or sore throat.  SKIN: No rash or itching.  CARDIOVASCULAR: per hpi RESPIRATORY: No shortness of breath, cough or sputum.  GASTROINTESTINAL: No anorexia, nausea, vomiting or diarrhea. No abdominal pain or blood.  GENITOURINARY: No burning on urination, no polyuria NEUROLOGICAL: No headache, dizziness, syncope, paralysis, ataxia, numbness or tingling in the extremities. No change in bowel or bladder control.  MUSCULOSKELETAL: No muscle, back pain, joint pain or stiffness.  LYMPHATICS: No enlarged nodes. No history of splenectomy.  PSYCHIATRIC: No history of depression or anxiety.  ENDOCRINOLOGIC: No reports of sweating, cold or heat intolerance. No polyuria or polydipsia.  Marland Kitchen   Physical Examination Today's Vitals   07/28/22 1048  BP: 108/76  Pulse: 61  SpO2: 100%  Weight: 164 lb (74.4 kg)  Height: 5' 11.5" (1.816 m)   Body mass index is 22.55 kg/m.  Gen: resting  comfortably, no acute distress HEENT: no scleral icterus, pupils equal round and reactive, no palptable cervical adenopathy,  CV: RRR, 2/6 systolic murmur rusb, no jvd Resp: Clear to auscultation bilaterally GI: abdomen is soft, non-tender, non-distended, normal bowel sounds, no hepatosplenomegaly MSK: extremities are warm, no edema.  Skin: warm, no rash Neuro:  no focal deficits Psych: appropriate affect   Diagnostic Studies  10/2021 nuclear stress Poor quality study, significant extracardiac activity Severe LV systolic dysfunction, EF [id number].  Patient was having frequent PVCs however which can affect gating Fixed inferior perfusion defect with abnormal wall motion consistent with prior infarct Partially reversible anterior perfusion defect with  abnormal wall motion suggesting infarct with peri-infarct ischemia High risk study  07/2021 echo 1. Left ventricular ejection fraction, by estimation, is 45 to 50%. The  left ventricle has mildly decreased function. The left ventricle  demonstrates global hypokinesis. Left ventricular diastolic parameters are  consistent with Grade I diastolic  dysfunction (impaired relaxation).   2. Right ventricular systolic function is mildly reduced. The right  ventricular size is mildly enlarged. Tricuspid regurgitation signal is  inadequate for assessing PA pressure.   3. Left atrial size was mildly dilated.   4. The mitral valve is normal in structure. Mild mitral valve  regurgitation. No evidence of mitral stenosis.   5. The aortic valve is normal in structure. Aortic valve regurgitation is  mild. Mild to moderate aortic valve stenosis. Aortic valve area, by VTI  measures 1.73 cm. Aortic valve mean gradient measures 18.0 mmHg. Aortic  valve Vmax measures 2.82 m/s.   6. There is mild dilatation of the ascending aorta, measuring 38 mm.   7. The inferior vena cava is normal in size with greater than 50%  respiratory variability, suggesting right atrial  pressure of 3 mmHg.     11/2021 echo 1. Wall motion assessment/LVEF challenging with frequent PVCs.. Left  ventricular ejection fraction, by estimation, is 40 to 45%. The left  ventricle has mildly decreased function. The left ventricle demonstrates  global hypokinesis. The left ventricular  internal cavity size was mildly dilated. Left ventricular diastolic  parameters are indeterminate.   2. Right ventricular systolic function is mildly reduced. The right  ventricular size is mildly enlarged. There is normal pulmonary artery  systolic pressure.   3. The mitral valve is normal in structure. No evidence of mitral valve  regurgitation.   4. The aortic valve is calcified. Aortic valve regurgitation is trivial.  Mild to moderate aortic valve stenosis.   5. The inferior vena cava is normal in size with greater than 50%  respiratory variability, suggesting right atrial pressure of 3 mmHg.   10/2021 cath Prox RCA lesion is 30% stenosed.   Prox Cx to Mid Cx lesion is 20% stenosed.   Mid LAD-1 lesion is 50% stenosed.   Mid LAD-2 lesion is 40% stenosed.   Dist LAD lesion is 30% stenosed.   Moderate non-obstructive mid LAD stenosis. This does not appear to be flow limiting.  Mild non-obstructive disease in the Circumflex and RCA   Recommendations: medical management of non-obstructive CAD  Assessment and Plan  1.Afib/acquired thrombophilia - no symptoms - repeat echo, if ongoing LV dysfunction have him f/u back with EP to discuss flecanide - continue eliquis for stroke prevention  2. Nonobstructive CAD - no symptoms, conitinue medical therapy  3. HFmrEF - repeat echo, if ongoing dysfunction consider entresto and aldactone  4. PVCs - no symptoms - f/u echo, may need to consider alterantive to flecanide pending LVEF  -EKG today shows SR, 2 PVCs  5. Hyperlipidemia - at goal, continue current meds  6. Aortic stenosis - mild to moderate, conitnue to monitor   Arnoldo Lenis, M.D.,

## 2022-08-01 ENCOUNTER — Encounter (HOSPITAL_COMMUNITY): Payer: BC Managed Care – PPO | Admitting: Physical Therapy

## 2022-08-10 ENCOUNTER — Other Ambulatory Visit: Payer: Self-pay | Admitting: Internal Medicine

## 2022-08-11 NOTE — Telephone Encounter (Signed)
Rx refill sent to pharmacy. 

## 2022-08-29 ENCOUNTER — Ambulatory Visit (HOSPITAL_COMMUNITY)
Admit: 2022-08-29 | Discharge: 2022-08-29 | Disposition: A | Discharge status: Home / Self Care | Payer: BC Managed Care – PPO | Attending: Cardiology | Admitting: Cardiology

## 2022-08-29 DIAGNOSIS — I350 Nonrheumatic aortic (valve) stenosis: Secondary | ICD-10-CM | POA: Diagnosis not present

## 2022-08-29 DIAGNOSIS — I5022 Chronic systolic (congestive) heart failure: Secondary | ICD-10-CM

## 2022-08-29 LAB — ECHOCARDIOGRAM COMPLETE
AR max vel: 1.25 cm2
AV Area VTI: 1.29 cm2
AV Area mean vel: 1.45 cm2
AV Mean grad: 12 mmHg
AV Peak grad: 27.5 mmHg
Ao pk vel: 2.62 m/s
Area-P 1/2: 3.77 cm2
Calc EF: 47.9 %
S' Lateral: 4 cm
Single Plane A2C EF: 47 %
Single Plane A4C EF: 45.7 %

## 2022-08-29 NOTE — Progress Notes (Signed)
*  PRELIMINARY RESULTS* Echocardiogram 2D Echocardiogram has been performed.  Stacey Drain 08/29/2022, 11:42 AM

## 2022-10-03 DIAGNOSIS — I480 Paroxysmal atrial fibrillation: Secondary | ICD-10-CM | POA: Diagnosis not present

## 2022-10-03 DIAGNOSIS — E118 Type 2 diabetes mellitus with unspecified complications: Secondary | ICD-10-CM | POA: Diagnosis not present

## 2022-10-03 DIAGNOSIS — Z79899 Other long term (current) drug therapy: Secondary | ICD-10-CM | POA: Diagnosis not present

## 2022-10-03 DIAGNOSIS — C029 Malignant neoplasm of tongue, unspecified: Secondary | ICD-10-CM | POA: Diagnosis not present

## 2022-10-06 ENCOUNTER — Telehealth: Payer: Self-pay | Admitting: *Deleted

## 2022-10-06 NOTE — Telephone Encounter (Signed)
CALLED PATIENT TO REMIND OF LAB AND FU APPT. FOR 10-07-22, LVM FOR A RETURN CALL

## 2022-10-07 ENCOUNTER — Ambulatory Visit
Admission: RE | Admit: 2022-10-07 | Discharge: 2022-10-07 | Disposition: A | Discharge status: Home / Self Care | Payer: BC Managed Care – PPO | Source: Ambulatory Visit | Attending: Radiation Oncology | Admitting: Radiation Oncology

## 2022-10-07 ENCOUNTER — Encounter: Payer: Self-pay | Admitting: Radiation Oncology

## 2022-10-07 VITALS — BP 118/69 | HR 70 | Temp 97.8°F | Resp 18 | Ht 70.5 in | Wt 159.8 lb

## 2022-10-07 DIAGNOSIS — Z79899 Other long term (current) drug therapy: Secondary | ICD-10-CM | POA: Insufficient documentation

## 2022-10-07 DIAGNOSIS — E119 Type 2 diabetes mellitus without complications: Secondary | ICD-10-CM | POA: Insufficient documentation

## 2022-10-07 DIAGNOSIS — Z7901 Long term (current) use of anticoagulants: Secondary | ICD-10-CM | POA: Insufficient documentation

## 2022-10-07 DIAGNOSIS — Z7984 Long term (current) use of oral hypoglycemic drugs: Secondary | ICD-10-CM | POA: Insufficient documentation

## 2022-10-07 DIAGNOSIS — Z923 Personal history of irradiation: Secondary | ICD-10-CM | POA: Diagnosis not present

## 2022-10-07 DIAGNOSIS — C01 Malignant neoplasm of base of tongue: Secondary | ICD-10-CM | POA: Insufficient documentation

## 2022-10-07 DIAGNOSIS — F17210 Nicotine dependence, cigarettes, uncomplicated: Secondary | ICD-10-CM | POA: Diagnosis not present

## 2022-10-07 DIAGNOSIS — Z1329 Encounter for screening for other suspected endocrine disorder: Secondary | ICD-10-CM

## 2022-10-07 LAB — TSH: TSH: 0.826 u[IU]/mL (ref 0.350–4.500)

## 2022-10-07 NOTE — Progress Notes (Signed)
Radiation Oncology         [phone] ________________________________  Name: Trevor Steele MRN: [id number]  Date: 10/07/2022  DOB: Apr 19, 1961  Follow-Up Visit Note  CC: Trevor Perches, MD  Trevor Perches, MD  Diagnosis and Prior Radiotherapy:       ICD-10-CM   1. Malignant neoplasm of base of tongue (HCC)  C01 CT Chest W Contrast    CT Soft Tissue Neck W Contrast    CANCELED: CT Chest Wo Contrast    CANCELED: CT Soft Tissue Neck Wo Contrast      Cancer Staging  Malignant neoplasm of base of tongue (HCC) Staging form: Pharynx - HPV-Mediated Oropharynx, AJCC 8th Edition - Clinical stage from 11/06/2021: Stage I (cT2, cN1, cM0, p16+) - Signed by Trevor Peak, MD on 11/06/2021 Stage prefix: Initial diagnosis   Radiation Treatment Dates: 12/05/2021 through 01/24/2022 Site Technique Total Dose (Gy) Dose per Fx (Gy) Completed Fx Beam Energies  Neck: HN_BOT IMRT 70/70 2 35/35 6X    CHIEF COMPLAINT:  Here for follow-up and surveillance of throat cancer  Narrative:  The patient returns today for routine follow-up.  Trevor Steele presents today for follow-up after completing radiation to his base of tongue on 01/24/2022.  Pain issues, if any: none  Using a feeding tube?: NA Weight changes, if any: Patient states he is maintaining his weight.  Wt Readings from Last 3 Encounters:  10/07/22 159 lb 12.8 oz (72.5 kg)  07/28/22 164 lb (74.4 kg)  04/22/22 177 lb 12.8 oz (80.6 kg)  Swallowing issues, if any: yes,sometime Smoking or chewing tobacco? na Using fluoride toothpaste daily? yes Last ENT visit was on: In March 2024 States that he has an appointment on Friday. Other notable issues, if any: na   Dr. Pollyann Steele on 07-11-22, no scope performed Physical Exam:   Healthy-appearing gentleman no distress. Breathing and voice are clear. Nasal exam is unremarkable. Oral cavity and pharynx reveals remaining dentition in good shape. Mucous membranes are relatively moist. Tongue floor of mouth buccal  mucosa, oropharyngeal mucosa all look healthy and clear. No asymmetry. Indirect exam of the tongue base, hypopharynx and larynx are all clear without any mucosal lesions identified. There is some supraglottic tissue edema as expected. No palpable lymphadenopathy.  Independent Review of Additional Tests or Records:  none  Procedures:  none  Impression & Plans:  Stable, no evidence of recurrent disease. Recheck 3 months or sooner as needed.                ALLERGIES:  has No Known Allergies.  Meds: Current Outpatient Medications  Medication Sig Dispense Refill   apixaban (ELIQUIS) 5 MG TABS tablet Take 5 mg by mouth 2 (two) times daily.     atorvastatin (LIPITOR) 40 MG tablet TAKE 1 TABLET(40 MG) BY MOUTH DAILY 90 tablet 0   empagliflozin (JARDIANCE) 10 MG TABS tablet Take 1 tablet (10 mg total) by mouth daily before breakfast. 90 tablet 3   esomeprazole (NEXIUM) 40 MG capsule Take 40 mg by mouth daily.  12   glimepiride (AMARYL) 1 MG tablet Take 1 mg by mouth every evening.     lisinopril (ZESTRIL) 10 MG tablet Take 1 tablet (10 mg total) by mouth daily. 30 tablet 1   metFORMIN (GLUCOPHAGE) 1000 MG tablet Take 1,000 mg by mouth 2 (two) times daily.  12   metoprolol succinate (TOPROL-XL) 50 MG 24 hr tablet Take 1 tablet (50 mg total) by mouth in the morning and at bedtime. Take  with or immediately following a meal. 60 tablet 0   Multiple Vitamin (MULTIVITAMIN WITH MINERALS) TABS tablet Take 1 tablet by mouth daily.     diltiazem (CARDIZEM) 30 MG tablet Take 1 tablet (30 mg total) by mouth every 6 (six) hours as needed (palpitations). 30 tablet 1   flecainide (TAMBOCOR) 50 MG tablet Take 1 tablet (50 mg total) by mouth 2 (two) times daily. (Patient not taking: Reported on 10/07/2022) 60 tablet 11   HYDROcodone-acetaminophen (HYCET) 7.5-325 mg/15 ml solution Take 10 mLs by mouth 3 (three) times daily as needed for moderate pain. Take with food. 150 mL 0   lidocaine (XYLOCAINE) 2 % solution  Patient: Mix 1part 2% viscous lidocaine, 1part H20. Swish & swallow 10mL of diluted mixture, before meals and at bedtime, up to QID 150 mL 5   temazepam (RESTORIL) 22.5 MG capsule Take 1 capsule (22.5 mg total) by mouth at bedtime as needed for sleep. 7 capsule 0   Current Facility-Administered Medications  Medication Dose Route Frequency Provider Last Rate Last Admin   sodium chloride flush (NS) 0.9 % injection 3 mL  3 mL Intravenous Q12H Trevor Kief, PA-C        Physical Findings:  The patient is in no acute distress. Patient is alert and oriented. Wt Readings from Last 3 Encounters:  10/07/22 159 lb 12.8 oz (72.5 kg)  07/28/22 164 lb (74.4 kg)  04/22/22 177 lb 12.8 oz (80.6 kg)    height is 5' 10.5" (1.791 m) and weight is 159 lb 12.8 oz (72.5 kg). His temperature is 97.8 F (36.6 C). His blood pressure is 118/69 and his pulse is 70. His respiration is 18 and oxygen saturation is 100%. .  General: Alert and oriented, in no acute distress HEENT: Head is normocephalic. Extraocular movements are intact. Oropharynx is notable for no lesions in upper throat Neck: Neck is notable for healing skin , skin intact; no obvious masses Cardiovascular: heart is regular in rate and rhythm, with no murmurs, rubs, or gallops. No edema noted.  Pulmonary: lungs are clear to auscultation in all fields Skin: Skin in treatment fields shows satisfactory healing  Lymphatics: see Neck Exam Musculoskeletal: Symmetric muscle strength throughout.  Psychiatric: Judgment and insight are intact. Affect is appropriate.   Lab Findings: Lab Results  Component Value Date   WBC 10.0 10/08/2021   HGB 14.7 10/08/2021   HCT 43.7 10/08/2021   MCV 92 10/08/2021   PLT 234 10/08/2021    Lab Results  Component Value Date   TSH 0.826 10/07/2022    Radiographic Findings: No results found.  Impression/Plan:    1) Head and Neck Cancer Status: NED - he declined laryngoscopy as he is following closely w/  ENT q 59mo.    2) Nutritional Status:  He continues to lose weight. I encouraged him to increase his caloric intake. He does have type 2 diabetes, so we discussed high protein, low carbohydrate options.  Wt Readings from Last 3 Encounters:  10/07/22 159 lb 12.8 oz (72.5 kg)  07/28/22 164 lb (74.4 kg)  04/22/22 177 lb 12.8 oz (80.6 kg)  PEG tube: none  3) Risk Factors: The patient has been educated about risk factors including alcohol and tobacco abuse; they understand that avoidance of alcohol and tobacco is important to prevent recurrences as well as other cancers - abstaining from smoking   4) Swallowing: functional, continues his exercises.   5) Dental: Encouraged to continue regular followup with dentistry, and dental  hygiene including fluoride rinses.   6) Thyroid function:  TSH obtained today. Results pending. We will call to let him know if his results are abnormal.  Lab Results  Component Value Date   TSH 0.826 10/07/2022    7) Patient is scheduled for f/up with Dr. Pollyann Steele on Friday. He is unable to make this due to a funeral for a close friend passing away. He states he will call Dr. Lucky Rathke office and reschedule this appointment for a scope.   Routine follow-up with me in 6 months. CT of neck and chest scheduled prior to this appointment. Patient knows to call with any questions or concerns in the meantime.   On date of service, in total, I spent 20 minutes on this encounter. Patient was seen in person. Note signed after encounter date; minutes pertain to date of service, only.  _____________________________________   Joyice Faster, PA-C    Trevor Peak, MD

## 2022-10-07 NOTE — Progress Notes (Signed)
Mr. Blasingame is here following completion of treatment for tongue cancer. He completed radiation treatment on 01-24-22.   Pain issues, if any: none  Using a feeding tube?: NA Weight changes, if any: States that he is maintaining  his wt. Swallowing issues, if any: yes,sometime Smoking or chewing tobacco? na Using fluoride toothpaste daily? yes Last ENT visit was on: In March 2024 States that he has an appointment on Friday. Other notable issues, if any: na  Dr. Pollyann Kennedy on 07-11-22, no scope performed Physical Exam:   Healthy-appearing gentleman no distress. Breathing and voice are clear. Nasal exam is unremarkable. Oral cavity and pharynx reveals remaining dentition in good shape. Mucous membranes are relatively moist. Tongue floor of mouth buccal mucosa, oropharyngeal mucosa all look healthy and clear. No asymmetry. Indirect exam of the tongue base, hypopharynx and larynx are all clear without any mucosal lesions identified. There is some supraglottic tissue edema as expected. No palpable lymphadenopathy.  Independent Review of Additional Tests or Records:  none  Procedures:  none  Impression & Plans:  Stable, no evidence of recurrent disease. Recheck 3 months or sooner as needed.   Vitals:   10/07/22 1432  BP: 118/69  Pulse: 70  Resp: 18  Temp: 97.8 F (36.6 C)  SpO2: 100%  Weight: 72.5 kg  Height: 5' 10.5" (1.791 m)

## 2022-10-08 ENCOUNTER — Encounter: Payer: Self-pay | Admitting: Radiation Oncology

## 2022-10-10 ENCOUNTER — Other Ambulatory Visit: Payer: Self-pay | Admitting: Internal Medicine

## 2022-10-10 DIAGNOSIS — I5020 Unspecified systolic (congestive) heart failure: Secondary | ICD-10-CM | POA: Diagnosis not present

## 2022-10-10 DIAGNOSIS — E785 Hyperlipidemia, unspecified: Secondary | ICD-10-CM | POA: Diagnosis not present

## 2022-10-10 DIAGNOSIS — I480 Paroxysmal atrial fibrillation: Secondary | ICD-10-CM | POA: Diagnosis not present

## 2022-10-10 DIAGNOSIS — E1169 Type 2 diabetes mellitus with other specified complication: Secondary | ICD-10-CM | POA: Diagnosis not present

## 2022-10-14 ENCOUNTER — Other Ambulatory Visit: Payer: Self-pay | Admitting: Internal Medicine

## 2022-11-10 NOTE — Therapy (Signed)
[place] [facility] [address] [place], [place], [address] Phone: [phone]   Fax:  [phone]  Patient Details  Name: Trevor Steele MRN: [id number] Date of Birth: May 28, 1960 Referring Provider:  Lonie Peak, MD  Encounter Date: 11/10/2022  SPEECH THERAPY DISCHARGE SUMMARY  Visits from Start of Care: 2  Current functional level related to goals / functional outcomes: Pt had his last scheduled appointment scheduled on 02-24-22 but called to cancel. He did not wish to reschedule.  Goals and plan from his last attended ST on 01/23/22 are below: CLINICAL IMPRESSION: Patient is a 62 y.o. male who was seen today for treatment of swallowing as they undergo radiation therapy. Today pt drank thin liquids without overt s/s oral or pharyngeal difficulty. At this time pt swallowing is deemed WNL/WFL with these POs. No oral or overt s/sx pharyngeal deficits, including aspiration were observed. There are no overt s/s aspiration PNA observed by SLP nor any reported by pt at this time. Data indicate that pt's swallow ability will likely decrease over the course of radiation/chemoradiation therapy and could very well decline over time following the conclusion of that therapy due to muscle disuse atrophy and/or muscle fibrosis. Pt will cont to need to be seen by SLP in order to assess safety of PO intake, assess the need for recommending any objective swallow assessment, and ensuring pt is correctly completing the individualized HEP.   OBJECTIVE IMPAIRMENTS include dysphagia. These impairments are limiting patient from safety when swallowing. Factors affecting potential to achieve goals and functional outcome are  none . Patient will benefit from skilled SLP services to address above impairments and improve overall function.   REHAB POTENTIAL: Good     GOALS: Goals reviewed with patient? Yes   SHORT TERM GOALS: Target :  4th visit      pt  will complete HEP with rare min A  Baseline: Goal status: Ongoing   2.  pt will tell SLP why pt is completing HEP with modified independence Baseline:  Goal status: Met   3.  pt will describe 3 overt s/s aspiration PNA with modified independence Baseline:  Goal status: Ongoing   4.  pt will tell SLP how a food journal could hasten return to a more normalized diet Baseline:  Goal status: Met     LONG TERM GOALS: Target :  7th visit      pt will complete HEP with modified independence over two visits Baseline:  Goal status: Ongoing   2.  pt will describe how to modify HEP over time, and the timeline associated with reduction in HEP frequency with modified independence over two sessions Baseline:  Goal status: Ongoing   Remaining deficits: Assumed that deficits remain, but unsure as this was an unexpected d/c.    Education / Equipment: See "today's treatment" section in ST notes for details.    Patient agrees to discharge. Patient goals were partially met. Patient is being discharged due to not returning since the last visit.Marland Kitchen    [facility], CCC-SLP 11/10/2022, 2:53 PM  [place] [facility] [address], [address] [place], [place], [address] Phone: [phone]   Fax:  [phone]

## 2022-11-25 DIAGNOSIS — Z923 Personal history of irradiation: Secondary | ICD-10-CM | POA: Diagnosis not present

## 2022-11-25 DIAGNOSIS — C109 Malignant neoplasm of oropharynx, unspecified: Secondary | ICD-10-CM | POA: Diagnosis not present

## 2022-12-16 ENCOUNTER — Ambulatory Visit (INDEPENDENT_AMBULATORY_CARE_PROVIDER_SITE_OTHER): Payer: BC Managed Care – PPO | Admitting: Orthopaedic Surgery

## 2022-12-16 ENCOUNTER — Other Ambulatory Visit (INDEPENDENT_AMBULATORY_CARE_PROVIDER_SITE_OTHER): Payer: BC Managed Care – PPO

## 2022-12-16 ENCOUNTER — Encounter: Payer: Self-pay | Admitting: Orthopaedic Surgery

## 2022-12-16 VITALS — BP 134/75 | HR 62 | Ht 71.0 in | Wt 164.4 lb

## 2022-12-16 DIAGNOSIS — M25532 Pain in left wrist: Secondary | ICD-10-CM

## 2022-12-16 DIAGNOSIS — M654 Radial styloid tenosynovitis [de Quervain]: Secondary | ICD-10-CM | POA: Diagnosis not present

## 2022-12-16 MED ORDER — HYDROCODONE-ACETAMINOPHEN 7.5-325 MG PO TABS: 1.0000 | ORAL_TABLET | ORAL | 0 refills | Status: AC | PRN

## 2022-12-16 MED ORDER — METHYLPREDNISOLONE ACETATE 40 MG/ML IJ SUSP
40.0000 mg | Freq: Once | INTRAMUSCULAR | Status: AC
  Administered 2022-12-16: 40 mg via INTRA_ARTICULAR

## 2022-12-16 NOTE — Progress Notes (Signed)
My wrist is hurting.  He has pain in the left dorsal wrist over the thumb area.  It has been present for many months.  He had had medical issues over the last several months.  He has atrial fib and is on Eliquis.  He has had other issues.  He noticed the thumb and left wrist hurting but could not come in because of his other issues.  He has pain lifting objects or twisting a wash cloth. He has no redness or swelling.  He has no trauma.  He is tender over the first extensor compartment on the left with positive Finkelstein sign.  NV intact. ROM is full. There is no swelling or redness.  X-rays of the left wrist were done, reported separately.  Encounter Diagnoses  Name Primary?   Pain in left wrist Yes   Radial styloid tenosynovitis (de quervain)    Procedure note:  After permission from the patient and prep of the left wrist first extensor compartment area, I injected 1 % Xylocaine and 1 cc DepoMedrol by sterile technique tolerated well.  I have given a thumb splint.  I have reviewed the [place] Controlled Substance Reporting System web site prior to prescribing narcotic medicine for this patient.  Call if any problem.  Precautions discussed.  Electronically Signed Darreld Mclean, MD 8/13/20248:36 AM

## 2022-12-16 NOTE — Addendum Note (Signed)
Addended byCaffie Damme on: 12/16/2022 03:15 PM   Modules accepted: Orders

## 2022-12-30 ENCOUNTER — Telehealth: Payer: Self-pay | Admitting: Orthopaedic Surgery

## 2022-12-30 MED ORDER — HYDROCODONE-ACETAMINOPHEN 7.5-325 MG PO TABS: 1.0000 | ORAL_TABLET | Freq: Four times a day (QID) | ORAL | 0 refills | Status: AC | PRN

## 2022-12-30 NOTE — Telephone Encounter (Signed)
Dr. Sanjuan Steele pt - spoke w/the pt, he is requesting a refill for Hydrocodone 7.5-325 to be sent to [facility] on [address]

## 2023-01-06 ENCOUNTER — Encounter: Payer: Self-pay | Admitting: Orthopaedic Surgery

## 2023-01-06 ENCOUNTER — Ambulatory Visit (INDEPENDENT_AMBULATORY_CARE_PROVIDER_SITE_OTHER): Payer: BC Managed Care – PPO | Admitting: Orthopaedic Surgery

## 2023-01-06 VITALS — BP 129/83 | HR 60 | Ht 71.0 in | Wt 164.0 lb

## 2023-01-06 DIAGNOSIS — M654 Radial styloid tenosynovitis [de Quervain]: Secondary | ICD-10-CM | POA: Diagnosis not present

## 2023-01-06 NOTE — Progress Notes (Signed)
I am a little better.  He has less pain of the left Berline Lopes since the injection last time.  He is active.  He is wearing a splint.  He has no new trauma.  ROM of the left thumb is good.  He has some tenderness of the first extensor compartment but negative Lourena Simmonds today.  NV intact.  Encounter Diagnosis  Name Primary?   Radial styloid tenosynovitis (de quervain) Yes   Continue as he is doing.  Add Aspercreme, Biofreeze or voltaren gel rubs as needed.  Return in one month.  Call if any problem.  Precautions discussed.  Electronically Signed Darreld Mclean, MD 9/3/20248:28 AM

## 2023-01-28 ENCOUNTER — Telehealth: Payer: Self-pay | Admitting: Orthopaedic Surgery

## 2023-01-28 NOTE — Telephone Encounter (Signed)
Dr. Sanjuan Dame pt - pt lvm requesting a refill on Hydrocodone 7.5-325 to be sent to [facility] on [address]

## 2023-01-30 MED ORDER — HYDROCODONE-ACETAMINOPHEN 7.5-325 MG PO TABS: 1.0000 | ORAL_TABLET | Freq: Four times a day (QID) | ORAL | 0 refills | Status: DC | PRN

## 2023-02-03 ENCOUNTER — Ambulatory Visit (INDEPENDENT_AMBULATORY_CARE_PROVIDER_SITE_OTHER): Payer: BC Managed Care – PPO | Admitting: Orthopaedic Surgery

## 2023-02-03 ENCOUNTER — Encounter: Payer: Self-pay | Admitting: Orthopaedic Surgery

## 2023-02-03 VITALS — BP 168/86 | HR 59

## 2023-02-03 DIAGNOSIS — M654 Radial styloid tenosynovitis [de Quervain]: Secondary | ICD-10-CM | POA: Diagnosis not present

## 2023-02-03 MED ORDER — METHYLPREDNISOLONE ACETATE 40 MG/ML IJ SUSP
40.0000 mg | Freq: Once | INTRAMUSCULAR | Status: AC
  Administered 2023-02-03: 40 mg via INTRA_ARTICULAR

## 2023-02-03 NOTE — Progress Notes (Signed)
My thumb is hurting again.  He has recurrence of his Suzette Battiest on the left.  He has used ice and rubs with little help.  He has a splint that helps.  He has no new trauma.  He has positive Lourena Simmonds on the left and pain in the first extensor compartment.  NV intact.  There is no redness.    Encounter Diagnosis  Name Primary?   Radial styloid tenosynovitis (de quervain) Yes   Procedure note: After permission from the patient, the first extensor compartment of the left wrist was prepped.  I instilled 1 % Xylocaine and 1 cc DepoMedrol 40 into the tendon sheath by sterile technique tolerated well.  Return in one month.  Call if any problem.  Precautions discussed.  Electronically Signed Darreld Mclean, MD 10/1/20248:13 AM

## 2023-02-06 DIAGNOSIS — E118 Type 2 diabetes mellitus with unspecified complications: Secondary | ICD-10-CM | POA: Diagnosis not present

## 2023-02-13 DIAGNOSIS — Z23 Encounter for immunization: Secondary | ICD-10-CM | POA: Diagnosis not present

## 2023-02-13 DIAGNOSIS — Z85810 Personal history of malignant neoplasm of tongue: Secondary | ICD-10-CM | POA: Diagnosis not present

## 2023-02-13 DIAGNOSIS — R7309 Other abnormal glucose: Secondary | ICD-10-CM | POA: Diagnosis not present

## 2023-02-13 DIAGNOSIS — E1169 Type 2 diabetes mellitus with other specified complication: Secondary | ICD-10-CM | POA: Diagnosis not present

## 2023-02-13 DIAGNOSIS — I480 Paroxysmal atrial fibrillation: Secondary | ICD-10-CM | POA: Diagnosis not present

## 2023-02-24 ENCOUNTER — Telehealth: Payer: Self-pay | Admitting: Orthopaedic Surgery

## 2023-02-24 MED ORDER — HYDROCODONE-ACETAMINOPHEN 7.5-325 MG PO TABS: 1.0000 | ORAL_TABLET | Freq: Four times a day (QID) | ORAL | 0 refills | Status: DC | PRN

## 2023-02-24 NOTE — Telephone Encounter (Signed)
Dr. Sanjuan Dame pt - spoke w/the pt, he is requesting a refill on Hydrocodone 7.5-325 to be sent to [facility] on [address]

## 2023-03-03 ENCOUNTER — Ambulatory Visit: Payer: BC Managed Care – PPO | Admitting: Orthopaedic Surgery

## 2023-03-12 DIAGNOSIS — F17210 Nicotine dependence, cigarettes, uncomplicated: Secondary | ICD-10-CM | POA: Diagnosis not present

## 2023-03-12 DIAGNOSIS — Z08 Encounter for follow-up examination after completed treatment for malignant neoplasm: Secondary | ICD-10-CM | POA: Diagnosis not present

## 2023-03-12 DIAGNOSIS — Z8521 Personal history of malignant neoplasm of larynx: Secondary | ICD-10-CM | POA: Diagnosis not present

## 2023-03-12 DIAGNOSIS — Z923 Personal history of irradiation: Secondary | ICD-10-CM | POA: Diagnosis not present

## 2023-03-18 ENCOUNTER — Ambulatory Visit: Payer: BC Managed Care – PPO | Admitting: Orthopaedic Surgery

## 2023-03-25 ENCOUNTER — Ambulatory Visit (INDEPENDENT_AMBULATORY_CARE_PROVIDER_SITE_OTHER): Payer: BC Managed Care – PPO | Admitting: Orthopaedic Surgery

## 2023-03-25 ENCOUNTER — Encounter: Payer: Self-pay | Admitting: Orthopaedic Surgery

## 2023-03-25 VITALS — BP 133/72 | HR 90 | Ht 71.0 in | Wt 164.0 lb

## 2023-03-25 DIAGNOSIS — M654 Radial styloid tenosynovitis [de Quervain]: Secondary | ICD-10-CM | POA: Diagnosis not present

## 2023-03-25 MED ORDER — HYDROCODONE-ACETAMINOPHEN 7.5-325 MG PO TABS: 1.0000 | ORAL_TABLET | Freq: Four times a day (QID) | ORAL | 0 refills | Status: DC | PRN

## 2023-03-25 NOTE — Progress Notes (Signed)
I am better.  He still has some pain of the first extensor compartment on the left but he is much improved after the injection. He has no redness.  ROM of the left thumb is full with no swelling or pain over the first extensor compartment. Lourena Simmonds is negative.  NV intact.  Encounter Diagnosis  Name Primary?   Radial styloid tenosynovitis (de quervain) Yes   I will call in pain medicine to have just in case.  I will see prn.  Use the thumb splint as needed.  Call if any problem.  Precautions discussed.  Electronically Signed Darreld Mclean, MD 11/20/202410:06 AM

## 2023-04-10 ENCOUNTER — Ambulatory Visit (HOSPITAL_COMMUNITY)
Admission: RE | Admit: 2023-04-10 | Discharge: 2023-04-10 | Disposition: A | Discharge status: Home / Self Care | Payer: BC Managed Care – PPO | Source: Ambulatory Visit | Attending: Radiology | Admitting: Radiology

## 2023-04-10 ENCOUNTER — Other Ambulatory Visit (HOSPITAL_COMMUNITY): Payer: BC Managed Care – PPO

## 2023-04-10 DIAGNOSIS — C760 Malignant neoplasm of head, face and neck: Secondary | ICD-10-CM | POA: Diagnosis not present

## 2023-04-10 DIAGNOSIS — C01 Malignant neoplasm of base of tongue: Secondary | ICD-10-CM | POA: Insufficient documentation

## 2023-04-10 DIAGNOSIS — I6523 Occlusion and stenosis of bilateral carotid arteries: Secondary | ICD-10-CM | POA: Diagnosis not present

## 2023-04-10 DIAGNOSIS — R590 Localized enlarged lymph nodes: Secondary | ICD-10-CM | POA: Diagnosis not present

## 2023-04-10 DIAGNOSIS — I700 Atherosclerosis of aorta: Secondary | ICD-10-CM | POA: Diagnosis not present

## 2023-04-10 LAB — POCT I-STAT CREATININE: Creatinine, Ser: 1 mg/dL (ref 0.61–1.24)

## 2023-04-10 MED ORDER — IOHEXOL 300 MG/ML  SOLN
100.0000 mL | Freq: Once | INTRAMUSCULAR | Status: AC | PRN
  Administered 2023-04-10: 100 mL via INTRAVENOUS

## 2023-04-14 ENCOUNTER — Ambulatory Visit
Admission: RE | Admit: 2023-04-14 | Discharge: 2023-04-14 | Disposition: A | Discharge status: Home / Self Care | Payer: BC Managed Care – PPO | Source: Ambulatory Visit | Attending: Radiation Oncology | Admitting: Radiation Oncology

## 2023-04-14 ENCOUNTER — Other Ambulatory Visit: Payer: Self-pay

## 2023-04-14 ENCOUNTER — Encounter: Payer: Self-pay | Admitting: Radiation Oncology

## 2023-04-14 VITALS — BP 145/80 | HR 61 | Temp 96.8°F | Resp 20 | Ht 70.5 in | Wt 163.8 lb

## 2023-04-14 DIAGNOSIS — C01 Malignant neoplasm of base of tongue: Secondary | ICD-10-CM | POA: Diagnosis not present

## 2023-04-14 DIAGNOSIS — Z7984 Long term (current) use of oral hypoglycemic drugs: Secondary | ICD-10-CM | POA: Insufficient documentation

## 2023-04-14 DIAGNOSIS — J439 Emphysema, unspecified: Secondary | ICD-10-CM | POA: Diagnosis not present

## 2023-04-14 DIAGNOSIS — F17210 Nicotine dependence, cigarettes, uncomplicated: Secondary | ICD-10-CM | POA: Diagnosis not present

## 2023-04-14 DIAGNOSIS — I6529 Occlusion and stenosis of unspecified carotid artery: Secondary | ICD-10-CM | POA: Diagnosis not present

## 2023-04-14 DIAGNOSIS — Z79899 Other long term (current) drug therapy: Secondary | ICD-10-CM | POA: Insufficient documentation

## 2023-04-14 DIAGNOSIS — Z7901 Long term (current) use of anticoagulants: Secondary | ICD-10-CM | POA: Insufficient documentation

## 2023-04-14 NOTE — Progress Notes (Addendum)
Radiation Oncology         [phone] ________________________________  Name: Trevor Steele MRN: [id number]  Date: 04/14/2023  DOB: [birth year]  Follow-Up Visit Note  CC: Trevor Perches, MD  Trevor Perches, MD  Diagnosis and Prior Radiotherapy:       ICD-10-CM   1. Malignant neoplasm of base of tongue (HCC)  C01     2. Carotid artery calcification, unspecified laterality  I65.29 Ambulatory referral to Vascular Surgery      Cancer Staging  Malignant neoplasm of base of tongue (HCC) Staging form: Pharynx - HPV-Mediated Oropharynx, AJCC 8th Edition - Clinical stage from 11/06/2021: Stage I (cT2, cN1, cM0, p16+) - Signed by Trevor Peak, MD on 11/06/2021 Stage prefix: Initial diagnosis   Radiation Treatment Dates: 12/05/2021 through 01/24/2022 Site Technique Total Dose (Gy) Dose per Fx (Gy) Completed Fx Beam Energies  Neck: HN_BOT IMRT 70/70 2 35/35 6X    CHIEF COMPLAINT:  Here for follow-up and surveillance of throat cancer  Narrative:  The patient returns today for routine follow-up.  Trevor Steele is here for a follow-up appointment today.  Radiation Treatment Dates: 12/05/2021 through 01/24/2022 Pain issues, if any: Denies any pain at radiation site. Using a feeding tube?: no Weight changes, if any: none Swallowing issues, if any: No Smoking or chewing tobacco? Has not smoke since August 24 Using fluoride toothpaste daily? Yes Last ENT visit was on: November 7 or 9th  Other notable issues, if any:  Vitals:   04/14/23 1420  BP: (!) 145/80  Pulse: 61  Resp: 20  Temp: (!) 96.8 F (36 C)  SpO2: 100%  Weight: 163 lb 12.8 oz (74.3 kg)  Height: 5' 10.5" (1.791 m)    ALLERGIES:  has No Known Allergies.  Meds: [facility] Outpatient Medications  Medication Sig Dispense Refill   apixaban (ELIQUIS) 5 MG TABS tablet Take 5 mg by mouth 2 (two) times daily.     atorvastatin (LIPITOR) 40 MG tablet TAKE 1 TABLET(40 MG) BY MOUTH DAILY 90 tablet 1   empagliflozin (JARDIANCE) 10 MG TABS  tablet TAKE 1 TABLET(10 MG) BY MOUTH DAILY BEFORE BREAKFAST. Please call [phone] to schedule an appointment with Dr. Alverda Steele for future refills. Thank you. 1st attempt. 30 tablet 0   esomeprazole (NEXIUM) 40 MG capsule Take 40 mg by mouth daily.  12   flecainide (TAMBOCOR) 50 MG tablet Take 1 tablet (50 mg total) by mouth 2 (two) times daily. 60 tablet 11   HYDROcodone-acetaminophen (NORCO) 7.5-325 MG tablet Take 1 tablet by mouth every 6 (six) hours as needed for moderate pain (pain score 4-6). 22 tablet 0   lisinopril (ZESTRIL) 10 MG tablet Take 1 tablet (10 mg total) by mouth daily. 30 tablet 1   metFORMIN (GLUCOPHAGE) 1000 MG tablet Take 1,000 mg by mouth 2 (two) times daily.  12   metoprolol succinate (TOPROL-XL) 50 MG 24 hr tablet Take 1 tablet (50 mg total) by mouth in the morning and at bedtime. Take with or immediately following a meal. 60 tablet 0   Multiple Vitamin (MULTIVITAMIN WITH MINERALS) TABS tablet Take 1 tablet by mouth daily.     glimepiride (AMARYL) 1 MG tablet Take 1 mg by mouth every evening. (Patient not taking: Reported on 04/14/2023)     [facility] Facility-Administered Medications  Medication Dose Route Frequency Provider Last Rate Last Admin   sodium chloride flush (NS) 0.9 % injection 3 mL  3 mL Intravenous Q12H Dyann Kief, PA-C  Physical Findings:  The patient is in no acute distress. Patient is alert and oriented. Wt Readings from Last 3 Encounters:  04/14/23 163 lb 12.8 oz (74.3 kg)  03/25/23 164 lb (74.4 kg)  01/06/23 164 lb (74.4 kg)    height is 5' 10.5" (1.791 m) and weight is 163 lb 12.8 oz (74.3 kg). His temperature is 96.8 F (36 C) (abnormal). His blood pressure is 145/80 (abnormal) and his pulse is 61. His respiration is 20 and oxygen saturation is 100%. .  General: Alert and oriented, in no acute distress HEENT: Head is normocephalic. Extraocular movements are intact. Oropharynx is notable for no lesions in upper throat Neck: no  palpable masses Pulmonary: lungs are clear to auscultation in all fields Skin: Skin in treatment fields shows satisfactory healing - no dryness Abd: soft NT ND Lymphatics: see Neck Exam Musculoskeletal: Symmetric muscle strength throughout.  Psychiatric: Judgment and insight are intact. Affect is appropriate.   Lab Findings: Lab Results  Component Value Date   WBC 10.0 10/08/2021   HGB 14.7 10/08/2021   HCT 43.7 10/08/2021   MCV 92 10/08/2021   PLT 234 10/08/2021    Lab Results  Component Value Date   TSH 0.826 10/07/2022    Radiographic Findings: CT Chest W Contrast  Result Date: 04/14/2023 CLINICAL DATA:  Head neck carcinoma. Malignant neoplasm base of tongue. * Tracking Code: BO * EXAM: CT CHEST WITH CONTRAST TECHNIQUE: Multidetector CT imaging of the chest was performed during intravenous contrast administration. RADIATION DOSE REDUCTION: This exam was performed according to the departmental dose-optimization program which includes automated exposure control, adjustment of the mA and/or kV according to patient size and/or use of iterative reconstruction technique. CONTRAST:  OMNIPAQUE IOHEXOL 300 MG/ML  SOLN COMPARISON:  PET-CT 04/17/2022 FINDINGS: Cardiovascular: No significant vascular findings. Normal heart size. No pericardial effusion. Mediastinum/Nodes: No supraclavicular adenopathy. No mediastinal adenopathy. 11 mm RIGHT hilar lymph node (image 64/4) is appears increased from comparison PET-CT scan measuring 8 mm. No metabolic activity on comparison FDG PET scan. differing technique between the 2 scans. Lungs/Pleura: Reticular pattern in the RIGHT upper lobe is unchanged from comparison PET-CT scan. Pulmonary nodularity. Upper Abdomen: Enlargement LEFT adrenal gland to 23 mm in width. No change from comparison PET-CT scan. Enlarged LEFT adrenal gland had low-density on comparison PET scan consistent with benign adenoma. No upper abdominal adenopathy. Musculoskeletal: No  aggressive osseous lesion. IMPRESSION: 1. A single enlarged RIGHT hilar lymph node is indeterminate. Depending on clinical suspicion for metastatic disease, recommend follow-up on routine oncology surveillance versus repeat FDG PET scan. 2. No additional evidence of metastatic disease. Electronically Signed   By: Genevive Bi M.D.   On: 04/14/2023 09:09   CT Soft Tissue Neck W Contrast  Result Date: 04/14/2023 CLINICAL DATA:  Head and neck cancer, monitor. Malignant neoplasm of the base of the tongue in remission clinically. Previous radiation EXAM: CT NECK WITH CONTRAST TECHNIQUE: Multidetector CT imaging of the neck was performed using the standard protocol following the bolus administration of intravenous contrast. RADIATION DOSE REDUCTION: This exam was performed according to the departmental dose-optimization program which includes automated exposure control, adjustment of the mA and/or kV according to patient size and/or use of iterative reconstruction technique. CONTRAST:  OMNIPAQUE IOHEXOL 300 MG/ML  SOLN COMPARISON:  08/02/2021 CT. PET scan 09/12/2021. PET scan 04/17/2022. FINDINGS: Pharynx and larynx: No mucosal or submucosal mass lesion or inflammatory change. No evidence of recurrent tumor at the left tongue base/vallecula. Mild post radiation  edematous changes in the hypopharyngeal region. 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 Salivary glands: Submandibular glands are normal. Left parotid gland is normal other than a chronic calcification without evidence of inflammatory change or ductal obstruction. Right parotid gland does not show a definable lesion, in correlation with the previous PET scan. Thyroid: Mildly heterogeneous thyroid gland. No follow-up recommended. Lymph nodes: No lymphadenopathy on either side of the neck. Minimal scar type density at the site of the previously abnormal left level 2 node. No recurrent or new lymphadenopathy.  Vascular: Extensive atherosclerotic calcification at the carotid bifurcations, not evaluated in detail. Limited intracranial: Normal Visualized orbits: Normal Mastoids and visualized paranasal sinuses: Clear Skeleton: Minimal degenerative changes. No sign of bony metastatic disease. Upper chest: Emphysema and scarring at the lung apices. No evidence of upper lung metastatic disease. Other: None IMPRESSION: 1. No evidence of recurrent tumor at the left tongue base/vallecula. Mild post radiation edematous changes in the hypopharyngeal region as usually seen. 2. No lymphadenopathy on either side of the neck. Minimal scar type density at the site of the previously abnormal left level 2 node. 3. Extensive atherosclerotic calcification at the carotid bifurcations, not evaluated in detail. 4. Emphysema and scarring at the lung apices. 5. Right parotid gland does not show a definable lesion, in correlation with the previous PET scan. Aortic Atherosclerosis (ICD10-I70.0) and Emphysema (ICD10-J43.9). Electronically Signed   By: Paulina Fusi M.D.   On: 04/14/2023 08:53    Impression/Plan:    1) Head and Neck Cancer Status: our patient is in remission but does have a nonspecific hilar lymph node that requires surveillance.  Given that he is doing well symptomatically we will enroll him in our survivorship clinic  -Hedda Slade will arrange a TSH, CT chest without contrast, and head and neck survivorship visit in 6 months  Incidentally he also has carotid calcifications and I am referring him to vascular surgery for that.     Patient was advised to stay away from cigarettes.  He has been abstaining for the past 3 and half months  2) Nutritional Status: His weight has stabilized Wt Readings from Last 3 Encounters:  04/14/23 163 lb 12.8 oz (74.3 kg)  03/25/23 164 lb (74.4 kg)  01/06/23 164 lb (74.4 kg)  PEG tube: none  3) Risk Factors: The patient has been educated about risk factors including alcohol and  tobacco abuse; they understand that avoidance of alcohol and tobacco is important to prevent recurrences as well as other cancers - abstaining from smoking with occasional relapses  4) Swallowing: functional, continues his exercises.   5) Dental: Encouraged to continue regular followup with dentistry, and dental hygiene including fluoride rinses.  He states he has an appointment soon.  He states he is using fluoride rinse at night  6) Thyroid function:   Continue to check annually in the survivorship clinic Lab Results  Component Value Date   TSH 0.826 10/07/2022    7) He knows to follow-up with Dr. Pollyann Kennedy in 2 to 3 months as recommended at his last appointment  The patient knows to call with any questions or concerns in the meantime.  I wished him the very best  On date of service, in total, I spent 30 minutes on this encounter. Patient was seen in person.    ----------------------------------    Trevor Peak, MD

## 2023-04-14 NOTE — Progress Notes (Signed)
Trevor Steele is here for a follow-up appointment today.  Radiation Treatment Dates: 12/05/2021 through 01/24/2022 Pain issues, if any: Denies any pain at radiation site. Using a feeding tube?: no Weight changes, if any: none Swallowing issues, if any: No Smoking or chewing tobacco? Has not smoke since August 24 Using fluoride toothpaste daily? Yes Last ENT visit was on: November 7 or 9th  Other notable issues, if any:  Vitals:   04/14/23 1420  BP: (!) 145/80  Pulse: 61  Resp: 20  Temp: (!) 96.8 F (36 C)  SpO2: 100%  Weight: 74.3 kg  Height: 5' 10.5" (1.791 m)

## 2023-04-15 ENCOUNTER — Telehealth: Payer: Self-pay | Admitting: Orthopaedic Surgery

## 2023-04-15 MED ORDER — HYDROCODONE-ACETAMINOPHEN 7.5-325 MG PO TABS: 1.0000 | ORAL_TABLET | Freq: Four times a day (QID) | ORAL | 0 refills | Status: DC | PRN

## 2023-04-15 NOTE — Telephone Encounter (Signed)
DR. Hilda Lias   Patient called requesting a refill on his pain medicine    HYDROcodone-acetaminophen (NORCO) 7.5-325 MG tablet   [facility] on [address]

## 2023-04-16 ENCOUNTER — Encounter: Payer: Self-pay | Admitting: *Deleted

## 2023-04-17 ENCOUNTER — Telehealth: Payer: Self-pay | Admitting: Nurse Practitioner

## 2023-04-22 ENCOUNTER — Other Ambulatory Visit: Payer: Self-pay

## 2023-04-22 DIAGNOSIS — C01 Malignant neoplasm of base of tongue: Secondary | ICD-10-CM

## 2023-05-08 ENCOUNTER — Telehealth: Payer: Self-pay

## 2023-05-08 MED ORDER — HYDROCODONE-ACETAMINOPHEN 7.5-325 MG PO TABS: 1.0000 | ORAL_TABLET | Freq: Four times a day (QID) | ORAL | 0 refills | Status: DC | PRN

## 2023-05-08 NOTE — Telephone Encounter (Signed)
 Hydrocodone-Acetaminophen 7.5/325 MG Qty 22 Tablets  PATIENT USES [facility] ON [address]

## 2023-06-01 ENCOUNTER — Telehealth: Payer: Self-pay | Admitting: Orthopaedic Surgery

## 2023-06-01 MED ORDER — HYDROCODONE-ACETAMINOPHEN 7.5-325 MG PO TABS: 1.0000 | ORAL_TABLET | Freq: Four times a day (QID) | ORAL | 0 refills | Status: DC | PRN

## 2023-06-01 NOTE — Telephone Encounter (Signed)
Dr. Sanjuan Dame pt - pt lvm on 05/29/23 requesting a refill on Hydrocodone 7.5-325 to be sent to [facility] on [address].

## 2023-06-03 ENCOUNTER — Other Ambulatory Visit: Payer: Self-pay

## 2023-06-03 DIAGNOSIS — I6523 Occlusion and stenosis of bilateral carotid arteries: Secondary | ICD-10-CM

## 2023-06-09 ENCOUNTER — Ambulatory Visit (INDEPENDENT_AMBULATORY_CARE_PROVIDER_SITE_OTHER): Payer: BC Managed Care – PPO | Admitting: Vascular Surgery

## 2023-06-09 ENCOUNTER — Ambulatory Visit: Payer: BC Managed Care – PPO

## 2023-06-09 ENCOUNTER — Encounter: Payer: Self-pay | Admitting: Vascular Surgery

## 2023-06-09 VITALS — BP 117/68 | HR 62 | Temp 97.6°F | Resp 18 | Ht 70.5 in | Wt 159.4 lb

## 2023-06-09 DIAGNOSIS — I6523 Occlusion and stenosis of bilateral carotid arteries: Secondary | ICD-10-CM | POA: Diagnosis not present

## 2023-06-09 DIAGNOSIS — I779 Disorder of arteries and arterioles, unspecified: Secondary | ICD-10-CM | POA: Insufficient documentation

## 2023-06-09 DIAGNOSIS — E118 Type 2 diabetes mellitus with unspecified complications: Secondary | ICD-10-CM | POA: Diagnosis not present

## 2023-06-09 NOTE — Progress Notes (Signed)
 Patient name: Trevor Steele MRN: [id number] DOB: [birth year] Sex: male  REASON FOR CONSULT: Incidental finding of calcified carotids on CT  HPI: Trevor Steele is a 63 y.o. male, with history of aortic stenosis, diabetes, hypertension, hyperlipidemia, A-fib, tongue cancer status post radiation that presents for evaluation of calcified carotids noted on CT.  Patient had a CT soft tissue neck on 04/10/2023 to evaluate for any recurrent tongue cancer and there was incidental finding of extensive atherosclerosis of carotid bifurcation.  Patient denies any history of strokes or TIAs.  States he was smoking half a pack a day and is now down to several cigarettes a day.  He is on statin but has been told not to take an aspirin  given his eliquis .  Past Medical History:  Diagnosis Date   Agatston coronary artery calcium  score greater than 400    coronary calcium  score high at 1183 which is 96 percentile for age, race and sex matched controls.   Aortic atherosclerosis (HCC)    Aortic stenosis    Carotid artery occlusion    COVID 06/28/2021   no symptoms, went to hospital with a-fib and diagnosed then   Diabetes mellitus without complication (HCC)    Dysrhythmia    A-fib   GERD (gastroesophageal reflux disease)    High cholesterol    History of kidney stones    Hypertension    Mild dilation of ascending aorta (HCC)    by echo 07/2021 though not corroborated on CT   Mitral regurgitation    Neck mass    Abnormal neck CT 07/2021 concerning for nodal metastasis   PAF (paroxysmal atrial fibrillation) (HCC)    Pneumonia    Pulmonary nodules    PVC's (premature ventricular contractions)     Past Surgical History:  Procedure Laterality Date   BACK SURGERY     BACK SURGERY     lumbar   LARYNGOSCOPY AND ESOPHAGOSCOPY N/A 10/30/2021   Procedure: DIRECT LARYNGOSCOPY WITH BIOPSY; ESOPHAGOSCOPY;  Surgeon: Jesus Oliphant, MD;  Location: [facility] OR;  Service: ENT;  Laterality: N/A;   LEFT HEART CATH AND  CORONARY ANGIOGRAPHY N/A 10/09/2021   Procedure: LEFT HEART CATH AND CORONARY ANGIOGRAPHY;  Surgeon: Verlin Lonni BIRCH, MD;  Location: [facility] INVASIVE [facility] LAB;  Service: Cardiovascular;  Laterality: N/A;   TONSILLECTOMY Bilateral 10/30/2021   Procedure: TONSILLECTOMY; FROZEN SECTION;  Surgeon: Jesus Oliphant, MD;  Location: [facility] OR;  Service: ENT;  Laterality: Bilateral;    Family History  Problem Relation Age of Onset   Coronary artery disease Mother    Coronary artery disease Father    Mesothelioma Father    CAD Neg Hx        negative for premature CAD    SOCIAL HISTORY: Social History   Socioeconomic History   Marital status: Married    Spouse name: Not on file   Number of children: Not on file   Years of education: Not on file   Highest education level: Not on file  Occupational History   Not on file  Tobacco Use   Smoking status: Former    Current packs/day: 0.50    Types: Cigarettes   Smokeless tobacco: Current  Vaping Use   Vaping status: Never Used  Substance and Sexual Activity   Alcohol use: Not Currently   Drug use: No   Sexual activity: Yes    Birth control/protection: None  Other Topics Concern   Not on file  Social History Narrative   Not on  file   Social Drivers of Health   Financial Resource Strain: Not on file  Food Insecurity: Low Risk  (07/11/2022)   [facility]   Hunger Vital Sign    Worried About Running Out of Food in the Last Year: Never true    Ran Out of Food in the Last Year: Never true  Transportation Needs: No Transportation Needs (07/11/2022)   [facility]   Transportation    In the past 12 months, has lack of reliable transportation kept you from medical appointments, meetings, work or from getting things needed for daily living? : No  Physical Activity: Not on file  Stress: Not on file  Social Connections: Not on file  Intimate Partner Violence: Not on file    No Known  Allergies  Current Outpatient Medications  Medication Sig Dispense Refill   apixaban  (ELIQUIS ) 5 MG TABS tablet Take 5 mg by mouth 2 (two) times daily.     atorvastatin  (LIPITOR) 40 MG tablet TAKE 1 TABLET(40 MG) BY MOUTH DAILY 90 tablet 1   empagliflozin  (JARDIANCE ) 10 MG TABS tablet TAKE 1 TABLET(10 MG) BY MOUTH DAILY BEFORE BREAKFAST. Please call [phone] to schedule an appointment with Dr. Lurena Red for future refills. Thank you. 1st attempt. 30 tablet 0   esomeprazole (NEXIUM) 40 MG capsule Take 40 mg by mouth daily.  12   flecainide  (TAMBOCOR ) 50 MG tablet Take 1 tablet (50 mg total) by mouth 2 (two) times daily. 60 tablet 11   HYDROcodone -acetaminophen  (NORCO) 7.5-325 MG tablet Take 1 tablet by mouth every 6 (six) hours as needed for moderate pain (pain score 4-6). 22 tablet 0   lisinopril  (ZESTRIL ) 10 MG tablet Take 1 tablet (10 mg total) by mouth daily. 30 tablet 1   metFORMIN  (GLUCOPHAGE ) 1000 MG tablet Take 1,000 mg by mouth 2 (two) times daily.  12   metoprolol  succinate (TOPROL -XL) 50 MG 24 hr tablet Take 1 tablet (50 mg total) by mouth in the morning and at bedtime. Take with or immediately following a meal. 60 tablet 0   Multiple Vitamin (MULTIVITAMIN WITH MINERALS) TABS tablet Take 1 tablet by mouth daily.     glimepiride  (AMARYL ) 1 MG tablet Take 1 mg by mouth every evening. (Patient not taking: Reported on 04/14/2023)     Current Facility-Administered Medications  Medication Dose Route Frequency Provider Last Rate Last Admin   sodium chloride  flush (NS) 0.9 % injection 3 mL  3 mL Intravenous Q12H Parthenia Olivia HERO, PA-C        REVIEW OF SYSTEMS:  [X]  denotes positive finding, [ ]  denotes negative finding Cardiac  Comments:  Chest pain or chest pressure:    Shortness of breath upon exertion:    Short of breath when lying flat:    Irregular heart rhythm:        Vascular    Pain in calf, thigh, or hip brought on by ambulation:    Pain in feet at night that wakes  you up from your sleep:     Blood clot in your veins:    Leg swelling:         Pulmonary    Oxygen at home:    Productive cough:     Wheezing:         Neurologic    Sudden weakness in arms or legs:     Sudden numbness in arms or legs:     Sudden onset of difficulty speaking or  slurred speech:    Temporary loss of vision in one eye:     Problems with dizziness:         Gastrointestinal    Blood in stool:     Vomited blood:         Genitourinary    Burning when urinating:     Blood in urine:        Psychiatric    Major depression:         Hematologic    Bleeding problems:    Problems with blood clotting too easily:        Skin    Rashes or ulcers:        Constitutional    Fever or chills:      PHYSICAL EXAM: Vitals:   06/09/23 1438  BP: 117/68  Pulse: 62  Resp: 18  Temp: 97.6 F (36.4 C)  TempSrc: Oral  SpO2: 94%  Weight: 159 lb 6.4 oz (72.3 kg)  Height: 5' 10.5 (1.791 m)    GENERAL: The patient is a well-nourished male, in no acute distress. The vital signs are documented above. CARDIAC: There is a regular rate and rhythm.  VASCULAR:  No prior neck incisions palpable carotid pulses PULMONARY: No respiratory distress. ABDOMEN: Soft and non-tender. MUSCULOSKELETAL: There are no major deformities or cyanosis. NEUROLOGIC: No focal weakness or paresthesias are detected.  Cranial nerves II through XII grossly intact. SKIN: There are no ulcers or rashes noted. PSYCHIATRIC: The patient has a normal affect.  DATA:   CT soft tissue neck reviewed from 04/10/2023 with calcification of the bilateral carotid bifurcation  Assessment/Plan:   63 y.o. male, with history of aortic stenosis, diabetes, hypertension, hyperlipidemia, A-fib, tongue cancer status post radiation that presents for evaluation of calcified carotids noted on CT.  Discussed this is an incidental finding on CT soft tissue neck to evaluate for any recurrent tongue cancer after radiation.  I  reviewed his CT and certainly has calcification of the bilateral carotids.  Difficult to evaluate degree of stenosis with the CT soft tissue neck.  I have ordered a carotid duplex for further evaluation.  Discussed you could also get radiation-induced changes to the artery given prior radiation therapy for his tongue cancer.  I will schedule a phone visit with him once the duplex is complete.  His carotid disease is asymptomatic and discussed no indication for surgical intervention unless greater than 80% stenosis for stroke risk reduction.   Lonni DOROTHA Gaskins, MD [facility] Office: [phone]

## 2023-06-12 DIAGNOSIS — I700 Atherosclerosis of aorta: Secondary | ICD-10-CM | POA: Diagnosis not present

## 2023-06-12 DIAGNOSIS — E1169 Type 2 diabetes mellitus with other specified complication: Secondary | ICD-10-CM | POA: Diagnosis not present

## 2023-06-12 DIAGNOSIS — I10 Essential (primary) hypertension: Secondary | ICD-10-CM | POA: Diagnosis not present

## 2023-06-12 DIAGNOSIS — I480 Paroxysmal atrial fibrillation: Secondary | ICD-10-CM | POA: Diagnosis not present

## 2023-06-17 ENCOUNTER — Other Ambulatory Visit: Payer: Self-pay

## 2023-06-17 DIAGNOSIS — I6523 Occlusion and stenosis of bilateral carotid arteries: Secondary | ICD-10-CM

## 2023-06-19 ENCOUNTER — Ambulatory Visit (HOSPITAL_COMMUNITY)
Admission: RE | Admit: 2023-06-19 | Discharge: 2023-06-19 | Disposition: A | Discharge status: Home / Self Care | Payer: BC Managed Care – PPO | Source: Ambulatory Visit | Attending: Vascular Surgery | Admitting: Vascular Surgery

## 2023-06-19 DIAGNOSIS — I6523 Occlusion and stenosis of bilateral carotid arteries: Secondary | ICD-10-CM | POA: Diagnosis not present

## 2023-06-19 DIAGNOSIS — I779 Disorder of arteries and arterioles, unspecified: Secondary | ICD-10-CM | POA: Diagnosis not present

## 2023-06-23 ENCOUNTER — Telehealth: Payer: Self-pay | Admitting: Orthopaedic Surgery

## 2023-06-23 ENCOUNTER — Ambulatory Visit: Payer: BC Managed Care – PPO | Admitting: Vascular Surgery

## 2023-06-23 DIAGNOSIS — I6523 Occlusion and stenosis of bilateral carotid arteries: Secondary | ICD-10-CM | POA: Diagnosis not present

## 2023-06-23 MED ORDER — HYDROCODONE-ACETAMINOPHEN 7.5-325 MG PO TABS: 1.0000 | ORAL_TABLET | Freq: Four times a day (QID) | ORAL | 0 refills | Status: DC | PRN

## 2023-06-23 NOTE — Telephone Encounter (Signed)
Dr. Sanjuan Dame pt - pt lvm on 06/22/23 at 6:51pm requesting a refill on Hydrocodone 7.5-325 to be sent to [facility] on [address]

## 2023-06-23 NOTE — Progress Notes (Signed)
Virtual Visit via Telephone Note   I connected with Trevor Steele on 06/23/2023 using the [employer] by telephone and verified that I was speaking with the correct person using two identifiers. Patient was located in his car driving. I am located at [facility] office.   The limitations of evaluation and management by telemedicine and the availability of in person appointments have been previously discussed with the patient and are documented in the patients chart. The patient expressed understanding and consented to proceed.  PCP: Carylon Perches, MD   Chief Complaint: Follow-up after carotid duplex  History of Present Illness: Trevor Steele is a 63 y.o. male with history of aortic stenosis, diabetes, hypertension, hyperlipidemia, A-fib, prior radiation for tongue cancer that presents for phone visit after carotid duplex.  Previously seen with a CT soft tissue neck on 04/10/2023 to evaluate for any recurrent tongue cancer and there was finding of atherosclerosis of his carotid bifurcation.  He was still smoking several cigarettes a day.  Ultimately I sent him for carotid duplex.  No history of stroke or TIA.  Past Medical History:  Diagnosis Date   Agatston coronary artery calcium score greater than 400    coronary calcium score high at 1183 which is 96 percentile for age, race and sex matched controls.   Aortic atherosclerosis (HCC)    Aortic stenosis    Carotid artery occlusion    COVID 06/28/2021   no symptoms, went to hospital with a-fib and diagnosed then   Diabetes mellitus without complication (HCC)    Dysrhythmia    A-fib   GERD (gastroesophageal reflux disease)    High cholesterol    History of kidney stones    Hypertension    Mild dilation of ascending aorta (HCC)    by echo 07/2021 though not corroborated on CT   Mitral regurgitation    Neck mass    Abnormal neck CT 07/2021 concerning for nodal metastasis   PAF (paroxysmal atrial fibrillation) (HCC)    Pneumonia    Pulmonary  nodules    PVC's (premature ventricular contractions)     Past Surgical History:  Procedure Laterality Date   BACK SURGERY     BACK SURGERY     lumbar   LARYNGOSCOPY AND ESOPHAGOSCOPY N/A 10/30/2021   Procedure: DIRECT LARYNGOSCOPY WITH BIOPSY; ESOPHAGOSCOPY;  Surgeon: Serena Colonel, MD;  Location: [facility] OR;  Service: ENT;  Laterality: N/A;   LEFT HEART CATH AND CORONARY ANGIOGRAPHY N/A 10/09/2021   Procedure: LEFT HEART CATH AND CORONARY ANGIOGRAPHY;  Surgeon: Kathleene Hazel, MD;  Location: [facility] INVASIVE CV LAB;  Service: Cardiovascular;  Laterality: N/A;   TONSILLECTOMY Bilateral 10/30/2021   Procedure: TONSILLECTOMY; FROZEN SECTION;  Surgeon: Serena Colonel, MD;  Location[facility] OR;  Service: ENT;  Laterality: Bilateral;    No outpatient medications have been marked as taking for the 06/23/23 encounter (Appointment) with Cephus Shelling, MD.   Current Facility-Administered Medications for the 06/23/23 encounter (Appointment) with Cephus Shelling, MD  Medication   sodium chloride flush (NS) 0.9 % injection 3 mL    12 system ROS was negative unless otherwise noted in HPI   Observations/Objective:  Carotid ultrasound reviewed from [facility] on 06/19/2023 and highest velocity in the left carotid artery bulb with a velocity 140/20 (awaiting reading by radiology)  Assessment and Plan:   63 y.o. male, with history of aortic stenosis, diabetes, hypertension, hyperlipidemia, A-fib, tongue cancer status post radiation that presents for phone visit for further evaluation of  calcified carotids noted on CT.  Discussed this is an incidental finding on CT soft tissue neck to evaluate for any recurrent tongue cancer after radiation.  I reviewed his CT and certainly has calcification of the bilateral carotids.  I sent him for carotid duplex.  This is awaiting a final read by radiology who reads the studies at [facility].  I have reviewed the pictures and it looks like the highest velocity is in  the left carotid bulb measuring 140/20.  This is not significant flow-limiting stenosis.  Again discussed that we recommend surgery for greater than 80% stenosis for asymptomatic carotid disease.  I will see him in 6 months with repeat carotid ultrasound.  Follow Up Instructions:   Follow up: Follow-up in 6 months and reasonable with repeat carotid duplex for ongoing carotid artery surveillance.   I discussed the assessment and treatment plan with the patient. The patient was provided an opportunity to ask questions and all were answered. The patient agreed with the plan and demonstrated an understanding of the instructions.   The patient was advised to call back or seek an in-person evaluation if the symptoms worsen or if the condition fails to improve as anticipated.  I spent 5-10 minutes with the patient via telephone encounter.   Signed, Cephus Shelling [facility] Office: [phone]  06/23/2023, 4:16 PM

## 2023-07-06 ENCOUNTER — Other Ambulatory Visit: Payer: Self-pay

## 2023-07-06 DIAGNOSIS — I6523 Occlusion and stenosis of bilateral carotid arteries: Secondary | ICD-10-CM

## 2023-07-13 ENCOUNTER — Telehealth: Payer: Self-pay

## 2023-07-13 NOTE — Telephone Encounter (Signed)
 Hydrocodone-Acetaminophen 7.5/325 MG Qty 22 Tablets  PATIENT USES [facility] ON [address]

## 2023-07-14 MED ORDER — HYDROCODONE-ACETAMINOPHEN 7.5-325 MG PO TABS: 1.0000 | ORAL_TABLET | Freq: Four times a day (QID) | ORAL | 0 refills | Status: DC | PRN

## 2023-08-03 ENCOUNTER — Other Ambulatory Visit: Payer: Self-pay | Admitting: Orthopaedic Surgery

## 2023-08-03 NOTE — Telephone Encounter (Signed)
 Dr. Sanjuan Dame pt - spoke w/the pt, pt is requesting a refill for Hydrocodone 7.5-325, 22 tablets, every 6 hours PRN for moderate pain.

## 2023-08-05 ENCOUNTER — Other Ambulatory Visit: Payer: Self-pay | Admitting: Orthopedic Surgery

## 2023-08-05 MED ORDER — HYDROCODONE-ACETAMINOPHEN 7.5-325 MG PO TABS: 1.0000 | ORAL_TABLET | Freq: Four times a day (QID) | ORAL | 0 refills | Status: DC | PRN

## 2023-08-06 ENCOUNTER — Other Ambulatory Visit: Payer: Self-pay | Admitting: Internal Medicine

## 2023-08-11 ENCOUNTER — Ambulatory Visit: Payer: BC Managed Care – PPO | Attending: Cardiology | Admitting: Cardiology

## 2023-08-11 ENCOUNTER — Encounter: Payer: Self-pay | Admitting: Cardiology

## 2023-08-11 VITALS — BP 108/68 | HR 57 | Ht 71.0 in | Wt 156.0 lb

## 2023-08-11 DIAGNOSIS — I350 Nonrheumatic aortic (valve) stenosis: Secondary | ICD-10-CM

## 2023-08-11 DIAGNOSIS — I480 Paroxysmal atrial fibrillation: Secondary | ICD-10-CM

## 2023-08-11 DIAGNOSIS — E782 Mixed hyperlipidemia: Secondary | ICD-10-CM | POA: Diagnosis not present

## 2023-08-11 DIAGNOSIS — I5032 Chronic diastolic (congestive) heart failure: Secondary | ICD-10-CM

## 2023-08-11 DIAGNOSIS — D6869 Other thrombophilia: Secondary | ICD-10-CM

## 2023-08-11 NOTE — Patient Instructions (Signed)
 Medication Instructions:  Your physician recommends that you continue on your current medications as directed. Please refer to the Current Medication list given to you today.  *If you need a refill on your cardiac medications before your next appointment, please call your pharmacy*  Lab Work: None If you have labs (blood work) drawn today and your tests are completely normal, you will receive your results only by: MyChart Message (if you have MyChart) OR A paper copy in the mail If you have any lab test that is abnormal or we need to change your treatment, we will call you to review the results.  Testing/Procedures: Your physician has requested that you have an echocardiogram. Echocardiography is a painless test that uses sound waves to create images of your heart. It provides your doctor with information about the size and shape of your heart and how well your heart's chambers and valves are working. This procedure takes approximately one hour. There are no restrictions for this procedure. Please do NOT wear cologne, perfume, aftershave, or lotions (deodorant is allowed). Please arrive 15 minutes prior to your appointment time.  Please note: We ask at that you not bring children with you during ultrasound (echo/ vascular) testing. Due to room size and safety concerns, children are not allowed in the ultrasound rooms during exams. Our front office staff cannot provide observation of children in our lobby area while testing is being conducted. An adult accompanying a patient to their appointment will only be allowed in the ultrasound room at the discretion of the ultrasound technician under special circumstances. We apologize for any inconvenience.   Follow-Up: At [facility], you and your health needs are our priority.  As part of our continuing mission to provide you with exceptional heart care, our providers are all part of one team.  This team includes your primary Cardiologist  (physician) and Advanced Practice Providers or APPs (Physician Assistants and Nurse Practitioners) who all work together to provide you with the care you need, when you need it.  Your next appointment:   6 month(s)  Provider:   You may see Dina Rich, MD or one of the following Advanced Practice Providers on your designated Care Team:   Randall An, PA-C  [place], [place] Jacolyn Reedy, [place]     We recommend signing up for the patient portal called "MyChart".  Sign up information is provided on this After Visit Summary.  MyChart is used to connect with patients for Virtual Visits (Telemedicine).  Patients are able to view lab/test results, encounter notes, upcoming appointments, etc.  Non-urgent messages can be sent to your provider as well.   To learn more about what you can do with MyChart, go to [email or web address].   Other Instructions

## 2023-08-11 NOTE — Progress Notes (Signed)
 Clinical Summary Mr. Velardi is a 63 y.o.male seen today for follow up of the following medical problems.      1.PAF - diagnosed 06/2021 - multiple ER visits 2023 with afib with RVR - seen by EP, started on flecanide      EKG today shows sinus rhythm at 57.  - no recent palpitations - compliant with meds. No bleeding on eliquis.      2. HTN - compliant with meds     3. Hyperlipidemia - 09/2021 TC 112 TG 154 HDL 27 LDL 58 - 09/2022 TC 161 TG 096 HDL 33 LDL 62 - compliant with meds     4. CAD - 10/2021 cath mild to moderate nonobstructive CAD - denies any recent symptoms     5. 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   Aortic atherosclerosis (HCC)    Aortic stenosis    Carotid artery occlusion    COVID 06/28/2021   no symptoms, went to hospital with a-fib and diagnosed then   Diabetes mellitus without complication (HCC)    Dysrhythmia    A-fib   GERD (gastroesophageal reflux disease)    High cholesterol    History of kidney stones    Hypertension    Mild dilation of ascending aorta (HCC)    by echo 07/2021 though not corroborated on CT   Mitral regurgitation    Neck mass    Abnormal neck CT 07/2021 concerning for nodal metastasis   PAF (paroxysmal atrial fibrillation) (HCC)    Pneumonia     Pulmonary nodules    PVC's (premature ventricular contractions)      No Known Allergies   Current Outpatient Medications  Medication Sig Dispense Refill   apixaban (ELIQUIS) 5 MG TABS tablet Take 5 mg by mouth 2 (two) times daily.     atorvastatin (LIPITOR) 40 MG tablet TAKE 1 TABLET(40 MG) BY MOUTH DAILY 90 tablet 1   empagliflozin (JARDIANCE) 10 MG TABS tablet TAKE 1 TABLET(10 MG) BY MOUTH DAILY BEFORE BREAKFAST. Please call [phone] to schedule an appointment with Dr. Alverda Skeans for future refills. Thank you. 1st attempt. 30 tablet 0   esomeprazole (NEXIUM) 40 MG capsule Take 40 mg by mouth daily.  12   flecainide (TAMBOCOR) 50 MG tablet Take 1 tablet (50 mg total) by mouth 2 (two) times daily. Please keep scheduled appointment for future refills. Thank you. 60 tablet 0   glimepiride (AMARYL) 1 MG tablet Take 1 mg by mouth every evening. (Patient not taking: Reported on 04/14/2023)     HYDROcodone-acetaminophen (NORCO) 7.5-325 MG tablet Take 1 tablet by mouth every 6 (six) hours as needed for moderate pain (pain score 4-6). 22 tablet 0   lisinopril (ZESTRIL) 10  MG tablet Take 1 tablet (10 mg total) by mouth daily. 30 tablet 1   metFORMIN (GLUCOPHAGE) 1000 MG tablet Take 1,000 mg by mouth 2 (two) times daily.  12   metoprolol succinate (TOPROL-XL) 50 MG 24 hr tablet Take 1 tablet (50 mg total) by mouth in the morning and at bedtime. Take with or immediately following a meal. 60 tablet 0   Multiple Vitamin (MULTIVITAMIN WITH MINERALS) TABS tablet Take 1 tablet by mouth daily.     Current Facility-Administered Medications  Medication Dose Route Frequency Provider Last Rate Last Admin   sodium chloride flush (NS) 0.9 % injection 3 mL  3 mL Intravenous Q12H Dyann Kief, PA-C         Past Surgical History:  Procedure Laterality Date   BACK SURGERY     BACK SURGERY     lumbar   LARYNGOSCOPY AND ESOPHAGOSCOPY N/A 10/30/2021   Procedure: DIRECT LARYNGOSCOPY WITH BIOPSY;  ESOPHAGOSCOPY;  Surgeon: Serena Colonel, MD;  Location[facility] OR;  Service: ENT;  Laterality: N/A;   LEFT HEART CATH AND CORONARY ANGIOGRAPHY N/A 10/09/2021   Procedure: LEFT HEART CATH AND CORONARY ANGIOGRAPHY;  Surgeon: Kathleene Hazel, MD;  Location: [facility] INVAS[facility] LAB;  Service: Cardiovascular;  Laterality: N/A;   TONSILLECTOMY Bilateral 10/30/2021   Procedure: TONSILLECTOMY; FROZEN SECTION;  Surgeon: Serena Colonel, MD;  Location: [facility] OR;  Service: ENT;  Laterality: Bilateral;     No Known Allergies    Family History  Problem Relation Age of Onset   Coronary artery disease Mother    Coronary artery disease Father    Mesothelioma Father    CAD Neg Hx        negative for premature CAD     Social History Mr. Fodor reports that he has quit smoking. His smoking use included cigarettes. He uses smokeless tobacco. Mr. Bramblett reports that he does not currently use alcohol.     Physical Examination Today's Vitals   08/11/23 0950  BP: 108/68  Pulse: (!) 57  SpO2: 95%  Weight: 156 lb (70.8 kg)  Height: 5\' 11"  (1.803 m)   Body mass index is 21.76 kg/m.  Gen: resting comfortably, no acute distress HEENT: no scleral icterus, pupils equal round and reactive, no palptable cervical adenopathy,  CV: RRR, 2/6 systolic murmur rusb, no jvd Resp: Clear to auscultation bilaterally GI: abdomen is soft, non-tender, non-distended, normal bowel sounds, no hepatosplenomegaly MSK: extremities are warm, no edema.  Skin: warm, no rash Neuro:  no focal deficits Psych: appropriate affect   Diagnostic Studies  10/2021 nuclear stress Poor quality study, significant extracardiac activity Severe LV systolic dysfunction, EF 23%.  Patient was having frequent PVCs however which can affect gating Fixed inferior perfusion defect with abnormal wall motion consistent with prior infarct Partially reversible anterior perfusion defect with abnormal wall motion suggesting infarct with peri-infarct  ischemia High risk study   07/2021 echo 1. Left ventricular ejection fraction, by estimation, is 45 to 50%. The  left ventricle has mildly decreased function. The left ventricle  demonstrates global hypokinesis. Left ventricular diastolic parameters are  consistent with Grade I diastolic  dysfunction (impaired relaxation).   2. Right ventricular systolic function is mildly reduced. The right  ventricular size is mildly enlarged. Tricuspid regurgitation signal is  inadequate for assessing PA pressure.   3. Left atrial size was mildly dilated.   4. The mitral valve is normal in structure. Mild mitral valve  regurgitation. No evidence of mitral stenosis.   5.  The aortic valve is normal in structure. Aortic valve regurgitation is  mild. Mild to moderate aortic valve stenosis. Aortic valve area, by VTI  measures 1.73 cm. Aortic valve mean gradient measures 18.0 mmHg. Aortic  valve Vmax measures 2.82 m/s.   6. There is mild dilatation of the ascending aorta, measuring 38 mm.   7. The inferior vena cava is normal in size with greater than 50%  respiratory variability, suggesting right atrial pressure of 3 mmHg.        11/2021 echo 1. Wall motion assessment/LVEF challenging with frequent PVCs.. Left  ventricular ejection fraction, by estimation, is 40 to 45%. The left  ventricle has mildly decreased function. The left ventricle demonstrates  global hypokinesis. The left ventricular  internal cavity size was mildly dilated. Left ventricular diastolic  parameters are indeterminate.   2. Right ventricular systolic function is mildly reduced. The right  ventricular size is mildly enlarged. There is normal pulmonary artery  systolic pressure.   3. The mitral valve is normal in structure. No evidence of mitral valve  regurgitation.   4. The aortic valve is calcified. Aortic valve regurgitation is trivial.  Mild to moderate aortic valve stenosis.   5. The inferior vena cava is normal in size  with greater than 50%  respiratory variability, suggesting right atrial pressure of 3 mmHg.    10/2021 cath Prox RCA lesion is 30% stenosed.   Prox Cx to Mid Cx lesion is 20% stenosed.   Mid LAD-1 lesion is 50% stenosed.   Mid LAD-2 lesion is 40% stenosed.   Dist LAD lesion is 30% stenosed.   Moderate non-obstructive mid LAD stenosis. This does not appear to be flow limiting.  Mild non-obstructive disease in the Circumflex and RCA   Recommendations: medical management of non-obstructive CAD   08/2022 echo 1. Left ventricular ejection fraction, by estimation, is 50 to 55%. The  left ventricle has low normal function. The left ventricle has no regional  wall motion abnormalities. Left ventricular diastolic parameters are  indeterminate.   2. Right ventricular systolic function is normal. The right ventricular  size is normal. Tricuspid regurgitation signal is inadequate for assessing  PA pressure.   3. Left atrial size was mildly dilated.   4. The mitral valve is grossly normal. No evidence of mitral valve  regurgitation. No evidence of mitral stenosis.   5. The aortic valve is calcified. There is moderate calcification of the  aortic valve. Aortic valve regurgitation is trivial. Mild to moderate  aortic valve stenosis. Aortic valve area, by VTI measures 1.29 cm. Aortic  valve mean gradient measures 12.0  mmHg. Aortic valve Vmax measures 2.62 m/s. DVI is 0.26.   6. The inferior vena cava is normal in size with greater than 50%  respiratory variability, suggesting right atrial pressure of 3 mmHg.   Assessment and Plan  1.Afib/acquired thrombophilia - no recent symptoms - continue current meds including eliquis for stroke prevention - EKG today shows SR at 57   2. Nonobstructive CAD - denies any symptoms, continue current meds   3. HFimpEF - LVEF has normalized, no symptoms - continue current meds   4. PVCs - no recent symptoms, continue current meds   5.  Hyperlipidemia - LDL is at goal, continue atorvastatin   6. Aortic stenosis - mild to moderate. AVA VTI and DI are moderate range, gradient mild. SVI 39.  - repeat echo    F/u 6 months  Antoine Poche, M.D.

## 2023-08-21 ENCOUNTER — Ambulatory Visit (HOSPITAL_COMMUNITY)
Admission: RE | Admit: 2023-08-21 | Discharge: 2023-08-21 | Disposition: A | Discharge status: Home / Self Care | Source: Ambulatory Visit | Attending: Cardiology

## 2023-08-21 DIAGNOSIS — I350 Nonrheumatic aortic (valve) stenosis: Secondary | ICD-10-CM | POA: Insufficient documentation

## 2023-08-21 LAB — ECHOCARDIOGRAM COMPLETE
AR max vel: 1.03 cm2
AV Area VTI: 1.16 cm2
AV Area mean vel: 1.24 cm2
AV Mean grad: 9.8 mmHg
AV Peak grad: 20.3 mmHg
Ao pk vel: 2.25 m/s
Area-P 1/2: 4.8 cm2
Calc EF: 53.6 %
MV VTI: 1.58 cm2
S' Lateral: 3.65 cm
Single Plane A2C EF: 50.2 %
Single Plane A4C EF: 58 %

## 2023-08-21 NOTE — Progress Notes (Signed)
*  PRELIMINARY RESULTS* Echocardiogram 2D Echocardiogram has been performed.  Trevor Steele 08/21/2023, 1:28 PM

## 2023-08-25 ENCOUNTER — Other Ambulatory Visit: Payer: Self-pay | Admitting: Orthopaedic Surgery

## 2023-08-25 NOTE — Telephone Encounter (Signed)
 Dr. Vicente Graham pt - pt lvm requesting a refill for Hydrocodone  7.5 to be sent to [facility] on [address]

## 2023-08-27 ENCOUNTER — Telehealth: Payer: Self-pay | Admitting: Orthopaedic Surgery

## 2023-08-27 NOTE — Telephone Encounter (Signed)
 Dr. Vicente Graham pt - pt lvm requesting a refill for his Hydrocodone  7.5-325 to be sent to [facility] on [address]

## 2023-08-31 MED ORDER — HYDROCODONE-ACETAMINOPHEN 7.5-325 MG PO TABS
1.0000 | ORAL_TABLET | Freq: Four times a day (QID) | ORAL | 0 refills | Status: DC | PRN
Start: 2023-08-31 — End: 2023-09-23

## 2023-08-31 NOTE — Addendum Note (Signed)
 Addended by: Milinda Allen on: 08/31/2023 01:21 PM   Modules accepted: Orders

## 2023-08-31 NOTE — Addendum Note (Signed)
 Addended by: [place] Snow T on: 08/31/2023 11:55 AM   Modules accepted: Orders

## 2023-09-21 ENCOUNTER — Other Ambulatory Visit: Payer: Self-pay | Admitting: Orthopaedic Surgery

## 2023-09-21 NOTE — Telephone Encounter (Signed)
 Dr. Vicente Graham pt - pt lvm at 7:02am this morning requesting a refill for Hydrocode 7.5-325 to be sent to [facility] on [address]  [phone]

## 2023-09-23 ENCOUNTER — Other Ambulatory Visit (INDEPENDENT_AMBULATORY_CARE_PROVIDER_SITE_OTHER): Payer: Self-pay

## 2023-09-23 ENCOUNTER — Ambulatory Visit (INDEPENDENT_AMBULATORY_CARE_PROVIDER_SITE_OTHER): Admitting: Orthopaedic Surgery

## 2023-09-23 ENCOUNTER — Encounter: Payer: Self-pay | Admitting: Orthopaedic Surgery

## 2023-09-23 VITALS — BP 141/48 | HR 80 | Ht 71.0 in | Wt 156.0 lb

## 2023-09-23 DIAGNOSIS — M25512 Pain in left shoulder: Secondary | ICD-10-CM | POA: Diagnosis not present

## 2023-09-23 DIAGNOSIS — G8929 Other chronic pain: Secondary | ICD-10-CM

## 2023-09-23 MED ORDER — HYDROCODONE-ACETAMINOPHEN 7.5-325 MG PO TABS
1.0000 | ORAL_TABLET | Freq: Four times a day (QID) | ORAL | 0 refills | Status: DC | PRN
Start: 2023-09-23 — End: 2023-10-13

## 2023-09-23 MED ORDER — METHYLPREDNISOLONE ACETATE 40 MG/ML IJ SUSP
40.0000 mg | Freq: Once | INTRAMUSCULAR | Status: AC
  Administered 2023-09-23: 40 mg via INTRA_ARTICULAR

## 2023-09-23 NOTE — Patient Instructions (Signed)
 While we are working on your approval for MRI please go ahead and call to schedule your appointment with [facility] within at least one (1) week.   Central Scheduling [phone]

## 2023-09-23 NOTE — Progress Notes (Signed)
 My shoulder is hurting bad.  He has had increasing pain of the left shoulder since the first of the year.  He saw Dr. Hildy Lowers about this in February. He has had injection but it has not gotten better.  Dr. Hildy Lowers suggested he come here.  He has no trauma, no swelling, no redness.  He has pain laying on it at night and any attempt to raise above his chin.  He has tried heat, ice, rest, Tylenol  with no help.  He is on Eliquis  and cannot take NSAIDs.  He is tired of hurting.  He says it is getting worse over the last ten days.  Examination of left Upper Extremity is done.  Inspection:   Overall:  Elbow non-tender without crepitus or defects, forearm non-tender without crepitus or defects, wrist non-tender without crepitus or defects, hand non-tender.    Shoulder: with glenohumeral joint tenderness, without effusion.   Upper arm:  without swelling and tenderness   Range of motion:   Overall:  Full range of motion of the elbow, full range of motion of wrist and full range of motion in fingers.   Shoulder:  left  90 degrees forward flexion; 75 degrees abduction; 10 degrees internal rotation, 10 degrees external rotation, 0 degrees extension, 30 degrees adduction.   Stability:   Overall:  Shoulder, elbow and wrist stable   Strength and Tone:   Overall full shoulder muscles strength, full upper arm strength and normal upper arm bulk and tone.   X-rays were done of the left shoulder, reported separately.  Encounter Diagnosis  Name Primary?   Chronic left shoulder pain Yes   PROCEDURE NOTE:  The patient request injection, verbal consent was obtained.  The left shoulder was prepped appropriately after time out was performed.   Sterile technique was observed and injection of 1 cc of DepoMedrol 40mg  with several cc's of plain xylocaine . Anesthesia was provided by ethyl chloride and a 20-gauge needle was used to inject the shoulder area. A posterior approach was used.  The injection was tolerated  well.  A band aid dressing was applied.  The patient was advised to apply ice later today and tomorrow to the injection sight as needed.  I will get a MRI of the left shoulder, return in two weeks.  Call if any problem.  Precautions discussed.  Electronically Signed Pleasant Brilliant, MD 5/21/20252:36 PM

## 2023-10-02 ENCOUNTER — Ambulatory Visit: Payer: Self-pay | Admitting: Radiation Oncology

## 2023-10-05 ENCOUNTER — Telehealth: Payer: Self-pay | Admitting: Nurse Practitioner

## 2023-10-05 DIAGNOSIS — Z125 Encounter for screening for malignant neoplasm of prostate: Secondary | ICD-10-CM | POA: Diagnosis not present

## 2023-10-05 DIAGNOSIS — E118 Type 2 diabetes mellitus with unspecified complications: Secondary | ICD-10-CM | POA: Diagnosis not present

## 2023-10-05 DIAGNOSIS — I480 Paroxysmal atrial fibrillation: Secondary | ICD-10-CM | POA: Diagnosis not present

## 2023-10-05 DIAGNOSIS — I700 Atherosclerosis of aorta: Secondary | ICD-10-CM | POA: Diagnosis not present

## 2023-10-05 DIAGNOSIS — I10 Essential (primary) hypertension: Secondary | ICD-10-CM | POA: Diagnosis not present

## 2023-10-05 DIAGNOSIS — Z79899 Other long term (current) drug therapy: Secondary | ICD-10-CM | POA: Diagnosis not present

## 2023-10-06 ENCOUNTER — Inpatient Hospital Stay: Payer: BC Managed Care – PPO | Admitting: Nurse Practitioner

## 2023-10-06 ENCOUNTER — Ambulatory Visit: Payer: BC Managed Care – PPO | Attending: Nurse Practitioner

## 2023-10-08 ENCOUNTER — Ambulatory Visit: Payer: Self-pay | Admitting: Cardiology

## 2023-10-09 ENCOUNTER — Ambulatory Visit (HOSPITAL_COMMUNITY)
Admission: RE | Admit: 2023-10-09 | Discharge: 2023-10-09 | Disposition: A | Discharge status: Home / Self Care | Source: Ambulatory Visit | Attending: Orthopaedic Surgery | Admitting: Orthopaedic Surgery

## 2023-10-09 DIAGNOSIS — M25512 Pain in left shoulder: Secondary | ICD-10-CM | POA: Diagnosis not present

## 2023-10-09 DIAGNOSIS — I10 Essential (primary) hypertension: Secondary | ICD-10-CM | POA: Diagnosis not present

## 2023-10-09 DIAGNOSIS — M75122 Complete rotator cuff tear or rupture of left shoulder, not specified as traumatic: Secondary | ICD-10-CM | POA: Diagnosis not present

## 2023-10-09 DIAGNOSIS — I480 Paroxysmal atrial fibrillation: Secondary | ICD-10-CM | POA: Diagnosis not present

## 2023-10-09 DIAGNOSIS — M948X1 Other specified disorders of cartilage, shoulder: Secondary | ICD-10-CM | POA: Diagnosis not present

## 2023-10-09 DIAGNOSIS — M75112 Incomplete rotator cuff tear or rupture of left shoulder, not specified as traumatic: Secondary | ICD-10-CM | POA: Diagnosis not present

## 2023-10-09 DIAGNOSIS — E1169 Type 2 diabetes mellitus with other specified complication: Secondary | ICD-10-CM | POA: Diagnosis not present

## 2023-10-09 DIAGNOSIS — M7582 Other shoulder lesions, left shoulder: Secondary | ICD-10-CM | POA: Diagnosis not present

## 2023-10-09 DIAGNOSIS — G8929 Other chronic pain: Secondary | ICD-10-CM | POA: Insufficient documentation

## 2023-10-12 ENCOUNTER — Telehealth: Payer: Self-pay | Admitting: Orthopaedic Surgery

## 2023-10-12 NOTE — Telephone Encounter (Signed)
 Dr. Vicente Graham pt - pt lvm requesting a refill for Hydrocodone  7.5-325

## 2023-10-13 MED ORDER — HYDROCODONE-ACETAMINOPHEN 7.5-325 MG PO TABS: 1.0000 | ORAL_TABLET | Freq: Four times a day (QID) | ORAL | 0 refills | Status: DC | PRN

## 2023-10-14 ENCOUNTER — Ambulatory Visit (HOSPITAL_COMMUNITY)

## 2023-10-15 ENCOUNTER — Telehealth: Payer: Self-pay | Admitting: Nurse Practitioner

## 2023-10-15 NOTE — Progress Notes (Signed)
 Oncology Nurse Navigator Documentation   I called Trevor Steele to work on getting his CT chest scheduled before he saw Lacie Burton NP on 6/24 for results. He told me that he is out of town and will need to reschedule the 6/24 appointment for mid July. I will schedule the CT scan to be done before his newly rescheduled appointment with Lacie. I have made scheduling aware and will follow up in 2 weeks to check on appointments.   Lynetta Saran RN, BSN, OCN Head & Neck Oncology Nurse Navigator [facility] Phone # [phone]  Fax # [phone]

## 2023-10-27 ENCOUNTER — Encounter: Admitting: Nurse Practitioner

## 2023-10-28 ENCOUNTER — Ambulatory Visit: Admitting: Orthopaedic Surgery

## 2023-10-30 ENCOUNTER — Encounter (HOSPITAL_COMMUNITY): Payer: Self-pay | Admitting: *Deleted

## 2023-10-30 ENCOUNTER — Emergency Department (HOSPITAL_COMMUNITY)

## 2023-10-30 ENCOUNTER — Emergency Department (HOSPITAL_COMMUNITY)
Admission: EM | Admit: 2023-10-30 | Discharge: 2023-10-30 | Disposition: A | Discharge status: Home / Self Care | Attending: Emergency Medicine | Admitting: Emergency Medicine

## 2023-10-30 ENCOUNTER — Other Ambulatory Visit: Payer: Self-pay

## 2023-10-30 DIAGNOSIS — I4891 Unspecified atrial fibrillation: Secondary | ICD-10-CM | POA: Diagnosis not present

## 2023-10-30 DIAGNOSIS — Z7901 Long term (current) use of anticoagulants: Secondary | ICD-10-CM | POA: Diagnosis not present

## 2023-10-30 DIAGNOSIS — I480 Paroxysmal atrial fibrillation: Secondary | ICD-10-CM | POA: Diagnosis not present

## 2023-10-30 DIAGNOSIS — I700 Atherosclerosis of aorta: Secondary | ICD-10-CM | POA: Diagnosis not present

## 2023-10-30 DIAGNOSIS — I771 Stricture of artery: Secondary | ICD-10-CM | POA: Diagnosis not present

## 2023-10-30 LAB — CBC WITH DIFFERENTIAL/PLATELET
Abs Immature Granulocytes: 0.02 10*3/uL (ref 0.00–0.07)
Basophils Absolute: 0.1 10*3/uL (ref 0.0–0.1)
Basophils Relative: 1 %
Eosinophils Absolute: 1 10*3/uL — ABNORMAL HIGH (ref 0.0–0.5)
Eosinophils Relative: 11 %
HCT: 45.5 % (ref 39.0–52.0)
Hemoglobin: 15.5 g/dL (ref 13.0–17.0)
Immature Granulocytes: 0 %
Lymphocytes Relative: 17 %
Lymphs Abs: 1.6 10*3/uL (ref 0.7–4.0)
MCH: 31.5 pg (ref 26.0–34.0)
MCHC: 34.1 g/dL (ref 30.0–36.0)
MCV: 92.5 fL (ref 80.0–100.0)
Monocytes Absolute: 0.9 10*3/uL (ref 0.1–1.0)
Monocytes Relative: 9 %
Neutro Abs: 5.9 10*3/uL (ref 1.7–7.7)
Neutrophils Relative %: 62 %
Platelets: 226 10*3/uL (ref 150–400)
RBC: 4.92 MIL/uL (ref 4.22–5.81)
RDW: 13.4 % (ref 11.5–15.5)
WBC: 9.5 10*3/uL (ref 4.0–10.5)
nRBC: 0 % (ref 0.0–0.2)

## 2023-10-30 LAB — BASIC METABOLIC PANEL WITH GFR
Anion gap: 13 (ref 5–15)
BUN: 18 mg/dL (ref 8–23)
CO2: 21 mmol/L — ABNORMAL LOW (ref 22–32)
Calcium: 9.3 mg/dL (ref 8.9–10.3)
Chloride: 100 mmol/L (ref 98–111)
Creatinine, Ser: 0.93 mg/dL (ref 0.61–1.24)
GFR, Estimated: >60 mL/min (ref >60)
Glucose, Bld: 94 mg/dL (ref 70–99)
Potassium: 3.8 mmol/L (ref 3.5–5.1)
Sodium: 134 mmol/L — ABNORMAL LOW (ref 135–145)

## 2023-10-30 LAB — MAGNESIUM: Magnesium: 1.6 mg/dL — ABNORMAL LOW (ref 1.7–2.4)

## 2023-10-30 MED ORDER — MAGNESIUM SULFATE 2 GM/50ML IV SOLN
2.0000 g | Freq: Once | INTRAVENOUS | Status: AC
  Administered 2023-10-30: 2 g via INTRAVENOUS
  Filled 2023-10-30: qty 50

## 2023-10-30 NOTE — ED Triage Notes (Signed)
 Pt states his watch noting his HR 80-156. Pt denies any SOB or CP.  Pt states hx of AFIB 2 years ago.

## 2023-10-30 NOTE — ED Provider Notes (Signed)
 [place] EMERGENCY DEPARTMENT AT [facility] Provider Note   CSN: [id number] Arrival date & time: 10/30/23  1641     Patient presents with: Atrial Fibrillation   Trevor Steele is a 63 y.o. male.   HPI 63 year old male presents with atrial fibrillation.  The patient has a history of paroxysmal A-fib and is currently treated with flecainide  and Eliquis .  He reports compliance and no missed doses.  However he states that after he mowed the lawn today he was resting at the table and when he checked his smart watch (he checks his watch frequently due to his prior history of A-fib) he noticed that his heart rate was changing numbers frequently and tachycardic and he was concerned about A-fib so he presented today.  He denies feeling bad including no dizziness or lightheadedness, chest pain, shortness of breath or recent illness.  No recent change in his medicines.  He otherwise feels asymptomatic but was concerned because he had to be admitted multiple times in 2023 for A-fib.  Prior to Admission medications   Medication Sig Start Date End Date Taking? Authorizing Provider  apixaban  (ELIQUIS ) 5 MG TABS tablet Take 5 mg by mouth 2 (two) times daily.    [provider]  atorvastatin  (LIPITOR) 40 MG tablet TAKE 1 TABLET(40 MG) BY MOUTH DAILY 10/14/22   Alvan Dorn FALCON, MD  empagliflozin  (JARDIANCE ) 10 MG TABS tablet TAKE 1 TABLET(10 MG) BY MOUTH DAILY BEFORE BREAKFAST. Please call [phone] to schedule an appointment with Dr. Lurena Red for future refills. Thank you. 1st attempt. 10/10/22   Thukkani, Arun K, MD  esomeprazole (NEXIUM) 40 MG capsule Take 40 mg by mouth daily. 10/10/15   [provider]  flecainide  (TAMBOCOR ) 50 MG tablet Take 1 tablet (50 mg total) by mouth 2 (two) times daily. Please keep scheduled appointment for future refills. Thank you. 08/06/23   Thukkani, Arun K, MD  glimepiride  (AMARYL ) 1 MG tablet Take 1 mg by mouth every evening.    [provider]  HYDROcodone -acetaminophen  (NORCO) 7.5-325 MG tablet Take 1 tablet by mouth every 6 (six) hours as needed for moderate pain (pain score 4-6). 10/13/23   Brenna Lin, MD  lisinopril  (ZESTRIL ) 10 MG tablet Take 1 tablet (10 mg total) by mouth daily. 08/04/21   Johnson, Clanford L, MD  metFORMIN  (GLUCOPHAGE ) 1000 MG tablet Take 1,000 mg by mouth 2 (two) times daily. 10/10/15   [provider]  metoprolol  succinate (TOPROL -XL) 50 MG 24 hr tablet Take 1 tablet (50 mg total) by mouth in the morning and at bedtime. Take with or immediately following a meal. 08/13/21   Lue Elsie BROCKS, MD  Multiple Vitamin (MULTIVITAMIN WITH MINERALS) TABS tablet Take 1 tablet by mouth daily.    [provider]    Allergies: Patient has no known allergies.    Review of Systems  Respiratory:  Negative for shortness of breath.   Cardiovascular:  Negative for chest pain and palpitations.  Gastrointestinal:  Negative for diarrhea and vomiting.  Neurological:  Negative for light-headedness.    Updated Vital Signs BP 110/64   Pulse 68   Temp 97.8 F (36.6 C) (Temporal)   Resp 16   Ht 5' 11 (1.803 m)   Wt 73.5 kg   SpO2 98%   BMI 22.59 kg/m   Physical Exam Vitals and nursing note reviewed.  Constitutional:      General: He is not in acute distress.    Appearance: He is well-developed. He  is not ill-appearing or diaphoretic.  HENT:     Head: Normocephalic and atraumatic.   Cardiovascular:     Rate and Rhythm: Tachycardia present. Rhythm irregular.     Heart sounds: Normal heart sounds.  Pulmonary:     Effort: Pulmonary effort is normal.     Breath sounds: Normal breath sounds.  Abdominal:     General: There is no distension.   Musculoskeletal:     Right lower leg: No edema.     Left lower leg: No edema.   Skin:    General: Skin is warm and dry.   Neurological:     Mental Status: He is alert.     (all labs ordered are listed, but only abnormal results are  displayed) Labs Reviewed  CBC WITH DIFFERENTIAL/PLATELET - Abnormal; Notable for the following components:      Result Value   Eosinophils Absolute 1.0 (*)    All other components within normal limits  BASIC METABOLIC PANEL WITH GFR - Abnormal; Notable for the following components:   Sodium 134 (*)    CO2 21 (*)    All other components within normal limits  MAGNESIUM  - Abnormal; Notable for the following components:   Magnesium  1.6 (*)    All other components within normal limits    EKG: EKG Interpretation Date/Time:  Friday October 30 2023 17:21:42 EDT Ventricular Rate:  68 PR Interval:  159 QRS Duration:  82 QT Interval:  372 QTC Calculation: 396 R Axis:   69  Text Interpretation: Sinus rhythm Consider left atrial enlargement Abnormal R-wave progression, early transition afib no longer present no significant change when compared to April 2025 Confirmed by Freddi Hamilton [id number] on 10/30/2023 5:25:56 PM  Radiology: DG Chest Portable 1 View Result Date: 10/30/2023 CLINICAL DATA:  afib EXAM: PORTABLE CHEST - 1 VIEW COMPARISON:  August 12, 2021 FINDINGS: Chronic coarsening of the pulmonary interstitium without overt pulmonary edema. No focal airspace consolidation, pleural effusion, or pneumothorax. No cardiomegaly. Tortuous aorta with aortic atherosclerosis. No acute fracture or destructive lesions. Multilevel thoracic osteophytosis. IMPRESSION: No acute cardiopulmonary abnormality. Electronically Signed   By: Rogelia Myers M.D.   On: 10/30/2023 17:47     Procedures   Medications Ordered in the ED  magnesium  sulfate IVPB 2 g 50 mL (2 g Intravenous New Bag/Given 10/30/23 1732)                                    Medical Decision Making Amount and/or Complexity of Data Reviewed Labs: ordered.    Details: Mildly low magnesium  Radiology: ordered and independent interpretation performed.    Details: No CHF ECG/medicine tests: ordered and independent interpretation performed.     Details: A-fib with RVR, second EKG shows sinus rhythm  Risk Prescription drug management.   Patient presents with paroxysmal A-fib and in A-fib with RVR.  Had discussion with patient and family about cardioversion versus rate control.  He decided on rate control though prior to the medicines being given he spontaneously converted.  He has remained in sinus rhythm ever since.  Labs show a mildly low magnesium  that was repleted with IV magnesium .  Otherwise, he has no anginal symptoms and does not appear to be symptomatic at all.  No signs of CHF on exam.  Will discharge home to follow-up with cardiology.  Given return precautions.     Final diagnoses:  None    ED Discharge  Orders     None          Freddi Hamilton, MD 10/30/23 [phone]

## 2023-10-30 NOTE — Discharge Instructions (Signed)
 If you develop recurrent abnormal heart rate, or if you develop dizziness, chest pain, shortness of breath, or any other new/concerning symptoms then return to the ER.

## 2023-10-30 NOTE — ED Notes (Signed)
 Pt chose to have meds, edp aware.

## 2023-10-30 NOTE — ED Notes (Signed)
 See triage notes. Has not missed any eliquis  doses in last 3 weeks. Color wnl.

## 2023-11-04 ENCOUNTER — Ambulatory Visit (INDEPENDENT_AMBULATORY_CARE_PROVIDER_SITE_OTHER): Admitting: Orthopaedic Surgery

## 2023-11-04 ENCOUNTER — Encounter: Payer: Self-pay | Admitting: Orthopaedic Surgery

## 2023-11-04 DIAGNOSIS — G8929 Other chronic pain: Secondary | ICD-10-CM

## 2023-11-04 DIAGNOSIS — M25512 Pain in left shoulder: Secondary | ICD-10-CM

## 2023-11-04 MED ORDER — HYDROCODONE-ACETAMINOPHEN 7.5-325 MG PO TABS: 1.0000 | ORAL_TABLET | Freq: Four times a day (QID) | ORAL | 0 refills | Status: DC | PRN

## 2023-11-04 NOTE — Progress Notes (Signed)
 My shoulder hurts.  He had the MRI of the left shoulder showing: IMPRESSION: 1. High-grade partial to full-thickness tear of the anterior supraspinatus tendon footprint measuring up to approximately 1.9 cm in AP dimension. This appears to be a full-thickness tear with retraction of the tendon fibers up to approximately 1.9 cm to the humeral head apex. 2. Moderate partial-thickness articular sided tear of the adjacent more posterior supraspinatus tendon footprint in a region measuring only 5 mm in AP dimension. 3. Mild infraspinatus tendinosis. 4. Moderate teres minor muscle atrophy. This can be seen with chronic quadrilateral space syndrome. No space-occupying lesion is seen within the quadrilateral space on the current MRI. 5. Mild to moderate proximal long head of the biceps tendinosis. 6. Moderate to high-grade degenerative changes of the acromioclavicular joint. 7. Moderate glenohumeral cartilage thinning.  I have independently reviewed the MRI.    I have explained the findings to him.  I will have him see Dr. Onesimo for further treatment.  He is agreeable.  ROM of the left shoulder is painful and decreased.  He prefers not to move it much today.  NV intact.  ROM of neck is full.  Encounter Diagnosis  Name Primary?   Chronic left shoulder pain Yes   I have reviewed the [place]  Controlled Substance Reporting System web site prior to prescribing narcotic medicine for this patient.  To see Dr. Onesimo.  Call if any problem.  Precautions discussed.  Electronically Signed Lemond Stable, MD 7/2/20258:32 AM

## 2023-11-10 NOTE — Progress Notes (Signed)
 Oncology Nurse Navigator Documentation   I called and spoke to Trevor Steele to remind him of his scheduled CT scan on 7/15 and follow up with Trevor Burton NP on 7/21 to go over the results. He tells me that he is aware of the appointments and plans to come to both of them. He has my number to call if he has any concerns or questions.   [name] RN, BSN, OCN Head & Neck Oncology Nurse Navigator [facility] at [facility] Phone # [phone]  Fax # [phone]

## 2023-11-17 ENCOUNTER — Ambulatory Visit (HOSPITAL_COMMUNITY)
Admission: RE | Admit: 2023-11-17 | Discharge: 2023-11-17 | Disposition: A | Discharge status: Home / Self Care | Source: Ambulatory Visit | Attending: Radiation Oncology | Admitting: Radiation Oncology

## 2023-11-17 DIAGNOSIS — I700 Atherosclerosis of aorta: Secondary | ICD-10-CM | POA: Diagnosis not present

## 2023-11-17 DIAGNOSIS — R918 Other nonspecific abnormal finding of lung field: Secondary | ICD-10-CM | POA: Diagnosis not present

## 2023-11-17 DIAGNOSIS — C01 Malignant neoplasm of base of tongue: Secondary | ICD-10-CM | POA: Diagnosis not present

## 2023-11-18 ENCOUNTER — Ambulatory Visit: Admitting: Orthopedic Surgery

## 2023-11-18 ENCOUNTER — Encounter: Payer: Self-pay | Admitting: Orthopedic Surgery

## 2023-11-18 VITALS — BP 109/66 | HR 57 | Ht 71.5 in | Wt 166.4 lb

## 2023-11-18 DIAGNOSIS — M19012 Primary osteoarthritis, left shoulder: Secondary | ICD-10-CM | POA: Diagnosis not present

## 2023-11-18 DIAGNOSIS — M75122 Complete rotator cuff tear or rupture of left shoulder, not specified as traumatic: Secondary | ICD-10-CM | POA: Diagnosis not present

## 2023-11-18 NOTE — Patient Instructions (Signed)
 Contact the clinic if you are interested in discussing reverse shoulder replacement in more detail.

## 2023-11-19 NOTE — Progress Notes (Signed)
 New Patient Visit  Assessment: Trevor Steele is a 63 y.o. male with the following: L shoulder rotator cuff tear Moderate L GH arthritis  Plan: Trevor Steele has pain and decreased motion in left shoulder.  Large rotator cuff tear with retraction.  Underlying arthritis. More predictable outcome with replacement vs. Repair.  He states understanding.  Reverse shoulder arthroplasty discussed.  Pamphlets provided.  He will contact the clinic if interested.  We will need to obtain clearance prior to surgery.   Follow-up: Return if symptoms worsen or fail to improve.  Subjective:  Chief Complaint  Patient presents with   Shoulder Pain    Left- very painful and hard with ROM today    History of Present Illness: Trevor Steele is a 63 y.o. male who presents for evaluation of left shoulder pain. He has seen Dr. Brenna for same complaint.  Prior injection was not successful.  He has obtained an MRI and is here to discuss the findings.  No specific injury.    Review of Systems: No fevers or chills No numbness or tingling No chest pain No shortness of breath No bowel or bladder dysfunction No GI distress No headaches   Medical History:  Past Medical History:  Diagnosis Date   Agatston coronary artery calcium  score greater than 400    coronary calcium  score high at 1183 which is 96 percentile for age, race and sex matched controls.   Aortic atherosclerosis (HCC)    Aortic stenosis    Carotid artery occlusion    COVID 06/28/2021   no symptoms, went to hospital with a-fib and diagnosed then   Diabetes mellitus without complication (HCC)    Dysrhythmia    A-fib   GERD (gastroesophageal reflux disease)    High cholesterol    History of kidney stones    Hypertension    Mild dilation of ascending aorta (HCC)    by echo 07/2021 though not corroborated on CT   Mitral regurgitation    Neck mass    Abnormal neck CT 07/2021 concerning for nodal metastasis   PAF (paroxysmal  atrial fibrillation) (HCC)    Pneumonia    Pulmonary nodules    PVC's (premature ventricular contractions)     Past Surgical History:  Procedure Laterality Date   BACK SURGERY     BACK SURGERY     lumbar   LARYNGOSCOPY AND ESOPHAGOSCOPY N/A 10/30/2021   Procedure: DIRECT LARYNGOSCOPY WITH BIOPSY; ESOPHAGOSCOPY;  Surgeon: Jesus Oliphant, MD;  Location: [facility] OR;  Service: ENT;  Laterality: N/A;   LEFT HEART CATH AND CORONARY ANGIOGRAPHY N/A 10/09/2021   Procedure: LEFT HEART CATH AND CORONARY ANGIOGRAPHY;  Surgeon: Verlin Lonni BIRCH, MD;  Location: [facility] INVASIVE CV LAB;  Service: Cardiovascular;  Laterality: N/A;   TONSILLECTOMY Bilateral 10/30/2021   Procedure: TONSILLECTOMY; FROZEN SECTION;  Surgeon: Jesus Oliphant, MD;  Location: [facility] OR;  Service: ENT;  Laterality: Bilateral;    Family History  Problem Relation Age of Onset   Coronary artery disease Mother    Coronary artery disease Father    Mesothelioma Father    CAD Neg Hx        negative for premature CAD   Social History   Tobacco Use   Smoking status: Former    Current packs/day: 0.50    Types: Cigarettes   Smokeless tobacco: Current  Vaping Use   Vaping status: Never Used  Substance Use Topics   Alcohol use: Not Currently   Drug use: No  No Known Allergies  Current Meds  Medication Sig   apixaban  (ELIQUIS ) 5 MG TABS tablet Take 5 mg by mouth 2 (two) times daily.   atorvastatin  (LIPITOR) 40 MG tablet TAKE 1 TABLET(40 MG) BY MOUTH DAILY   empagliflozin  (JARDIANCE ) 10 MG TABS tablet TAKE 1 TABLET(10 MG) BY MOUTH DAILY BEFORE BREAKFAST. Please call [phone] to schedule an appointment with Dr. Lurena Red for future refills. Thank you. 1st attempt.   esomeprazole (NEXIUM) 40 MG capsule Take 40 mg by mouth daily.   flecainide  (TAMBOCOR ) 50 MG tablet Take 1 tablet (50 mg total) by mouth 2 (two) times daily. Please keep scheduled appointment for future refills. Thank you.   glimepiride  (AMARYL ) 1 MG tablet Take 1 mg  by mouth every evening.   HYDROcodone -acetaminophen  (NORCO) 7.5-325 MG tablet Take 1 tablet by mouth every 6 (six) hours as needed for moderate pain (pain score 4-6).   lisinopril  (ZESTRIL ) 10 MG tablet Take 1 tablet (10 mg total) by mouth daily.   metFORMIN  (GLUCOPHAGE ) 1000 MG tablet Take 1,000 mg by mouth 2 (two) times daily.   metoprolol  succinate (TOPROL -XL) 50 MG 24 hr tablet Take 1 tablet (50 mg total) by mouth in the morning and at bedtime. Take with or immediately following a meal.   Multiple Vitamin (MULTIVITAMIN WITH MINERALS) TABS tablet Take 1 tablet by mouth daily.   Current Facility-Administered Medications for the 11/18/23 encounter (Office Visit) with Onesimo Oneil LABOR, MD  Medication   sodium chloride  flush (NS) 0.9 % injection 3 mL    Objective: BP 109/66   Pulse (!) 57   Ht 5' 11.5 (1.816 m)   Wt 166 lb 6.4 oz (75.5 kg)   BMI 22.88 kg/m   Physical Exam:  General: Alert and oriented. and No acute distress. Gait: Normal gait.  Left shoulder without deformity.  Atrophy in posterior shoulder.  Tenderness over anterior shoulder.  90 FF.  Limited IR.  75 Abduction.  Sensation intact in axillary nerve distribution.   IMAGING: I personally reviewed images previously obtained in clinic  L shoulder MRI   IMPRESSION: 1. High-grade partial to full-thickness tear of the anterior supraspinatus tendon footprint measuring up to approximately 1.9 cm in AP dimension. This appears to be a full-thickness tear with retraction of the tendon fibers up to approximately 1.9 cm to the humeral head apex. 2. Moderate partial-thickness articular sided tear of the adjacent more posterior supraspinatus tendon footprint in a region measuring only 5 mm in AP dimension. 3. Mild infraspinatus tendinosis. 4. Moderate teres minor muscle atrophy. This can be seen with chronic quadrilateral space syndrome. No space-occupying lesion is seen within the quadrilateral space on the current MRI. 5.  Mild to moderate proximal long head of the biceps tendinosis. 6. Moderate to high-grade degenerative changes of the acromioclavicular joint. 7. Moderate glenohumeral cartilage thinning.    New Medications:  No orders of the defined types were placed in this encounter.     Oneil LABOR Onesimo, MD  11/19/2023 11:33 PM

## 2023-11-22 NOTE — Progress Notes (Deleted)
 SABRA

## 2023-11-23 ENCOUNTER — Inpatient Hospital Stay: Attending: Nurse Practitioner | Admitting: Nurse Practitioner

## 2023-11-23 ENCOUNTER — Telehealth: Payer: Self-pay | Admitting: Nurse Practitioner

## 2023-11-23 NOTE — Telephone Encounter (Signed)
 I called Mr. Villalon regarding his missed appointment with me this morning. He said his wife was supposed to cancel for him due to dealing with a traumatic shoulder injury.   I reviewed his CT which shows stable likely benign pulmonary nodules, no evidence of recurrent malignancy. He is pleased. He has smoking history so these should be followed with annual low-dose CT lung ca surveillance and pt agrees, PCP can manage - sees Dr. Sheryle q3-4 months.  He also missed an appt with ENT Dr. Jesus; he understands the importance of this visit and plans to reschedule.  Will let Dr. Izell and navigation know that he missed 2 appts with survivorship NP.   Zahari Xiang, NP

## 2023-11-24 ENCOUNTER — Telehealth: Payer: Self-pay | Admitting: Orthopaedic Surgery

## 2023-11-24 NOTE — Telephone Encounter (Signed)
 Dr. Janae pt - pt lvm requesting a refill for Hydrocodone  7.5-325 to be sent to [facility]

## 2023-11-25 MED ORDER — HYDROCODONE-ACETAMINOPHEN 7.5-325 MG PO TABS: 1.0000 | ORAL_TABLET | Freq: Four times a day (QID) | ORAL | 0 refills | Status: DC | PRN

## 2023-11-27 ENCOUNTER — Encounter: Payer: Self-pay | Admitting: *Deleted

## 2023-11-27 NOTE — Progress Notes (Signed)
 Called pt an left VM to make him aware that I've made an appt for him with Dr.Rosen on 8/5/at 2:00p.

## 2023-12-08 DIAGNOSIS — Z923 Personal history of irradiation: Secondary | ICD-10-CM | POA: Diagnosis not present

## 2023-12-08 DIAGNOSIS — C109 Malignant neoplasm of oropharynx, unspecified: Secondary | ICD-10-CM | POA: Diagnosis not present

## 2023-12-09 NOTE — Progress Notes (Signed)
 Oncology Nurse Navigator Documentation   I received a request from Dr. Izell to schedule Trevor Steele with Morna Kendall NP in 4 months for head and neck survivorship. He has missed several appointments with her although they did connect over the phone recently for CT scan results. At this time Trevor Steele declines to be scheduled with Morna. He saw his ENT Dr. Jesus yesterday and is scheduled to see him again in December and is pleased with this plan. I have notified Dr. Izell and Morna of Trevor Steele wishes. He has my direct number to call me back if he has any questions or concerns.   Trevor Jefferson RN, BSN, OCN Head & Neck Oncology Nurse Navigator [facility] at [facility] Phone # [phone]  Fax # [phone]

## 2023-12-15 ENCOUNTER — Telehealth: Payer: Self-pay | Admitting: Orthopaedic Surgery

## 2023-12-15 NOTE — Telephone Encounter (Signed)
 Dr. Janae pt - pt lvm requesting a refill for Hydrocodone  7.5-325 to be sent to [facility] on [address]

## 2023-12-16 MED ORDER — HYDROCODONE-ACETAMINOPHEN 7.5-325 MG PO TABS: 1.0000 | ORAL_TABLET | Freq: Four times a day (QID) | ORAL | 0 refills | Status: DC | PRN

## 2023-12-25 ENCOUNTER — Encounter: Payer: Self-pay | Admitting: Radiology

## 2023-12-30 ENCOUNTER — Ambulatory Visit (INDEPENDENT_AMBULATORY_CARE_PROVIDER_SITE_OTHER): Admitting: Orthopaedic Surgery

## 2023-12-30 ENCOUNTER — Encounter: Payer: Self-pay | Admitting: Orthopaedic Surgery

## 2023-12-30 DIAGNOSIS — G8929 Other chronic pain: Secondary | ICD-10-CM | POA: Diagnosis not present

## 2023-12-30 DIAGNOSIS — M25512 Pain in left shoulder: Secondary | ICD-10-CM

## 2023-12-30 MED ORDER — METHYLPREDNISOLONE ACETATE 40 MG/ML IJ SUSP
40.0000 mg | Freq: Once | INTRAMUSCULAR | Status: AC
  Administered 2023-12-30: 40 mg via INTRA_ARTICULAR

## 2023-12-30 MED ORDER — HYDROCODONE-ACETAMINOPHEN 7.5-325 MG PO TABS: 1.0000 | ORAL_TABLET | Freq: Four times a day (QID) | ORAL | 0 refills | Status: DC | PRN

## 2023-12-30 NOTE — Addendum Note (Signed)
 Addended by: MARCINE HUSBAND T on: 12/30/2023 01:19 PM   Modules accepted: Orders

## 2023-12-30 NOTE — Progress Notes (Signed)
 PROCEDURE NOTE:  The patient request injection, verbal consent was obtained.  The left shoulder was prepped appropriately after time out was performed.   Sterile technique was observed and injection of 1 cc of DepoMedrol 40mg  with several cc's of plain xylocaine . Anesthesia was provided by ethyl chloride and a 20-gauge needle was used to inject the shoulder area. A posterior approach was used.  The injection was tolerated well.  A band aid dressing was applied.  The patient was advised to apply ice later today and tomorrow to the injection sight as needed.  Encounter Diagnosis  Name Primary?   Chronic left shoulder pain Yes   I have reviewed the [place]  Controlled Substance Reporting System web site prior to prescribing narcotic medicine for this patient.  Return in three months.  Call if any problem.  Precautions discussed.  Electronically Signed Lemond Stable, MD 8/27/20258:53 AM

## 2023-12-31 NOTE — Progress Notes (Deleted)
 Cardiology Office Note    Date:  12/31/2023  ID:  Zamier, Eggebrecht [birth year], MRN [id number] Cardiologist: Alvan Carrier, MD Electrophysiologist:  Danelle Birmingham, MD {   History of Present Illness:    Trevor Steele is a 63 y.o. 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   He was last examined by Dr. Alvan in 08/2023 and denied any recent chest pain or palpitations at that time. Was maintaining NSR by repeat EKG. Was recommended to obtain a follow-up echo to reassess his EF. He was continued on Eliquis , Atorvastatin  40mg  daily, Jardiance  10mg  daily, Flecainide  50mg  BID, Lisinopril  10mg  daily and Lopressor  50mg  BID. Repeat echo showed his EF was mildly reduced at 45-50% and AS was in a mild to moderate range.  He was evaluated in the ED in 10/2023 for evaluation of tachycardia when checking his vitals at home. He was in atrial fibrillation with RVR with HR in the 140's upon arrival. He spontaneously converted back to NSR prior to administration of medications and was discharged home. Labs at that time showed Hgb 15.5, platelets 226, Na+ 134, K+ 3.8 and Mg at 1.6.  ROS: ***  Studies Reviewed:   EKG: EKG is*** ordered today and demonstrates ***   EKG Interpretation Date/Time:    Ventricular Rate:    PR Interval:    QRS Duration:    QT Interval:    QTC Calculation:   R Axis:      Text Interpretation:         Cardiac Catheterization: 10/2021   Prox RCA lesion is 30% stenosed.   Prox Cx to Mid Cx lesion is 20% stenosed.   Mid LAD-1 lesion is 50% stenosed.   Mid LAD-2 lesion is 40% stenosed.   Dist LAD lesion is 30% stenosed.   Moderate non-obstructive mid LAD stenosis. This does not appear to  be flow limiting.  Mild non-obstructive disease in the Circumflex and RCA   Recommendations: medical management of non-obstructive CAD  Carotid Dopplers: 06/2023 IMPRESSION: 1. Mild (1-49%) stenosis proximal right internal carotid artery secondary to heterogenous atherosclerotic plaque. 2. Mild (1-49%) stenosis proximal left internal carotid artery secondary to echogenic/calcified atherosclerotic plaque. 3. Vertebral arteries are patent with normal antegrade flow.  Echocardiogram: 08/2023 IMPRESSIONS     1. Left ventricular ejection fraction, by estimation, is 45 to 50%. The  left ventricle has mildly decreased function. The left ventricle has no  regional wall motion abnormalities. Left ventricular diastolic parameters  are consistent with Grade I  diastolic dysfunction (impaired relaxation).   2. Right ventricular systolic function is normal. The right ventricular  size is normal. Tricuspid regurgitation signal is inadequate for assessing  PA pressure.   3. The mitral valve is normal in structure. No evidence of mitral valve  regurgitation. No evidence of mitral stenosis.   4. The aortic valve was not well visualized. Aortic valve regurgitation  is not visualized. Mild to moderate aortic valve stenosis. Aortic valve  area, by VTI measures 1.16 cm. Aortic valve mean gradient measures 9.8  mmHg. Aortic valve Vmax measures 2.25   m/s. DVI is 0.26. SVI is 32.6.   5. The inferior vena cava is dilated in size with >50% respiratory  variability, suggesting right atrial pressure of 8  mmHg.   Comparison(s): No significant change from prior study.   Risk Assessment/Calculations:   {Does this patient have ATRIAL FIBRILLATION?[phone]} No BP recorded.  {Refresh Note OR Click here to enter BP  :1}***         Physical Exam:   VS:  There were no vitals taken for this visit.   Wt Readings from Last 3 Encounters:  11/18/23 166 lb 6.4 oz (75.5 kg)  10/30/23 162 lb (73.5 kg)   09/23/23 156 lb (70.8 kg)     GEN: Well nourished, well developed in no acute distress NECK: No JVD; No carotid bruits CARDIAC: ***RRR, no murmurs, rubs, gallops RESPIRATORY:  Clear to auscultation without rales, wheezing or rhonchi  ABDOMEN: Appears non-distended. No obvious abdominal masses. EXTREMITIES: No clubbing or cyanosis. No edema.  Distal pedal pulses are 2+ bilaterally.   Assessment and Plan:   1. Coronary artery disease involving native coronary artery of native heart without angina pectoris - Prior cardiac catheterization in 10/2021 showed moderate, nonobstructive disease as outlined above. ***  2. PAF (paroxysmal atrial fibrillation) (HCC) -He has a history of paroxysmal atrial fibrillation and did have recurrence in 10/2023 but spontaneously converted back to normal sinus rhythm while in the ED.  He has been on Flecainide  50 mg twice daily per EP and is also on Toprol -XL 50 mg twice daily. *** - Continue Eliquis  5 mg twice daily for anticoagulation which is the appropriate dose given his current age, weight and renal function.  3. Heart failure with mildly reduced ejection fraction (HFmrEF) (HCC) - Echocardiogram in 08/2023 showed his EF was at 45 to 50% which is overall similar to prior imaging. *** - Continue current medical therapy with Jardiance  10 mg daily, Toprol -XL 50 mg twice daily and lisinopril  10 mg daily.  Creatinine was stable at 0.93 when checked in 10/2023.  4. Aortic valve stenosis, etiology of cardiac valve disease unspecified -Aortic stenosis was in a mild to moderate range by recent echocardiogram in 08/2023.  5. Essential hypertension - BP is at *** during today's visit. ***  6. Hyperlipidemia LDL goal <70 - LDL was at 60 when checked in 10/2023.  Continue current medical therapy with atorvastatin  40 mg daily.  Signed, Laymon CHRISTELLA Qua, PA-C

## 2024-01-05 ENCOUNTER — Ambulatory Visit: Attending: Student | Admitting: Student

## 2024-01-05 DIAGNOSIS — E785 Hyperlipidemia, unspecified: Secondary | ICD-10-CM

## 2024-01-05 DIAGNOSIS — I350 Nonrheumatic aortic (valve) stenosis: Secondary | ICD-10-CM

## 2024-01-05 DIAGNOSIS — I2510 Atherosclerotic heart disease of native coronary artery without angina pectoris: Secondary | ICD-10-CM

## 2024-01-05 DIAGNOSIS — I480 Paroxysmal atrial fibrillation: Secondary | ICD-10-CM

## 2024-01-05 DIAGNOSIS — I5022 Chronic systolic (congestive) heart failure: Secondary | ICD-10-CM

## 2024-01-05 DIAGNOSIS — I10 Essential (primary) hypertension: Secondary | ICD-10-CM

## 2024-01-20 ENCOUNTER — Telehealth: Payer: Self-pay | Admitting: Orthopaedic Surgery

## 2024-01-20 NOTE — Telephone Encounter (Signed)
 Dr. Janae pt - pt lvm requesting a refill for Hydrocodone  7.5-325 to be sent to [facility] on [address]

## 2024-01-21 NOTE — Telephone Encounter (Signed)
 Samule, this patient is calling again, will you route to Dr. MARLA?

## 2024-01-22 MED ORDER — HYDROCODONE-ACETAMINOPHEN 7.5-325 MG PO TABS: 1.0000 | ORAL_TABLET | Freq: Four times a day (QID) | ORAL | 0 refills | Status: AC | PRN

## 2024-02-11 ENCOUNTER — Telehealth: Payer: Self-pay

## 2024-02-11 ENCOUNTER — Other Ambulatory Visit: Payer: Self-pay | Admitting: Orthopedic Surgery

## 2024-02-11 DIAGNOSIS — M25532 Pain in left wrist: Secondary | ICD-10-CM

## 2024-02-11 DIAGNOSIS — G8929 Other chronic pain: Secondary | ICD-10-CM

## 2024-02-11 MED ORDER — HYDROCODONE-ACETAMINOPHEN 5-325 MG PO TABS: 1.0000 | ORAL_TABLET | Freq: Four times a day (QID) | ORAL | 0 refills | Status: AC | PRN

## 2024-02-11 NOTE — Telephone Encounter (Signed)
 [facility] [address]  [phone]  We will send referral there, you call next week to schedule, they will call you too.   I gave him the number to call to schedule  He voiced understanding

## 2024-02-11 NOTE — Telephone Encounter (Signed)
 Hydrocodone -Acetaminophen  7.5/325 MG Qty 28 Tablets  Take 1 tablet by mouth every 6 (six) hours as needed for moderate pain (pain score 4-6).   PATIENT USES [facility] ON [address]

## 2024-02-12 DIAGNOSIS — E118 Type 2 diabetes mellitus with unspecified complications: Secondary | ICD-10-CM | POA: Diagnosis not present

## 2024-02-26 ENCOUNTER — Ambulatory Visit: Admitting: Orthopedic Surgery

## 2024-03-04 DIAGNOSIS — Z79899 Other long term (current) drug therapy: Secondary | ICD-10-CM | POA: Diagnosis not present

## 2024-03-04 DIAGNOSIS — M25512 Pain in left shoulder: Secondary | ICD-10-CM | POA: Diagnosis not present

## 2024-03-04 DIAGNOSIS — M129 Arthropathy, unspecified: Secondary | ICD-10-CM | POA: Diagnosis not present

## 2024-03-04 DIAGNOSIS — G8929 Other chronic pain: Secondary | ICD-10-CM | POA: Diagnosis not present

## 2024-03-07 ENCOUNTER — Encounter: Payer: Self-pay | Admitting: Orthopedic Surgery

## 2024-03-07 ENCOUNTER — Encounter: Payer: Self-pay | Admitting: Radiology

## 2024-03-07 ENCOUNTER — Ambulatory Visit: Admitting: Orthopedic Surgery

## 2024-03-07 DIAGNOSIS — G8929 Other chronic pain: Secondary | ICD-10-CM

## 2024-03-07 DIAGNOSIS — M75122 Complete rotator cuff tear or rupture of left shoulder, not specified as traumatic: Secondary | ICD-10-CM

## 2024-03-07 DIAGNOSIS — M19012 Primary osteoarthritis, left shoulder: Secondary | ICD-10-CM | POA: Diagnosis not present

## 2024-03-07 NOTE — Progress Notes (Signed)
 Chief Complaint  Patient presents with   Shoulder Pain    Left/ has gone to the pain clinic already at [facility] he is not a surgical candidate due to heart disease    Encounter Diagnoses  Name Primary?   Chronic left shoulder pain Yes   Complete tear of left rotator cuff, unspecified whether traumatic    Glenohumeral arthritis, left     63 year old male chronic shoulder pain rotator cuff tear with arthritis  He has seen all 3 physicians in this office  He has been established with the pain management center for his hydrocodone  he takes 1 daily  He has pain medication and 1 injection every 4 months seem to have helped him significantly and allowed him to function and work  He was told his ejection fraction is too low to have reverse total shoulder  He will come here every 4 months for injection

## 2024-03-07 NOTE — Progress Notes (Signed)
    03/07/2024   Chief Complaint  Patient presents with   Shoulder Pain    Left/ has gone to the pain clinic already at [facility] he is not a surgical candidate due to heart disease     No diagnosis found.  What pharmacy do you use ? ___[facility]________________________  DOI/DOS/ Date: ongoing  Did you get better, worse or no change (Answer below)   Unchanged  Has established at the pain clinic at [facility]

## 2024-03-18 ENCOUNTER — Ambulatory Visit: Attending: Cardiology | Admitting: Cardiology

## 2024-03-18 ENCOUNTER — Encounter: Payer: Self-pay | Admitting: Cardiology

## 2024-03-18 VITALS — BP 130/74 | HR 58 | Ht 71.0 in | Wt 168.0 lb

## 2024-03-18 DIAGNOSIS — I350 Nonrheumatic aortic (valve) stenosis: Secondary | ICD-10-CM | POA: Diagnosis not present

## 2024-03-18 DIAGNOSIS — E782 Mixed hyperlipidemia: Secondary | ICD-10-CM | POA: Diagnosis not present

## 2024-03-18 DIAGNOSIS — I493 Ventricular premature depolarization: Secondary | ICD-10-CM

## 2024-03-18 DIAGNOSIS — I480 Paroxysmal atrial fibrillation: Secondary | ICD-10-CM

## 2024-03-18 DIAGNOSIS — D6869 Other thrombophilia: Secondary | ICD-10-CM

## 2024-03-18 DIAGNOSIS — I5020 Unspecified systolic (congestive) heart failure: Secondary | ICD-10-CM

## 2024-03-18 NOTE — Progress Notes (Signed)
 Clinical Summary Mr. Varma is a 63 y.o.male seen today for follow up of the following medical problems.      1.PAF - diagnosed 06/2021 - multiple ER visits 2023 with afib with RVR - seen by EP, started on flecanide      EKG today shows sinus rhythm at 57.  - no recent palpitations - compliant with meds. No bleeding on eliquis .    - 10/2023 ER visit with afib elevated rates minimal symptoms - EKGs show SR and afib with RVR - no recent palpitations.Has never been symptomatic. Monitors his heart rhythm with his watch daily.  - compliant with meds. No bleeding on eliquis .      2. HTN - compliant with meds     3. Hyperlipidemia - 09/2021 TC 112 TG 154 HDL 27 LDL 58 - 09/2022 TC 884 TG 889 HDL 33 LDL 62 - - 10/2023 TC 121 TG 136 HDL 37 LDL 60     4. CAD - 10/2021 cath mild to moderate nonobstructive CAD -no chest pains     5. PVCs - dynamap HRs often inaccurate - followed by EP, has been on flecanide   - denies any recent symptoms   6.HFimpEF - echo 11/2021 LVEF 40-45%  08/2022 Echo LVEF 50-55%  - no SOB/DOE   7. Moderate Aortic stenosis - 11/2021 echo mean grad 18, AVA VTI 1.3 DI 0.33 - 08/2022 echo AVA VTI 1.29 mean grad 12 DI 0.29 SVI 39 - 08/2023 echo: LVEF 45-50%, no WMAs, grade I dd, mild to mod AS AVA VTI 1.16 mean grad 10 DI 0.26 SVI 32. Essentially moderate AS, lower than expected gradient due to low SVI.    8. Oropharyngeal cancer - followed by ENT, oncology, rad onc - reports in remission - down 40 lbs since 06/2021   9 Carotid stenosis - followed by vascualr 06/2023 mild bilateral disease Past Medical History:  Diagnosis Date   Agatston coronary artery calcium  score greater than 400    coronary calcium  score high at 1183 which is 96 percentile for age, race and sex matched controls.   Aortic atherosclerosis    Aortic stenosis    Carotid artery occlusion    COVID 06/28/2021   no symptoms, went to hospital with a-fib and diagnosed then    Diabetes mellitus without complication (HCC)    Dysrhythmia    A-fib   GERD (gastroesophageal reflux disease)    High cholesterol    History of kidney stones    Hypertension    Mild dilation of ascending aorta    by echo 07/2021 though not corroborated on CT   Mitral regurgitation    Neck mass    Abnormal neck CT 07/2021 concerning for nodal metastasis   PAF (paroxysmal atrial fibrillation) (HCC)    Pneumonia    Pulmonary nodules    PVC's (premature ventricular contractions)      No Known Allergies   Current Outpatient Medications  Medication Sig Dispense Refill   apixaban  (ELIQUIS ) 5 MG TABS tablet Take 5 mg by mouth 2 (two) times daily.     atorvastatin  (LIPITOR) 40 MG tablet TAKE 1 TABLET(40 MG) BY MOUTH DAILY 90 tablet 1   empagliflozin  (JARDIANCE ) 10 MG TABS tablet TAKE 1 TABLET(10 MG) BY MOUTH DAILY BEFORE BREAKFAST. Please call [phone] to schedule an appointment with Dr. Lurena Red for future refills. Thank you. 1st attempt. 30 tablet 0   esomeprazole (NEXIUM) 40 MG capsule Take 40 mg by mouth daily.  12   flecainide  (TAMBOCOR ) 50 MG tablet Take 1 tablet (50 mg total) by mouth 2 (two) times daily. Please keep scheduled appointment for future refills. Thank you. 60 tablet 0   glimepiride  (AMARYL ) 1 MG tablet Take 1 mg by mouth every evening.     HYDROcodone -acetaminophen  (NORCO) 7.5-325 MG tablet Take 1 tablet by mouth every 4 (four) hours as needed for up to 5 days. One tablet every four hours as needed for pain.  Five day supply for acute pain per [facility]. 30 tablet 0   HYDROcodone -acetaminophen  (NORCO) 7.5-325 MG tablet Take 1 tablet by mouth every 6 (six) hours as needed for up to 7 days. One every six hours as needed for pain.  Seven day limit 28 tablet 0   HYDROcodone -acetaminophen  (NORCO) 7.5-325 MG tablet Take 1 tablet by mouth every 6 (six) hours as needed for moderate pain (pain score 4-6). 28 tablet 0   HYDROcodone -acetaminophen  (NORCO/VICODIN) 5-325 MG  tablet Take 1 tablet by mouth every 6 (six) hours as needed for up to 7 days for moderate pain. 28 tablet 0   HYDROcodone -acetaminophen  (NORCO/VICODIN) 5-325 MG tablet Take 1 tablet by mouth every 6 (six) hours as needed for up to 7 days for moderate pain (pain score 4-6). 28 tablet 0   lisinopril  (ZESTRIL ) 10 MG tablet Take 1 tablet (10 mg total) by mouth daily. 30 tablet 1   metFORMIN  (GLUCOPHAGE ) 1000 MG tablet Take 1,000 mg by mouth 2 (two) times daily.  12   metoprolol  succinate (TOPROL -XL) 50 MG 24 hr tablet Take 1 tablet (50 mg total) by mouth in the morning and at bedtime. Take with or immediately following a meal. 60 tablet 0   Multiple Vitamin (MULTIVITAMIN WITH MINERALS) TABS tablet Take 1 tablet by mouth daily.     Current Facility-Administered Medications  Medication Dose Route Frequency Provider Last Rate Last Admin   sodium chloride  flush (NS) 0.9 % injection 3 mL  3 mL Intravenous Q12H Parthenia Olivia HERO, PA-C         Past Surgical History:  Procedure Laterality Date   BACK SURGERY     BACK SURGERY     lumbar   LARYNGOSCOPY AND ESOPHAGOSCOPY N/A 10/30/2021   Procedure: DIRECT LARYNGOSCOPY WITH BIOPSY; ESOPHAGOSCOPY;  Surgeon: Jesus Oliphant, MD;  Location: [facility] OR;  Service: ENT;  Laterality: N/A;   LEFT HEART CATH AND CORONARY ANGIOGRAPHY N/A 10/09/2021   Procedure: LEFT HEART CATH AND CORONARY ANGIOGRAPHY;  Surgeon: Verlin Lonni BIRCH, MD;  Location: [facility] INVASIVE CV LAB;  Service: Cardiovascular;  Laterality: N/A;   TONSILLECTOMY Bilateral 10/30/2021   Procedure: TONSILLECTOMY; FROZEN SECTION;  Surgeon: Jesus Oliphant, MD;  Location: [facility] OR;  Service: ENT;  Laterality: Bilateral;     No Known Allergies    Family History  Problem Relation Age of Onset   Coronary artery disease Mother    Coronary artery disease Father    Mesothelioma Father    CAD Neg Hx        negative for premature CAD     Social History Mr. Oyen reports that he has quit smoking. His smoking use  included cigarettes. He uses smokeless tobacco. Mr. Villena reports that he does not currently use alcohol.    Physical Examination Today's Vitals   03/18/24 0923  BP: 130/74  Pulse: (!) 58  SpO2: 98%  Weight: 168 lb (76.2 kg)  Height: 5' 11 (1.803 m)   Body mass index is 23.43 kg/m.  Gen: resting comfortably, no  acute distress HEENT: no scleral icterus, pupils equal round and reactive, no palptable cervical adenopathy,  CV: RRR, 2/6 systolic murmur rusb, no jvd Resp: Clear to auscultation bilaterally GI: abdomen is soft, non-tender, non-distended, normal bowel sounds, no hepatosplenomegaly MSK: extremities are warm, no edema.  Skin: warm, no rash Neuro:  no focal deficits Psych: appropriate affect   Diagnostic Studies  10/2021 nuclear stress Poor quality study, significant extracardiac activity Severe LV systolic dysfunction, EF 23%.  Patient was having frequent PVCs however which can affect gating Fixed inferior perfusion defect with abnormal wall motion consistent with prior infarct Partially reversible anterior perfusion defect with abnormal wall motion suggesting infarct with peri-infarct ischemia High risk study   07/2021 echo 1. Left ventricular ejection fraction, by estimation, is 45 to 50%. The  left ventricle has mildly decreased function. The left ventricle  demonstrates global hypokinesis. Left ventricular diastolic parameters are  consistent with Grade I diastolic  dysfunction (impaired relaxation).   2. Right ventricular systolic function is mildly reduced. The right  ventricular size is mildly enlarged. Tricuspid regurgitation signal is  inadequate for assessing PA pressure.   3. Left atrial size was mildly dilated.   4. The mitral valve is normal in structure. Mild mitral valve  regurgitation. No evidence of mitral stenosis.   5. The aortic valve is normal in structure. Aortic valve regurgitation is  mild. Mild to moderate aortic valve stenosis.  Aortic valve area, by VTI  measures 1.73 cm. Aortic valve mean gradient measures 18.0 mmHg. Aortic  valve Vmax measures 2.82 m/s.   6. There is mild dilatation of the ascending aorta, measuring 38 mm.   7. The inferior vena cava is normal in size with greater than 50%  respiratory variability, suggesting right atrial pressure of 3 mmHg.        11/2021 echo 1. Wall motion assessment/LVEF challenging with frequent PVCs.. Left  ventricular ejection fraction, by estimation, is 40 to 45%. The left  ventricle has mildly decreased function. The left ventricle demonstrates  global hypokinesis. The left ventricular  internal cavity size was mildly dilated. Left ventricular diastolic  parameters are indeterminate.   2. Right ventricular systolic function is mildly reduced. The right  ventricular size is mildly enlarged. There is normal pulmonary artery  systolic pressure.   3. The mitral valve is normal in structure. No evidence of mitral valve  regurgitation.   4. The aortic valve is calcified. Aortic valve regurgitation is trivial.  Mild to moderate aortic valve stenosis.   5. The inferior vena cava is normal in size with greater than 50%  respiratory variability, suggesting right atrial pressure of 3 mmHg.    10/2021 cath Prox RCA lesion is 30% stenosed.   Prox Cx to Mid Cx lesion is 20% stenosed.   Mid LAD-1 lesion is 50% stenosed.   Mid LAD-2 lesion is 40% stenosed.   Dist LAD lesion is 30% stenosed.   Moderate non-obstructive mid LAD stenosis. This does not appear to be flow limiting.  Mild non-obstructive disease in the Circumflex and RCA   Recommendations: medical management of non-obstructive CAD     08/2022 echo 1. Left ventricular ejection fraction, by estimation, is 50 to 55%. The  left ventricle has low normal function. The left ventricle has no regional  wall motion abnormalities. Left ventricular diastolic parameters are  indeterminate.   2. Right ventricular systolic  function is normal. The right ventricular  size is normal. Tricuspid regurgitation signal is inadequate for assessing  PA  pressure.   3. Left atrial size was mildly dilated.   4. The mitral valve is grossly normal. No evidence of mitral valve  regurgitation. No evidence of mitral stenosis.   5. The aortic valve is calcified. There is moderate calcification of the  aortic valve. Aortic valve regurgitation is trivial. Mild to moderate  aortic valve stenosis. Aortic valve area, by VTI measures 1.29 cm. Aortic  valve mean gradient measures 12.0  mmHg. Aortic valve Vmax measures 2.62 m/s. DVI is 0.26.   6. The inferior vena cava is normal in size with greater than 50%  respiratory variability, suggesting right atrial pressure of 3 mmHg.    Assessment and Plan   1.Afib/acquired thrombophilia - monitors afib on his watch daily. Isolated episodes in June at the time of ER evaluation, no recurrences since - continue current meds including eliquis  for stroke prevention   2. Nonobstructive CAD -no symptoms, continue current meds   3. HFimpEF - LVEF has essentially normalized, no symptoms -he will continue curren tmeds   4. PVCs - denies symptoms, continue current meds   5. Hyperlipidemia - lipids at goal, continue current meds   6. Aortic stenosis - essentially moderate AS by most recent echo, lower than expected gradient due to low SVI - repeat echo next year      Dorn PHEBE Ross, M.D.

## 2024-03-18 NOTE — Patient Instructions (Signed)
 Medication Instructions:  Continue all current medications.   Labwork: none  Testing/Procedures: none  Follow-Up: 6 months   Any Other Special Instructions Will Be Listed Below (If Applicable).   If you need a refill on your cardiac medications before your next appointment, please call your pharmacy.

## 2024-03-25 ENCOUNTER — Ambulatory Visit: Admitting: Cardiology

## 2024-04-04 ENCOUNTER — Ambulatory Visit: Admitting: Orthopedic Surgery

## 2024-04-04 DIAGNOSIS — Z79899 Other long term (current) drug therapy: Secondary | ICD-10-CM | POA: Diagnosis not present

## 2024-04-04 DIAGNOSIS — M25512 Pain in left shoulder: Secondary | ICD-10-CM | POA: Diagnosis not present

## 2024-04-04 DIAGNOSIS — G8929 Other chronic pain: Secondary | ICD-10-CM | POA: Diagnosis not present

## 2024-04-08 ENCOUNTER — Encounter: Payer: Self-pay | Admitting: Orthopedic Surgery

## 2024-04-08 ENCOUNTER — Ambulatory Visit: Admitting: Orthopedic Surgery

## 2024-04-08 DIAGNOSIS — G8929 Other chronic pain: Secondary | ICD-10-CM

## 2024-04-08 DIAGNOSIS — M75122 Complete rotator cuff tear or rupture of left shoulder, not specified as traumatic: Secondary | ICD-10-CM

## 2024-04-08 DIAGNOSIS — M19012 Primary osteoarthritis, left shoulder: Secondary | ICD-10-CM

## 2024-04-08 DIAGNOSIS — M25512 Pain in left shoulder: Secondary | ICD-10-CM | POA: Diagnosis not present

## 2024-04-08 MED ORDER — METHYLPREDNISOLONE ACETATE 40 MG/ML IJ SUSP
40.0000 mg | Freq: Once | INTRAMUSCULAR | Status: AC
  Administered 2024-04-08: 40 mg via INTRA_ARTICULAR

## 2024-04-08 NOTE — Progress Notes (Signed)
   Procedure note for injection   Chief Complaint  Patient presents with   Injections    Left shoulder      Encounter Diagnoses  Name Primary?   Chronic left shoulder pain Yes   Complete tear of left rotator cuff, unspecified whether traumatic    Glenohumeral arthritis, left       Despite discussing the possibility of surgery to repair shoulder the patient prefers to continue to get injections  The patient has consented for injection of the left Joint: shoulder subacromial space  Medication: Depo-Medrol  40 mg and lidocaine  1%  Time out completed: Yes  The site of injection was cleaned with alcohol and ethyl chloride.  The injection was given without any complications appropriate precautions were given.

## 2024-04-12 DIAGNOSIS — C109 Malignant neoplasm of oropharynx, unspecified: Secondary | ICD-10-CM | POA: Diagnosis not present

## 2024-04-12 DIAGNOSIS — Z923 Personal history of irradiation: Secondary | ICD-10-CM | POA: Diagnosis not present

## 2024-08-12 ENCOUNTER — Ambulatory Visit: Admitting: Orthopedic Surgery
